# Patient Record
Sex: Female | Born: 1935 | Race: Black or African American | Hispanic: No | Smoking: Former smoker
Health system: Southern US, Community
[De-identification: ages and names within clinical notes are randomized; demographics above are authoritative.]

## PROBLEM LIST (undated history)

## (undated) DIAGNOSIS — Z992 Dependence on renal dialysis: Secondary | ICD-10-CM

## (undated) DIAGNOSIS — E785 Hyperlipidemia, unspecified: Secondary | ICD-10-CM

## (undated) DIAGNOSIS — N186 End stage renal disease: Secondary | ICD-10-CM

## (undated) DIAGNOSIS — K5900 Constipation, unspecified: Secondary | ICD-10-CM

## (undated) DIAGNOSIS — I509 Heart failure, unspecified: Secondary | ICD-10-CM

## (undated) DIAGNOSIS — I10 Essential (primary) hypertension: Secondary | ICD-10-CM

## (undated) DIAGNOSIS — J449 Chronic obstructive pulmonary disease, unspecified: Secondary | ICD-10-CM

## (undated) DIAGNOSIS — D649 Anemia, unspecified: Secondary | ICD-10-CM

## (undated) DIAGNOSIS — K219 Gastro-esophageal reflux disease without esophagitis: Secondary | ICD-10-CM

## (undated) DIAGNOSIS — I469 Cardiac arrest, cause unspecified: Secondary | ICD-10-CM

## (undated) HISTORY — DX: Anemia, unspecified: D64.9

## (undated) HISTORY — DX: Essential (primary) hypertension: I10

## (undated) HISTORY — PX: ABDOMINAL HYSTERECTOMY: SHX81

## (undated) HISTORY — DX: Hyperlipidemia, unspecified: E78.5

## (undated) HISTORY — DX: Chronic obstructive pulmonary disease, unspecified: J44.9

---

## 2008-03-31 ENCOUNTER — Ambulatory Visit: Payer: Self-pay | Admitting: *Deleted

## 2008-07-12 ENCOUNTER — Ambulatory Visit: Payer: Self-pay | Admitting: Internal Medicine

## 2008-07-12 DIAGNOSIS — I10 Essential (primary) hypertension: Secondary | ICD-10-CM

## 2008-07-12 DIAGNOSIS — J4489 Other specified chronic obstructive pulmonary disease: Secondary | ICD-10-CM | POA: Insufficient documentation

## 2008-07-12 DIAGNOSIS — R042 Hemoptysis: Secondary | ICD-10-CM | POA: Insufficient documentation

## 2008-07-12 DIAGNOSIS — J438 Other emphysema: Secondary | ICD-10-CM | POA: Insufficient documentation

## 2008-07-12 DIAGNOSIS — J449 Chronic obstructive pulmonary disease, unspecified: Secondary | ICD-10-CM

## 2008-08-09 ENCOUNTER — Ambulatory Visit: Payer: Self-pay | Admitting: Internal Medicine

## 2008-09-08 ENCOUNTER — Ambulatory Visit: Payer: Self-pay | Admitting: *Deleted

## 2008-10-11 ENCOUNTER — Ambulatory Visit: Payer: Self-pay | Admitting: Internal Medicine

## 2008-10-11 DIAGNOSIS — R0609 Other forms of dyspnea: Secondary | ICD-10-CM

## 2008-10-11 DIAGNOSIS — R0989 Other specified symptoms and signs involving the circulatory and respiratory systems: Secondary | ICD-10-CM

## 2008-10-11 LAB — CONVERTED CEMR LAB
BUN: 21 mg/dL (ref 6–23)
Basophils Absolute: 0 10*3/uL (ref 0.0–0.1)
Basophils Relative: 0 % (ref 0.0–3.0)
CO2: 38 meq/L — ABNORMAL HIGH (ref 19–32)
Calcium: 9.5 mg/dL (ref 8.4–10.5)
Chloride: 103 meq/L (ref 96–112)
Creatinine, Ser: 1.3 mg/dL — ABNORMAL HIGH (ref 0.4–1.2)
Eosinophils Absolute: 0.1 10*3/uL (ref 0.0–0.7)
GFR calc Af Amer: 51 mL/min
GFR calc non Af Amer: 42 mL/min
Glucose, Bld: 111 mg/dL — ABNORMAL HIGH (ref 70–99)
HCT: 36.6 % (ref 36.0–46.0)
Hemoglobin: 12 g/dL (ref 12.0–15.0)
Lymphocytes Relative: 35.8 % (ref 12.0–46.0)
MCHC: 32.9 g/dL (ref 30.0–36.0)
MCV: 90.5 fL (ref 78.0–100.0)
Monocytes Relative: 7.7 % (ref 3.0–12.0)
Platelets: 174 10*3/uL (ref 150–400)
Potassium: 4.5 meq/L (ref 3.5–5.1)
RBC: 4.05 M/uL (ref 3.87–5.11)
Sodium: 144 meq/L (ref 135–145)
TSH: 0.67 microintl units/mL (ref 0.35–5.50)

## 2009-03-02 ENCOUNTER — Ambulatory Visit: Payer: Self-pay | Admitting: *Deleted

## 2009-09-14 ENCOUNTER — Ambulatory Visit: Payer: Self-pay | Admitting: Vascular Surgery

## 2010-03-09 ENCOUNTER — Ambulatory Visit: Payer: Self-pay | Admitting: Vascular Surgery

## 2010-03-09 ENCOUNTER — Encounter: Payer: Self-pay | Admitting: Internal Medicine

## 2010-03-16 ENCOUNTER — Encounter: Payer: Self-pay | Admitting: Internal Medicine

## 2010-03-16 ENCOUNTER — Encounter: Admission: RE | Admit: 2010-03-16 | Discharge: 2010-03-16 | Payer: Self-pay | Admitting: Vascular Surgery

## 2010-03-16 ENCOUNTER — Ambulatory Visit: Payer: Self-pay | Admitting: Vascular Surgery

## 2010-10-02 ENCOUNTER — Ambulatory Visit
Admission: RE | Admit: 2010-10-02 | Discharge: 2010-10-02 | Payer: Self-pay | Source: Home / Self Care | Attending: Vascular Surgery | Admitting: Vascular Surgery

## 2010-10-02 ENCOUNTER — Ambulatory Visit: Admit: 2010-10-02 | Payer: Self-pay | Admitting: Vascular Surgery

## 2010-10-23 NOTE — Letter (Signed)
Summary: [facility]   Imported By: Sherian Rein 03/22/2010 16:03:16  _____________________________________________________________________  External Attachment:    Type:   Image     Comment:   External Document

## 2010-10-23 NOTE — Letter (Signed)
Summary: [facility]  [facility] [place]   Imported By: Sherian Rein 04/03/2010 09:53:24  _____________________________________________________________________  External Attachment:    Type:   Image     Comment:   External Document

## 2011-02-05 NOTE — Procedures (Signed)
DUPLEX ULTRASOUND OF ABDOMINAL AORTA   INDICATION:  Abdominal aortic aneurysm.   HISTORY:  Diabetes:  No  Cardiac:  No  Hypertension:  Yes  Smoking:  Previous  Connective Tissue Disorder:  Family History:  Yes  Previous Surgery:  No   DUPLEX EXAM:         AP (cm)                   TRANSVERSE (cm)  Proximal             2.83 cm                   2.80 cm  Mid                  Not visualized            Not visualized  Distal               5.24 cm                   5.66 cm  Right Iliac          Not visualized            Not visualized  Left Iliac           Not visualized            Not visualized   PREVIOUS:  Date:  March 09, 2010  AP:  5.3  TRANSVERSE:  5.6   IMPRESSION:  1. Aneurysmal dilatation of the distal abdominal aorta.  Noted was a      measurement of 5.24 x 5.66 cm.  2. Unable to adequately visualize the mid-abdominal aorta and common      iliac arteries due to bowel gas and patient body habitus.   ___________________________________________  Larina Earthly, M.D.   EM/MEDQ  D:  10/02/2010  T:  10/02/2010  Job:  [id number]

## 2011-02-05 NOTE — Procedures (Signed)
DUPLEX ULTRASOUND OF ABDOMINAL AORTA   INDICATION:  Followup abdominal aortic aneurysm.   HISTORY:  Diabetes:  No.  Cardiac:  No.  Hypertension:  Yes.  Smoking:  No.  Connective Tissue Disorder:  Family History:  Previous Surgery:   DUPLEX EXAM:         AP (cm)                   TRANSVERSE (cm)  Proximal             3.18 cm                   3.14 cm  Mid                  4.33 cm                   4.37 cm  Distal               3.11 cm                   3.06 cm  Right Iliac          1.39 cm                   1.59 cm  Left Iliac           1.39 cm                   1.49 cm   PREVIOUS:  Date:  AP:  4.5  TRANSVERSE:  4.2 (per CT)   IMPRESSION:  Abdominal aortic aneurysm noted with the largest  measurement of 4.33 cm x 4.37 cm.   ___________________________________________  P. Liliane Bade, M.D.   MG/MEDQ  D:  09/08/2008  T:  09/08/2008  Job:  [id number]

## 2011-02-05 NOTE — Assessment & Plan Note (Signed)
OFFICE VISIT   [name], [name]  DOB:  05/24/1929                                       09/08/2008  ZOXWR#:[id number]   The patient has a stable abdominal aortic aneurysm measuring 4.3 cm in  maximal diameter.  This has not changed over the past 6 months.   Note is made of a CT report with a left lower quadrant cyst measuring  3.7 cm by 2.6 cm.  The patient is asked to follow up with Dr. Abner Greenspan regarding this.  Otherwise, follow-up with [place] in 6 months with  repeat abdominal ultrasound.   Balinda Quails, M.D.  Electronically Signed   PGH/MEDQ  D:  09/08/2008  T:  09/09/2008  Job:  [id number]   cc:   Grant Fontana, MD

## 2011-02-05 NOTE — Procedures (Signed)
DUPLEX ULTRASOUND OF ABDOMINAL AORTA   INDICATION:  Abdominal aortic aneurysm.   HISTORY:  Diabetes:  No.  Cardiac:  No.  Hypertension:  Yes.  Smoking:  Previous.  Connective Tissue Disorder:  Family History:  No.  Previous Surgery:  No.   DUPLEX EXAM:         AP (cm)                   TRANSVERSE (cm)  Proximal             3.0 cm                    3.3 cm  Mid                  2.3 cm                    2.5 cm  Distal               5.3 cm                    5.6 cm  Right Iliac          Not visualized            Not visualized  Left Iliac           Not visualized            Not visualized   PREVIOUS:  Date: 09/14/2009  AP:  4.87  TRANSVERSE:  4.76   IMPRESSION:  1. Aneurysmal dilatation of the distal abdominal aorta noted with a      significant increase in maximum diameter when compared to the      previous examination.  2. Unable to adequately visualize the bilateral common iliac arteries      due to overlying bowel gas patterns and patient body habitus.   ___________________________________________  Larina Earthly, M.D.   CH/MEDQ  D:  03/09/2010  T:  03/09/2010  Job:  [id number]

## 2011-02-05 NOTE — Assessment & Plan Note (Signed)
OFFICE VISIT   [name], [name]  DOB:  Dec 02, 1928                                       03/09/2010  [id number]#:[id number]   The patient presents today for continued followup of her infrarenal  abdominal aortic aneurysm.  She had seen Dr. Liliane Bade for evaluation  of this initially in July of 2009 with one subsequent followup visit in  December of 2009.  I explained to the patient that Dr. Madilyn Fireman has left  our practice and I will assume her care.  She underwent ultrasound today  for continued evaluation of her infrarenal abdominal aortic aneurysm.  She does not have any symptoms referable to her aneurysm.  She does have  severe pulmonary compromise on continuous oxygen therapy.  She does not  have any known history of coronary artery disease.  She does have  hypertension, hyperlipidemia, COPD and a history of anemia.   Her social history is that she is widowed with three children.  She quit  smoking in 2007.  Does not drink alcohol on a regular basis.   Family history is positive for myocardial infarction in her mother.   REVIEW OF SYSTEMS:  Reports weight gain.  She is 5 feet 4 inches and  weighs 223 pounds.  VASCULAR:  Pain in her feet when lying flat.  She does have shortness of  breath with exertion and lying flat.  GU:  Positive for reflux.  NEUROLOGIC:  Negative.  PULMONARY:  Home oxygen and wheezing.  HEMATOLOGIC:  Negative.  URINARY:  Frequent urination.  HEENT:  Negative.  MUSCULOSKELETAL:  Positive for arthritis, joint pain, muscle pain.  PSYCHIATRIC:  Negative.   PHYSICAL EXAMINATION:  General:  An obese black female appearing her  stated age in no acute distress.  Vital signs:  Blood pressure is  114/65, pulse 70, respirations 18.  Her O2 saturations are 98% on  supplemental oxygen.  HEENT:  Normal.  Chest:  Is clear bilaterally.  Heart:  Regular rate and rhythm.  Carotid arteries without bruits  bilaterally.  She has palpable radial and palpable  femoral pulses.  She  has absent popliteal pulses bilaterally.  Abdomen:  Her abdomen is  obese.  I do not feel an aneurysm.  Musculoskeletal:  Shows no deformity  or cyanosis.  Neurological:  No focal weakness, paresthesias.  Skin:  Without ulcers or rashes.   She underwent noninvasive vascular laboratory studies in our office  today with repeat ultrasound.  This shows progression from her aneurysm  size up to 5.6 significantly larger than her most recent December which  was 4.8.  I had a long discussion with the patient, her daughter and  friend present.  I explained that this is quite concerning both in the  rapidity of growth and the fact that it has reached a size of over 5.5  cm.  I did review her CT scan from several years ago and at least at  that time she appeared to be a candidate for stent grafting.  I  explained that she would need a repeat CT angiogram to further evaluate  her aorta to determine if she is indeed this size and if so would  recommend elective repair.  I explained that we would have her see Dr.  Sandrea Hughs again for pulmonary risk stratification and optimization  prior to planned surgery and  that she would also require cardiac  evaluation to rule out silent cardiac disease.  We will see her again  with a CT scan for further discussion and planning.  She does have  claudication symptoms and apparently underwent what looks like a prior  left fem-pop bypass several years ago at [facility].  We will  also check her ankle arm indices with her next visit.     [name], M.D.  Electronically Signed   TFE/MEDQ  D:  03/09/2010  T:  03/09/2010  Job:  [id number]   cc:   Dr Molli Knock B. Sherene Sires, MD, FCCP

## 2011-02-05 NOTE — Assessment & Plan Note (Signed)
OFFICE VISIT   [name], [name]  DOB:  [birth year]                                       10/02/2010  [id number]#:[id number]   Patient presents today for continued follow-up of her infrarenal  abdominal aortic aneurysm.  She has been quite stable since my last  visit with her.  She does have chronic shortness of breath, even at  rest, and this is no change.  She is not [name] current smoker.  She does  have hypertension.  Review of systems is otherwise unchanged.   PHYSICAL EXAMINATION:  [name] well-developed, moderately obese black female  in no acute distress.  Blood pressure is 132/66, pulse 76, respirations  24.  Her radial and dorsalis pedis pulses are 2+ bilaterally.  Abdominal  exam reveals obesity, and I feel the aneurysm.  Femoral pulses are 2+.   She underwent repeat noninvasive vascular laboratory studies in our  office.  This reveals [name] somewhat technically difficult study due to body  habitus and bowel gas.  Her predicted maximal diameter is 5.6 cm, which  is unchanged from her ultrasound study in June 2011.  At that time she  underwent [name] CT scan, and this revealed tortuosity and actual diameter of  4.28 cm.   I discussed the significance of this with patient and her friend  present.  I explained that since her ultrasound evaluation is the same  as it had been with [name] CT showing [name] less-than 5-cm aneurysm, I would  recommend continued close follow-up.  She understands the symptoms of  leaking aneurysm and knows to report immediately to the emergency room  should this occur, otherwise we will see her in 6 months for repeat  ultrasound.     Larina Earthly, M.D.  Electronically Signed   TFE/MEDQ  D:  10/02/2010  T:  10/02/2010  Job:  [id number]   cc:   Dr. [name]

## 2011-02-05 NOTE — Procedures (Signed)
DUPLEX ULTRASOUND OF ABDOMINAL AORTA   INDICATION:  Followup abdominal aortic aneurysm.   HISTORY:  Diabetes:  No.  Cardiac:  No.  Hypertension:  Yes.  Smoking:  Previous.  Connective Tissue Disorder:  Family History:  No.  Previous Surgery:  No.   DUPLEX EXAM:         AP (cm)                   TRANSVERSE (cm)  Proximal             2.60 cm  Mid                  4.87 cm                   4.76 cm  Distal               3.11 cm  Right Iliac          1.29 cm  Left Iliac           1.33 cm   PREVIOUS:  Date:  03/02/2009  AP:  4.6  TRANSVERSE:  4.7   IMPRESSION:  1. Stable abdominal aortic aneurysm with largest measurement of 4.87      cm x 4.76 cm.  2. Limited visualization due to body habitus and bowel gas.   ___________________________________________  Larina Earthly, M.D.   AS/MEDQ  D:  09/14/2009  T:  09/14/2009  Job:  [id number]

## 2011-02-05 NOTE — Consult Note (Signed)
VASCULAR SURGERY CONSULTATION   [name], [name]  DOB:  11/19/1928                                       03/31/2008  EAVWU#:[id number]   REASON FOR CONSULTATION:  Abdominal aortic aneurysm.   HISTORY:  The patient is a 77 year old female who is referred for  evaluation of an abdominal aortic aneurysm.  This was discovered at the  time of CT scan carried out of the left lower quadrant for abdominal  pain associated with nausea.  CT revealed a 4.5 x 4.2 infrarenal  abdominal aortic aneurysm.  Also noted in the pelvis was a left lower  quadrant cystic lesion measuring 3.7 x 2.6 cm.   The patient denies pain at this time.  No back pain.  No abdominal pain.   Risk factors for aneurysm disease include hypertension and tobacco  abuse.   PAST MEDICAL HISTORY:  1. Hypertension.  2. Hyperlipidemia.  3. COPD.  4. Anemia.   MEDICATIONS:  Plavix 75 mg daily, Lipitor 20 mg daily, quinapril  hydrochlorothiazide 20/12.5 one tablet daily, Lasix 20 mg daily,  theophylline 200 mg b.i.d. aspirin 81 mg daily, Pulmicort 0.5 mg b.i.d.,  colchicine 0.6 mg t.i.d., ProAir HFA 90 mcg 2 puffs q.6 h, and albuterol  inhaler p.r.n.   ALLERGIES:  None known.   SOCIAL HISTORY:  The patient is widowed.  She has 2 children and an  adopted child.  She is retired from a Museum/gallery curator.  Does not smoke  at present.  Did smoke a half a pack of cigarettes for up to 45 years.  Discontinued tobacco use in 2006.  Is now on home supplemental oxygen.  No regular alcohol use.   FAMILY HISTORY:  Mother died at age 104 of coronary artery disease and an  MI.  Father died in his 30s from alcohol abuse with cirrhosis.  One  sister in her 85s has had a stroke.   REVIEW OF SYSTEMS:  Refer to patient encounter form.  The patient has  had some recent weight loss.  Has a history of home oxygen use chronic  bronchitis and wheezing.  Recurrent abdominal pain.  Urinary frequency.  Joint discomfort.   PHYSICAL  EXAM:  An obese 77 year old African American female.  Supplemental oxygen.  No acute distress.  Alert and oriented.  Vital  Signs:  BP 140/82, pulse 78 per minute, respirations 20 per minute.  HEENT:  Mouth and throat are clear.  Normocephalic.  Extraocular  movements intact.  Neck:  Supple.  No thyromegaly or adenopathy.  Cardiovascular:  No carotid bruits.  Normal heart sounds without  murmurs.  No gallops or rubs.  Regular rate and rhythm.  Chest:  Equal  air entry bilaterally.  Scattered wheeze.  No rales.  Distant breath  sounds.  Abdomen:  Soft, nontender.  No masses or organomegaly.  Normal  bowel sounds without bruits.  Lower Extremities:  2+ femoral pulses.  No  popliteal, posterior tibial, or dorsalis pedis pulses.  No ankle edema.  Full range of motion.  Neurologic:  Cranial nerves intact.  Strength  equal bilaterally.  1+ reflexes.  Skin:  Warm, dry, intact.  No  ulceration or rash.   IMPRESSION:  1. A 4.5 cm abdominal aortic aneurysm.  2. Cystic abdominal mass 3.7 x 2.6 cm.  3. Chronic obstructive pulmonary disease.  4. Hypertension.  5. Hyperlipidemia.  6. Anemia.   RECOMMENDATIONS:  The patient has a moderate-sized abdominal aortic  aneurysm, which is asymptomatic.  Will require followup in 6 months with  an ultrasound.   Recommend follow up for intra-abdominal cyst with CT scan in 6 months  per primary care.   Continue current regimen for chronic medical conditions.   INVESTIGATIONS:  CT scan reveals.   Balinda Quails, M.D.  Electronically Signed  PGH/MEDQ  D:  03/31/2008  T:  04/01/2008  Job:  [id number]

## 2011-02-05 NOTE — Procedures (Signed)
DUPLEX ULTRASOUND OF ABDOMINAL AORTA   INDICATION:  Followup evaluation of known abdominal aortic aneurysm.   HISTORY:  Diabetes:  No.  Cardiac:  No.  Hypertension:  Yes.  Smoking:  No.  Connective Tissue Disorder:  Family History:  Previous Surgery:   DUPLEX EXAM:         AP (cm)                   TRANSVERSE (cm)  Proximal             2.7 cm                    2.7 cm  Mid                  4.6 cm                    4.7 cm  Distal               2.9 cm                    3.3 cm  Right Iliac          1.3 cm                    1.4 cm  Left Iliac           0.8 cm                    1.4 cm   PREVIOUS:  Date:  AP:  4.3  TRANSVERSE:  4.4   IMPRESSION:  Abdominal aortic aneurysm is stable.   ___________________________________________  P. Liliane Bade, M.D.   MC/MEDQ  D:  03/02/2009  T:  03/02/2009  Job:  [id number]

## 2011-02-05 NOTE — Assessment & Plan Note (Signed)
OFFICE VISIT   Mccalister, [name]  DOB:  1929/04/18                                       03/16/2010  BJYNW#:[id number]   Patient presents today for discussion regarding her CT scan for further  evaluation of abdominal aortic aneurysm.  I had seen her in follow-up in  our office on 06/17.  Ultrasound at that time suggested a significant  increase in the size of her aneurysm.  She has undergone a CT scan today  for further evaluation of this.   She has had no change in her physical exam.  She is an obese black  female with shortness of breath on oxygen therapy.  Blood pressure today  is 123/60, pulse 95, respirations 18.  Her saturations are 91% on  supplemental oxygen. Abdominal exam reveals obesity and tenderness.   I have reviewed her CAT scan and discussed this with patient and her  friend present.  She does have a great deal of tortuosity in her  infrarenal aorta.  She has a long segment of aorta that is not  aneurysmal and then has an aneurysm that extends down to the aortic  bifurcation.  Due to the tortuosity, axial cuts overestimate her level  of aneurysmal dilatation due to taking these measurements on a skew.  In  looking at her sagittal, coronal, and reformatted images, it is clear  that her maximal diameter is approximately 4.8 cm.   I discussed this at length with the patient and her friend present.  I  explained that this is certainly below the threshold of where we would  recommend any intervention and do not see that this is representing a  rapid enlargement.  She was reassured with this, and we will see her  again with ultrasound in 1 year.  I did explain that due to do her body  habitus and tortuosity, ultrasound is somewhat difficult, but I feel  that it is appropriate to follow this with ultrasound and would repeat a  CT scan only if there appeared to be a large change in her aneurysm  size.     Larina Earthly, M.D.  Electronically  Signed   TFE/MEDQ  D:  03/16/2010  T:  03/16/2010  Job:  [id number]   cc:   Dr. Molli Knock B. Sherene Sires, MD, FCCP

## 2011-03-26 ENCOUNTER — Ambulatory Visit (INDEPENDENT_AMBULATORY_CARE_PROVIDER_SITE_OTHER): Payer: Medicare Other | Admitting: Vascular Surgery

## 2011-03-26 ENCOUNTER — Encounter (INDEPENDENT_AMBULATORY_CARE_PROVIDER_SITE_OTHER): Payer: Medicare Other

## 2011-03-26 DIAGNOSIS — I714 Abdominal aortic aneurysm, without rupture, unspecified: Secondary | ICD-10-CM

## 2011-03-26 NOTE — Assessment & Plan Note (Signed)
OFFICE VISIT  [name], [name] DOB:  02/08/1929                                       03/26/2011 ZOXWR#:[id number]  The patient presents today for continued followup of her infrarenal abdominal aortic aneurysm.  She is here with her daughter and a family friend.  She does have severe pulmonary dysfunction on continuous oxygen therapy.  She was hospitalized several months ago with pulmonary deterioration and had rehab stay after this.  She reports that she feels she is back to her baseline.  She does not have any symptoms referable to the aneurysm and has had no other major medical difficulties.  PHYSICAL EXAM:  General:  Today she is a well-developed, well-nourished, alert black female in no acute distress.  Vital signs:  Blood pressure is 149/91, pulse 79, respirations 22.  Abdomen:  Reveals obesity.  I do not feel her aneurysm.  She does have palpable femoral pulses bilaterally.  She underwent ultrasound today and this shows maximal diameter of 4.7 which is consistent with the CT scan from a year ago citing 4.9.  We have had some variability with ultrasounds suggesting upwards of 5.7 cm but CT scan at the same time showing a smaller prediction of her aneurysm.  I have discussed this today with the patient and reassured her with this.  We plan to see her again in 6 months with continued ultrasound at followup.  I did discuss symptoms of leaking aneurysm and that she is to report immediately to [facility] via EMS should this occur.    [name], M.D. Electronically Signed  TFE/MEDQ  D:  03/26/2011  T:  03/26/2011  Job:  [id number]  cc:   [name]

## 2011-04-04 NOTE — Procedures (Unsigned)
DUPLEX ULTRASOUND OF ABDOMINAL AORTA  INDICATION:  Abdominal aortic aneurysm.  HISTORY: Diabetes:  No. Cardiac:  No. Hypertension:  Yes. Smoking:  Previous. Connective Tissue Disorder: Family History:  Yes. Previous Surgery:  No.  DUPLEX EXAM:         AP (cm)                   TRANSVERSE (cm) Proximal             2.86 cm                   2.83 cm Mid                  4.72 cm                   4.68 cm Distal               Not visualized            Not visualized Right Iliac          Not visualized            Not visualized Left Iliac           Not visualized            Not visualized  PREVIOUS:  Date:  10/02/2010  AP:  5.24  TRANSVERSE:  5.66  IMPRESSION: 1. Abdominal aortic aneurysm noted with largest measurement today of     4.72 x 4.68 cm. 2. Limited visualization due to overlying bowel gas and the patient's     body habitus.  ___________________________________________ Larina Earthly, M.D.  EM/MEDQ  D:  03/29/2011  T:  03/29/2011  Job:  [id number]

## 2011-09-26 ENCOUNTER — Encounter (INDEPENDENT_AMBULATORY_CARE_PROVIDER_SITE_OTHER): Payer: Medicare Other | Admitting: *Deleted

## 2011-09-26 ENCOUNTER — Other Ambulatory Visit: Payer: Self-pay | Admitting: Vascular Surgery

## 2011-09-26 ENCOUNTER — Ambulatory Visit (INDEPENDENT_AMBULATORY_CARE_PROVIDER_SITE_OTHER): Payer: Medicare Other | Admitting: Vascular Surgery

## 2011-09-26 DIAGNOSIS — I714 Abdominal aortic aneurysm, without rupture, unspecified: Secondary | ICD-10-CM | POA: Insufficient documentation

## 2011-09-26 NOTE — Progress Notes (Signed)
The patient presents today for continued followup of her known infrarenal abdominal aortic aneurysm. She was to have a surveillance followup ultrasound today. He continues to have difficulty with shortness of breath with exertion and is on continuous oxygen therapy. She has no cardiac difficulties.  No past medical history on file.  History  Substance Use Topics  . Smoking status: Not on file  . Smokeless tobacco: Not on file  . Alcohol Use: Not on file    No family history on file.  Not on File  No current outpatient prescriptions on file.  There were no vitals taken for this visit.  There is no height or weight on file to calculate BMI.       Physical exam elderly black female in no distress on oxygen therapy. She is able to climb onto it off of the exam table with some difficulty due to arthritic issues. No abdominal tenderness.  Vascular lab: She does have increased in size by ultrasound to 6.3 upper infrarenal abdominal aortic aneurysm. One half years ago she had had an over interpretation due to some difficulty with body habitus. He does have limited visualization due to body habitus and overlying bowel gas.  Impression and plan: A potential enlargement of her aneurysm by ultrasound. She is certainly at significant risk for surgical treatment due to her pulmonary status. I recommend that we proceed with a CT scan to compare with the June of 2011 study to determine if she indeed has had increased in size. We will schedule this in several weeks and see her at that time

## 2011-10-02 NOTE — Procedures (Unsigned)
DUPLEX ULTRASOUND OF ABDOMINAL AORTA  INDICATION:  AAA  HISTORY: Diabetes:  no Cardiac:  no Hypertension:  yes Smoking:  previous Connective Tissue Disorder: Family History:  yes Previous Surgery:  no  DUPLEX EXAM:         AP (cm)                   TRANSVERSE (cm) Proximal             2.36 cm                   2.32 cm Mid                  6.30 cm                   Not visualized Distal               Not visualized            Not visualized Right Iliac          Not visualized Left Iliac           Not visualized.  PREVIOUS:  Date: 03/26/2011  AP:  4.72  TRANSVERSE:  4.68 Date: 10/02/2010  AP:  5.24  TRANSVERSE: 5.66  IMPRESSION: 1. Tortuous and irregularly shaped abdominal aortic aneurysm with     measurements ranging from 2.36 cm to 6.30 cm. 2. Difficult study and limited visualization due to patient's body     habitus and overlying bowel gas.  ___________________________________________ Larina Earthly, M.D.  EM/MEDQ  D:  09/26/2011  T:  09/26/2011  Job:  [id number]

## 2011-10-04 ENCOUNTER — Encounter: Payer: Self-pay | Admitting: Vascular Surgery

## 2011-10-07 ENCOUNTER — Encounter: Payer: Self-pay | Admitting: Vascular Surgery

## 2011-10-08 ENCOUNTER — Encounter: Payer: Self-pay | Admitting: Vascular Surgery

## 2011-10-08 ENCOUNTER — Ambulatory Visit (INDEPENDENT_AMBULATORY_CARE_PROVIDER_SITE_OTHER): Payer: Medicare Other | Admitting: Vascular Surgery

## 2011-10-08 ENCOUNTER — Ambulatory Visit
Admission: RE | Admit: 2011-10-08 | Discharge: 2011-10-08 | Disposition: A | Discharge status: Home / Self Care | Payer: Medicare Other | Source: Ambulatory Visit | Attending: Vascular Surgery | Admitting: Vascular Surgery

## 2011-10-08 VITALS — BP 176/98 | HR 89 | Resp 16 | Ht 65.0 in | Wt 183.0 lb

## 2011-10-08 DIAGNOSIS — I714 Abdominal aortic aneurysm, without rupture, unspecified: Secondary | ICD-10-CM | POA: Insufficient documentation

## 2011-10-08 MED ORDER — IOHEXOL 350 MG/ML SOLN
50.0000 mL | Freq: Once | INTRAVENOUS | Status: AC | PRN
  Administered 2011-10-08: 50 mL via INTRAVENOUS

## 2011-10-08 NOTE — Progress Notes (Signed)
Vascular and Vein Specialist of [facility]   Patient name: [name] MRN: [id number] DOB: [birth year] Sex: female   Referred DG:[id number]  Reason for referral:  Chief Complaint  Patient presents with  . AAA    f/up with CTA    HISTORY OF PRESENT ILLNESS: Known abdominal aortic aneurysm here for followup. She had been seen several weeks ago with an ultrasound suggesting a tensely enlarging aneurysm. This was a difficult study due to her body habitus and bowel gas. Her history is otherwise unchanged from her visit several weeks ago  Past Medical History  Diagnosis Date  . Hypertension   . Hyperlipidemia   . COPD (chronic obstructive pulmonary disease)   . Anemia     History reviewed. No pertinent past surgical history.  History   Social History  . Marital Status: Widowed    Spouse Name: N/A    Number of Children: N/A  . Years of Education: N/A   Occupational History  . Not on file.   Social History Main Topics  . Smoking status: Former Smoker    Types: Cigarettes    Quit date: 10/07/2006  . Smokeless tobacco: Not on file  . Alcohol Use: No  . Drug Use: No  . Sexually Active: Not on file   Other Topics Concern  . Not on file   Social History Narrative  . No narrative on file    History reviewed. No pertinent family history.  Allergies as of 10/08/2011  . (Not on File)    Current Outpatient Prescriptions on File Prior to Visit  Medication Sig Dispense Refill  . albuterol (PROVENTIL) (2.5 MG/3ML) 0.083% nebulizer solution Take 2.5 mg by nebulization every 6 (six) hours as needed.      Marland Kitchen allopurinol (ZYLOPRIM) 100 MG tablet       . aspirin 81 MG tablet Take 160 mg by mouth daily.      Marland Kitchen atorvastatin (LIPITOR) 20 MG tablet Take 20 mg by mouth daily.      . budesonide (PULMICORT) 0.5 MG/2ML nebulizer solution Take 0.5 mg by nebulization 2 (two) times daily.      Marland Kitchen BYSTOLIC 5 MG tablet       . clopidogrel (PLAVIX) 75 MG tablet Take 75 mg by mouth daily.        . colchicine 0.6 MG tablet Take 0.6 mg by mouth daily.      Marland Kitchen DALIRESP 500 MCG TABS tablet       . DETROL LA 4 MG 24 hr capsule       . furosemide (LASIX) 20 MG tablet Take 20 mg by mouth 2 (two) times daily.      . potassium chloride SA (K-DUR,KLOR-CON) 20 MEQ tablet       . quinapril-hydrochlorothiazide (ACCURETIC) 20-12.5 MG per tablet Take 1 tablet by mouth daily.      . theophylline (THEODUR) 100 MG 12 hr tablet Take 100 mg by mouth 2 (two) times daily.       No current facility-administered medications on file prior to visit.     REVIEW OF SYSTEMS:  Positives indicated with an "X"  CARDIOVASCULAR:  [ ]  chest pain   [ ]  chest pressure   [ ]  palpitations   [ ]  orthopnea   [ ]  dyspnea on exertion   [ ]  claudication   [ ]  rest pain   [ ]  DVT   [ ]  phlebitis PULMONARY:   [ ]  productive cough   [ ]  asthma   [ ]   wheezing NEUROLOGIC:   [ ]  weakness  [ ]  paresthesias  [ ]  aphasia  [ ]  amaurosis  [ ]  dizziness HEMATOLOGIC:   [ ]  bleeding problems   [ ]  clotting disorders MUSCULOSKELETAL:  [ ]  joint pain   [ ]  joint swelling GASTROINTESTINAL: [ ]   blood in stool  [ ]   hematemesis GENITOURINARY:  [ ]   dysuria  [ ]   hematuria PSYCHIATRIC:  [ ]  history of major depression INTEGUMENTARY:  [ ]  rashes  [ ]  ulcers CONSTITUTIONAL:  [ ]  fever   [ ]  chills  PHYSICAL EXAMINATION:  General: The patient is a well-nourished female, in no acute distress. Vital signs are BP 176/98  Pulse 89  Resp 16  Ht 5\' 5"  (1.651 m)  Wt 183 lb (83.008 kg)  BMI 30.45 kg/m2  SpO2 88% Pulmonary: There is a good air exchange bilaterally Abdomen: Soft and non-tender   Musculoskeletal: There are no major deformities.  There is no significant extremity pain. Neurologic: No focal weakness or paresthesias are detected, Skin: There are no ulcer or rashes noted. Psychiatric: The patient has normal affect.   CT scan Olen today's study was compared with a year and a half ago. This shows no increase in size she does  have some tortuosity appears that her actual diameter is partially 5.2 cm which is unchanged from her prior study. I discussed this with Ms. Yokoyama and her friend present. Would recommend seeing her at 6 month intervals with ultrasound to rule out progression in size  Impression and Plan:  Stable aneurysm in size. Continue followup with ultrasound surveillance    Yarel Kilcrease F [facility] Specialists of [place] Office: [phone]

## 2012-04-14 ENCOUNTER — Other Ambulatory Visit: Payer: Self-pay | Admitting: *Deleted

## 2012-04-14 DIAGNOSIS — I714 Abdominal aortic aneurysm, without rupture: Secondary | ICD-10-CM

## 2012-04-19 DIAGNOSIS — I469 Cardiac arrest, cause unspecified: Secondary | ICD-10-CM

## 2012-04-19 HISTORY — DX: Cardiac arrest, cause unspecified: I46.9

## 2012-04-21 ENCOUNTER — Ambulatory Visit: Payer: Medicare Other | Admitting: Neurosurgery

## 2012-06-02 HISTORY — PX: OTHER SURGICAL HISTORY: SHX169

## 2012-06-23 ENCOUNTER — Other Ambulatory Visit: Payer: Self-pay

## 2012-06-23 DIAGNOSIS — T82898A Other specified complication of vascular prosthetic devices, implants and grafts, initial encounter: Secondary | ICD-10-CM

## 2012-07-02 ENCOUNTER — Ambulatory Visit: Payer: Medicare Other | Admitting: Vascular Surgery

## 2012-07-14 HISTORY — PX: ARTERIOVENOUS GRAFT PLACEMENT: SUR1029

## 2012-08-27 ENCOUNTER — Other Ambulatory Visit (HOSPITAL_COMMUNITY): Payer: Self-pay | Admitting: Medical

## 2012-08-27 DIAGNOSIS — N186 End stage renal disease: Secondary | ICD-10-CM

## 2012-08-31 ENCOUNTER — Ambulatory Visit (HOSPITAL_COMMUNITY)
Admission: RE | Admit: 2012-08-31 | Discharge: 2012-08-31 | Disposition: A | Discharge status: Home / Self Care | Payer: Medicare Other | Source: Ambulatory Visit | Attending: Medical | Admitting: Medical

## 2012-08-31 ENCOUNTER — Encounter (HOSPITAL_COMMUNITY): Payer: Self-pay

## 2012-08-31 ENCOUNTER — Other Ambulatory Visit (HOSPITAL_COMMUNITY): Payer: Self-pay | Admitting: Medical

## 2012-08-31 VITALS — BP 135/83 | HR 101 | Temp 98.4°F | Resp 27

## 2012-08-31 DIAGNOSIS — I871 Compression of vein: Secondary | ICD-10-CM | POA: Insufficient documentation

## 2012-08-31 DIAGNOSIS — Z992 Dependence on renal dialysis: Secondary | ICD-10-CM | POA: Insufficient documentation

## 2012-08-31 DIAGNOSIS — I120 Hypertensive chronic kidney disease with stage 5 chronic kidney disease or end stage renal disease: Secondary | ICD-10-CM | POA: Insufficient documentation

## 2012-08-31 DIAGNOSIS — T82898A Other specified complication of vascular prosthetic devices, implants and grafts, initial encounter: Secondary | ICD-10-CM | POA: Insufficient documentation

## 2012-08-31 DIAGNOSIS — N186 End stage renal disease: Secondary | ICD-10-CM

## 2012-08-31 DIAGNOSIS — Y832 Surgical operation with anastomosis, bypass or graft as the cause of abnormal reaction of the patient, or of later complication, without mention of misadventure at the time of the procedure: Secondary | ICD-10-CM | POA: Insufficient documentation

## 2012-08-31 DIAGNOSIS — J4489 Other specified chronic obstructive pulmonary disease: Secondary | ICD-10-CM | POA: Insufficient documentation

## 2012-08-31 DIAGNOSIS — J449 Chronic obstructive pulmonary disease, unspecified: Secondary | ICD-10-CM | POA: Insufficient documentation

## 2012-08-31 HISTORY — DX: Constipation, unspecified: K59.00

## 2012-08-31 HISTORY — DX: Gastro-esophageal reflux disease without esophagitis: K21.9

## 2012-08-31 HISTORY — DX: End stage renal disease: N18.6

## 2012-08-31 HISTORY — DX: Cardiac arrest, cause unspecified: I46.9

## 2012-08-31 HISTORY — DX: End stage renal disease: Z99.2

## 2012-08-31 HISTORY — DX: Heart failure, unspecified: I50.9

## 2012-08-31 MED ORDER — FENTANYL CITRATE 0.05 MG/ML IJ SOLN
INTRAMUSCULAR | Status: AC
  Filled 2012-08-31: qty 4

## 2012-08-31 MED ORDER — MIDAZOLAM HCL 2 MG/2ML IJ SOLN
INTRAMUSCULAR | Status: DC | PRN
  Administered 2012-08-31: 0.5 mg via INTRAVENOUS
  Administered 2012-08-31: 1 mg via INTRAVENOUS
  Administered 2012-08-31: 0.5 mg via INTRAVENOUS

## 2012-08-31 MED ORDER — MIDAZOLAM HCL 2 MG/2ML IJ SOLN
INTRAMUSCULAR | Status: AC
  Filled 2012-08-31: qty 4

## 2012-08-31 MED ORDER — NALOXONE HCL 0.4 MG/ML IJ SOLN
INTRAMUSCULAR | Status: AC
  Filled 2012-08-31: qty 1

## 2012-08-31 MED ORDER — IOHEXOL 300 MG/ML  SOLN
100.0000 mL | Freq: Once | INTRAMUSCULAR | Status: AC | PRN
  Administered 2012-08-31: 50 mL via INTRAVENOUS

## 2012-08-31 MED ORDER — HEPARIN SODIUM (PORCINE) 1000 UNIT/ML IJ SOLN
INTRAMUSCULAR | Status: DC | PRN
  Administered 2012-08-31: 3000 [IU] via INTRAVENOUS

## 2012-08-31 MED ORDER — FENTANYL CITRATE 0.05 MG/ML IJ SOLN
INTRAMUSCULAR | Status: DC | PRN
  Administered 2012-08-31: 25 ug via INTRAVENOUS

## 2012-08-31 MED ORDER — HEPARIN SODIUM (PORCINE) 1000 UNIT/ML IJ SOLN
INTRAMUSCULAR | Status: AC
  Filled 2012-08-31: qty 1

## 2012-08-31 MED ORDER — ALTEPLASE 100 MG IV SOLR
2.0000 mg | Freq: Once | INTRAVENOUS | Status: DC
  Filled 2012-08-31: qty 2

## 2012-08-31 MED ORDER — FLUMAZENIL 0.5 MG/5ML IV SOLN
INTRAVENOUS | Status: AC
  Filled 2012-08-31: qty 5

## 2012-08-31 NOTE — H&P (Signed)
[name] is an 78 y.o. female.   Chief Complaint: clotted left upper arm AVG. HPI: Created 07/14/12 - only attempted use x 1 without success. Hx of non-matured left radio-cephalic AVF as well on same extremity. Has functioning HD catheter currently.   Past Medical History  Diagnosis Date  . Hypertension   . Hyperlipidemia   . COPD (chronic obstructive pulmonary disease)   . Anemia   . ESRD (end stage renal disease) on dialysis   . CHF (congestive heart failure)   . GERD (gastroesophageal reflux disease)   . Constipation   . PEA (Pulseless electrical activity) 04/19/2012    PEA arrest complicated by hyperkalemia, PNA, anocic encephalopathy, VDRF    Past Surgical History  Procedure Date  . Abdominal hysterectomy   . Arteriovenous fistula 06/02/12    left radiocephalic - failed  . Arteriovenous graft placement 07/14/12    Created at [facility]    No family history on file. Social History:  reports that she quit smoking about 5 years ago. Her smoking use included Cigarettes. She does not have any smokeless tobacco history on file. She reports that she does not drink alcohol or use illicit drugs.  Allergies: tape - latex.     Medication List     As of 08/31/2012  2:02 PM    ASK your doctor about these medications         albuterol (2.5 MG/3ML) 0.083% nebulizer solution   Commonly known as: PROVENTIL   Take 2.5 mg by nebulization every 6 (six) hours as needed.      allopurinol 100 MG tablet   Commonly known as: ZYLOPRIM      aspirin 81 MG tablet   Take 160 mg by mouth daily.      atorvastatin 20 MG tablet   Commonly known as: LIPITOR   Take 20 mg by mouth daily.      budesonide 0.5 MG/2ML nebulizer solution   Commonly known as: PULMICORT   Take 0.5 mg by nebulization 2 (two) times daily.      BYSTOLIC 5 MG tablet   Generic drug: nebivolol      clopidogrel 75 MG tablet   Commonly known as: PLAVIX   Take 75 mg by mouth daily.      colchicine 0.6 MG tablet    Take 0.6 mg by mouth daily.      DALIRESP 500 MCG Tabs tablet   Generic drug: roflumilast      DETROL LA 4 MG 24 hr capsule   Generic drug: tolterodine      furosemide 20 MG tablet   Commonly known as: LASIX   Take 20 mg by mouth 2 (two) times daily.      potassium chloride SA 20 MEQ tablet   Commonly known as: K-DUR,KLOR-CON      quinapril-hydrochlorothiazide 20-12.5 MG per tablet   Commonly known as: ACCURETIC   Take 1 tablet by mouth daily.      theophylline 100 MG 12 hr tablet   Commonly known as: THEODUR   Take 100 mg by mouth 2 (two) times daily.      VITAMINS A D C PO   Take by mouth.          Review of Systems  Constitutional: Positive for malaise/fatigue. Negative for fever and chills.  Eyes: Positive for blurred vision.  Respiratory: Positive for shortness of breath. Negative for cough and wheezing.   Cardiovascular: Positive for palpitations and leg swelling. Negative for chest pain.  Genitourinary:       ESRD  Musculoskeletal: Positive for joint pain.  Neurological: Positive for weakness.  Endo/Heme/Allergies: Bruises/bleeds easily.  Psychiatric/Behavioral: Negative for depression.    Blood pressure 134/41, pulse 66, temperature 98.4 F (36.9 C), temperature source Oral, SpO2 96.00%. Physical Exam  Constitutional: She is oriented to person, place, and time. She appears well-developed and well-nourished.  HENT:  Head: Normocephalic.       Arcus bilaterally    Cardiovascular: Exam reveals no gallop and no friction rub.   No murmur heard.      IR, IR   Respiratory: Effort normal. No respiratory distress. She has no wheezes. She has no rales. She exhibits no tenderness.       Decreased BS bilaterally   Musculoskeletal: Normal range of motion. She exhibits no edema.       Left arm AVF with clean scars - Left arm AVG with nonpalpable thrill - positive radial pulse noted. Ecchymosis noted. Non-tender upon palpation- ? Pseudoaneurysm vs hematoma    Neurological: She is alert and oriented to person, place, and time.  Skin: Skin is warm and dry.  Psychiatric: She has a normal mood and affect. Her behavior is normal. Judgment and thought content normal.     Assessment/Plan Discussed with patient and daughter in detail regarding declot procedure including procedure details, risks and benefits. Potential complications including but not limited to infection, bleeding, vessel damage and unsuccessful restoration of flow and possible complications with moderate sedation discussed with their apparent understanding. Written consent obtained. Patient has functioning HD catheter.   [name] D 08/31/2012, 1:56 PM

## 2012-08-31 NOTE — Procedures (Signed)
Declot L FA graft, venous PTA No complication No blood loss. See complete dictation in [facility].

## 2014-07-27 ENCOUNTER — Encounter (HOSPITAL_COMMUNITY): Payer: Self-pay | Admitting: General Practice

## 2014-07-27 ENCOUNTER — Inpatient Hospital Stay (HOSPITAL_COMMUNITY)
Admission: AD | Admit: 2014-07-27 | Discharge: 2014-08-05 | DRG: 208 | Disposition: A | Discharge status: Skilled Nursing Facility | Payer: Medicare Other | Source: Other Acute Inpatient Hospital | Attending: Internal Medicine | Admitting: Internal Medicine

## 2014-07-27 DIAGNOSIS — F0390 Unspecified dementia without behavioral disturbance: Secondary | ICD-10-CM | POA: Diagnosis present

## 2014-07-27 DIAGNOSIS — I482 Chronic atrial fibrillation: Secondary | ICD-10-CM | POA: Diagnosis present

## 2014-07-27 DIAGNOSIS — K219 Gastro-esophageal reflux disease without esophagitis: Secondary | ICD-10-CM | POA: Diagnosis not present

## 2014-07-27 DIAGNOSIS — J9602 Acute respiratory failure with hypercapnia: Secondary | ICD-10-CM | POA: Diagnosis present

## 2014-07-27 DIAGNOSIS — Z9981 Dependence on supplemental oxygen: Secondary | ICD-10-CM

## 2014-07-27 DIAGNOSIS — D631 Anemia in chronic kidney disease: Secondary | ICD-10-CM | POA: Diagnosis not present

## 2014-07-27 DIAGNOSIS — G9340 Encephalopathy, unspecified: Secondary | ICD-10-CM | POA: Diagnosis present

## 2014-07-27 DIAGNOSIS — N390 Urinary tract infection, site not specified: Secondary | ICD-10-CM | POA: Diagnosis present

## 2014-07-27 DIAGNOSIS — Z720 Tobacco use: Secondary | ICD-10-CM | POA: Diagnosis present

## 2014-07-27 DIAGNOSIS — Z87891 Personal history of nicotine dependence: Secondary | ICD-10-CM | POA: Diagnosis not present

## 2014-07-27 DIAGNOSIS — G92 Toxic encephalopathy: Secondary | ICD-10-CM | POA: Diagnosis present

## 2014-07-27 DIAGNOSIS — R229 Localized swelling, mass and lump, unspecified: Secondary | ICD-10-CM

## 2014-07-27 DIAGNOSIS — N186 End stage renal disease: Secondary | ICD-10-CM | POA: Diagnosis present

## 2014-07-27 DIAGNOSIS — E46 Unspecified protein-calorie malnutrition: Secondary | ICD-10-CM | POA: Diagnosis present

## 2014-07-27 DIAGNOSIS — J440 Chronic obstructive pulmonary disease with acute lower respiratory infection: Secondary | ICD-10-CM | POA: Diagnosis present

## 2014-07-27 DIAGNOSIS — J189 Pneumonia, unspecified organism: Principal | ICD-10-CM | POA: Diagnosis present

## 2014-07-27 DIAGNOSIS — R918 Other nonspecific abnormal finding of lung field: Secondary | ICD-10-CM | POA: Diagnosis present

## 2014-07-27 DIAGNOSIS — I5189 Other ill-defined heart diseases: Secondary | ICD-10-CM

## 2014-07-27 DIAGNOSIS — I10 Essential (primary) hypertension: Secondary | ICD-10-CM | POA: Diagnosis present

## 2014-07-27 DIAGNOSIS — E785 Hyperlipidemia, unspecified: Secondary | ICD-10-CM | POA: Diagnosis present

## 2014-07-27 DIAGNOSIS — J9600 Acute respiratory failure, unspecified whether with hypoxia or hypercapnia: Secondary | ICD-10-CM

## 2014-07-27 DIAGNOSIS — J9622 Acute and chronic respiratory failure with hypercapnia: Secondary | ICD-10-CM | POA: Diagnosis present

## 2014-07-27 DIAGNOSIS — I120 Hypertensive chronic kidney disease with stage 5 chronic kidney disease or end stage renal disease: Secondary | ICD-10-CM | POA: Diagnosis not present

## 2014-07-27 DIAGNOSIS — I953 Hypotension of hemodialysis: Secondary | ICD-10-CM | POA: Diagnosis not present

## 2014-07-27 DIAGNOSIS — R931 Abnormal findings on diagnostic imaging of heart and coronary circulation: Secondary | ICD-10-CM

## 2014-07-27 DIAGNOSIS — R0600 Dyspnea, unspecified: Secondary | ICD-10-CM

## 2014-07-27 DIAGNOSIS — J9690 Respiratory failure, unspecified, unspecified whether with hypoxia or hypercapnia: Secondary | ICD-10-CM

## 2014-07-27 DIAGNOSIS — N2581 Secondary hyperparathyroidism of renal origin: Secondary | ICD-10-CM | POA: Diagnosis present

## 2014-07-27 DIAGNOSIS — R410 Disorientation, unspecified: Secondary | ICD-10-CM | POA: Diagnosis present

## 2014-07-27 DIAGNOSIS — IMO0002 Reserved for concepts with insufficient information to code with codable children: Secondary | ICD-10-CM

## 2014-07-27 DIAGNOSIS — Z992 Dependence on renal dialysis: Secondary | ICD-10-CM | POA: Diagnosis not present

## 2014-07-27 DIAGNOSIS — J449 Chronic obstructive pulmonary disease, unspecified: Secondary | ICD-10-CM | POA: Diagnosis present

## 2014-07-27 DIAGNOSIS — J9610 Chronic respiratory failure, unspecified whether with hypoxia or hypercapnia: Secondary | ICD-10-CM | POA: Diagnosis present

## 2014-07-27 DIAGNOSIS — N3000 Acute cystitis without hematuria: Secondary | ICD-10-CM

## 2014-07-27 DIAGNOSIS — I509 Heart failure, unspecified: Secondary | ICD-10-CM | POA: Diagnosis present

## 2014-07-27 DIAGNOSIS — K5900 Constipation, unspecified: Secondary | ICD-10-CM | POA: Diagnosis not present

## 2014-07-27 DIAGNOSIS — I4891 Unspecified atrial fibrillation: Secondary | ICD-10-CM | POA: Diagnosis present

## 2014-07-27 DIAGNOSIS — Z8674 Personal history of sudden cardiac arrest: Secondary | ICD-10-CM

## 2014-07-27 LAB — RENAL FUNCTION PANEL
ANION GAP: 13 (ref 5–15)
Albumin: 2.8 g/dL — ABNORMAL LOW (ref 3.5–5.2)
BUN: 32 mg/dL — ABNORMAL HIGH (ref 6–23)
CHLORIDE: 95 meq/L — AB (ref 96–112)
CO2: 30 meq/L (ref 19–32)
CREATININE: 7.96 mg/dL — AB (ref 0.50–1.10)
Calcium: 7.5 mg/dL — ABNORMAL LOW (ref 8.4–10.5)
GFR calc Af Amer: 5 mL/min — ABNORMAL LOW (ref >90)
GFR calc non Af Amer: 4 mL/min — ABNORMAL LOW (ref >90)
Glucose, Bld: 127 mg/dL — ABNORMAL HIGH (ref 70–99)
POTASSIUM: 4.1 meq/L (ref 3.7–5.3)
Phosphorus: 6.9 mg/dL — ABNORMAL HIGH (ref 2.3–4.6)
Sodium: 138 mEq/L (ref 137–147)

## 2014-07-27 LAB — CBC
HCT: 36.2 % (ref 36.0–46.0)
HEMATOCRIT: 33.4 % — AB (ref 36.0–46.0)
HEMOGLOBIN: 10.6 g/dL — AB (ref 12.0–15.0)
HEMOGLOBIN: 9.7 g/dL — AB (ref 12.0–15.0)
MCH: 30 pg (ref 26.0–34.0)
MCH: 29.8 pg (ref 26.0–34.0)
MCHC: 29.3 g/dL — ABNORMAL LOW (ref 30.0–36.0)
MCHC: 29 g/dL — ABNORMAL LOW (ref 30.0–36.0)
MCV: 102.5 fL — AB (ref 78.0–100.0)
MCV: 102.8 fL — AB (ref 78.0–100.0)
PLATELETS: 194 10*3/uL (ref 150–400)
Platelets: 178 10*3/uL (ref 150–400)
RBC: 3.53 MIL/uL — AB (ref 3.87–5.11)
RBC: 3.25 MIL/uL — AB (ref 3.87–5.11)
RDW: 14.4 % (ref 11.5–15.5)
RDW: 14.3 % (ref 11.5–15.5)
WBC: 6.3 10*3/uL (ref 4.0–10.5)
WBC: 5.9 10*3/uL (ref 4.0–10.5)

## 2014-07-27 LAB — CREATININE, SERUM
Creatinine, Ser: 9.1 mg/dL — ABNORMAL HIGH (ref 0.50–1.10)
GFR calc non Af Amer: 4 mL/min — ABNORMAL LOW (ref >90)
GFR, EST AFRICAN AMERICAN: 4 mL/min — AB (ref >90)

## 2014-07-27 LAB — MRSA PCR SCREENING: MRSA BY PCR: NEGATIVE

## 2014-07-27 MED ORDER — ONDANSETRON HCL 4 MG PO TABS: 4.0000 mg | ORAL_TABLET | Freq: Four times a day (QID) | ORAL | Status: DC | PRN

## 2014-07-27 MED ORDER — TIOTROPIUM BROMIDE MONOHYDRATE 18 MCG IN CAPS
18.0000 ug | ORAL_CAPSULE | Freq: Every day | RESPIRATORY_TRACT | Status: DC
  Filled 2014-07-27: qty 5

## 2014-07-27 MED ORDER — ARFORMOTEROL TARTRATE 15 MCG/2ML IN NEBU
15.0000 ug | INHALATION_SOLUTION | Freq: Two times a day (BID) | RESPIRATORY_TRACT | Status: DC
  Administered 2014-07-28 – 2014-08-04 (×14): 15 ug via RESPIRATORY_TRACT
  Filled 2014-07-27 (×22): qty 2

## 2014-07-27 MED ORDER — LIDOCAINE-PRILOCAINE 2.5-2.5 % EX CREA: 1.0000 "application " | TOPICAL_CREAM | CUTANEOUS | Status: DC | PRN

## 2014-07-27 MED ORDER — ALTEPLASE 2 MG IJ SOLR
4.0000 mg | INTRAMUSCULAR | Status: AC
  Administered 2014-07-27: 4 mg
  Filled 2014-07-27: qty 4

## 2014-07-27 MED ORDER — PANTOPRAZOLE SODIUM 40 MG PO TBEC: 40.0000 mg | DELAYED_RELEASE_TABLET | Freq: Every day | ORAL | Status: DC

## 2014-07-27 MED ORDER — SODIUM CHLORIDE 0.9 % IV SOLN: 250.0000 mL | INTRAVENOUS | Status: DC | PRN

## 2014-07-27 MED ORDER — SODIUM CHLORIDE 0.9 % IV SOLN: 100.0000 mL | INTRAVENOUS | Status: DC | PRN

## 2014-07-27 MED ORDER — ASPIRIN EC 81 MG PO TBEC
81.0000 mg | DELAYED_RELEASE_TABLET | Freq: Every day | ORAL | Status: DC
  Filled 2014-07-27: qty 1

## 2014-07-27 MED ORDER — ACETAMINOPHEN 325 MG PO TABS: 650.0000 mg | ORAL_TABLET | Freq: Four times a day (QID) | ORAL | Status: DC | PRN

## 2014-07-27 MED ORDER — CHLORHEXIDINE GLUCONATE 0.12 % MT SOLN
15.0000 mL | Freq: Two times a day (BID) | OROMUCOSAL | Status: DC
  Administered 2014-07-28: 15 mL via OROMUCOSAL
  Filled 2014-07-27 (×4): qty 15

## 2014-07-27 MED ORDER — ALTEPLASE 2 MG IJ SOLR: 2.0000 mg | Freq: Once | INTRAMUSCULAR | Status: DC | PRN

## 2014-07-27 MED ORDER — BUDESONIDE 0.5 MG/2ML IN SUSP
0.5000 mg | Freq: Two times a day (BID) | RESPIRATORY_TRACT | Status: DC
  Administered 2014-07-28 – 2014-08-04 (×14): 0.5 mg via RESPIRATORY_TRACT
  Filled 2014-07-27 (×22): qty 2

## 2014-07-27 MED ORDER — ALBUTEROL SULFATE (2.5 MG/3ML) 0.083% IN NEBU: 2.5000 mg | INHALATION_SOLUTION | RESPIRATORY_TRACT | Status: DC | PRN

## 2014-07-27 MED ORDER — DEXTROSE 5 % IV SOLN
500.0000 mg | INTRAVENOUS | Status: DC
  Filled 2014-07-27: qty 0.5

## 2014-07-27 MED ORDER — ATORVASTATIN CALCIUM 20 MG PO TABS
20.0000 mg | ORAL_TABLET | Freq: Every day | ORAL | Status: DC
  Filled 2014-07-27: qty 1

## 2014-07-27 MED ORDER — ACETAMINOPHEN 650 MG RE SUPP: 650.0000 mg | Freq: Four times a day (QID) | RECTAL | Status: DC | PRN

## 2014-07-27 MED ORDER — COLCHICINE 0.6 MG PO TABS
0.3000 mg | ORAL_TABLET | Freq: Every day | ORAL | Status: DC
Start: 2014-07-28 — End: 2014-07-28
  Filled 2014-07-27: qty 0.5

## 2014-07-27 MED ORDER — LORAZEPAM 2 MG/ML IJ SOLN
INTRAMUSCULAR | Status: AC
  Filled 2014-07-27: qty 1

## 2014-07-27 MED ORDER — LORAZEPAM 2 MG/ML IJ SOLN: 1.0000 mg | Freq: Once | INTRAMUSCULAR | Status: DC

## 2014-07-27 MED ORDER — PENTAFLUOROPROP-TETRAFLUOROETH EX AERO: 1.0000 "application " | INHALATION_SPRAY | CUTANEOUS | Status: DC | PRN

## 2014-07-27 MED ORDER — VANCOMYCIN HCL IN DEXTROSE 750-5 MG/150ML-% IV SOLN
750.0000 mg | INTRAVENOUS | Status: DC
  Administered 2014-07-27: 750 mg via INTRAVENOUS

## 2014-07-27 MED ORDER — NEPRO/CARBSTEADY PO LIQD: 237.0000 mL | ORAL | Status: DC | PRN

## 2014-07-27 MED ORDER — LORAZEPAM 2 MG/ML IJ SOLN
0.5000 mg | Freq: Once | INTRAMUSCULAR | Status: AC
  Administered 2014-07-27: 0.5 mg via INTRAVENOUS

## 2014-07-27 MED ORDER — DEXTROSE 5 % IV SOLN
1.0000 g | Freq: Once | INTRAVENOUS | Status: DC
  Filled 2014-07-27: qty 1

## 2014-07-27 MED ORDER — LIDOCAINE HCL (PF) 1 % IJ SOLN: 5.0000 mL | INTRAMUSCULAR | Status: DC | PRN

## 2014-07-27 MED ORDER — DEXTROSE 5 % IV SOLN: 2.0000 g | INTRAVENOUS | Status: DC

## 2014-07-27 MED ORDER — ONDANSETRON HCL 4 MG/2ML IJ SOLN: 4.0000 mg | Freq: Four times a day (QID) | INTRAMUSCULAR | Status: DC | PRN

## 2014-07-27 MED ORDER — CETYLPYRIDINIUM CHLORIDE 0.05 % MT LIQD: 7.0000 mL | Freq: Two times a day (BID) | OROMUCOSAL | Status: DC

## 2014-07-27 MED ORDER — COLCHICINE 0.6 MG PO TABS
0.6000 mg | ORAL_TABLET | Freq: Every day | ORAL | Status: DC
Start: 2014-07-27 — End: 2014-07-27
  Filled 2014-07-27: qty 1

## 2014-07-27 MED ORDER — HEPARIN SODIUM (PORCINE) 1000 UNIT/ML DIALYSIS: 1000.0000 [IU] | INTRAMUSCULAR | Status: DC | PRN

## 2014-07-27 MED ORDER — ARFORMOTEROL TARTRATE 15 MCG/2ML IN NEBU
15.0000 ug | INHALATION_SOLUTION | Freq: Two times a day (BID) | RESPIRATORY_TRACT | Status: DC
Start: 2014-07-27 — End: 2014-07-27
  Filled 2014-07-27 (×2): qty 2

## 2014-07-27 MED ORDER — RENA-VITE PO TABS
1.0000 | ORAL_TABLET | Freq: Every day | ORAL | Status: DC
  Filled 2014-07-27: qty 1

## 2014-07-27 MED ORDER — AMIODARONE HCL 200 MG PO TABS
200.0000 mg | ORAL_TABLET | Freq: Every day | ORAL | Status: DC
  Filled 2014-07-27: qty 1

## 2014-07-27 MED ORDER — HEPARIN SODIUM (PORCINE) 1000 UNIT/ML DIALYSIS: 4000.0000 [IU] | INTRAMUSCULAR | Status: DC | PRN

## 2014-07-27 MED ORDER — IPRATROPIUM-ALBUTEROL 0.5-2.5 (3) MG/3ML IN SOLN
3.0000 mL | Freq: Three times a day (TID) | RESPIRATORY_TRACT | Status: DC
  Administered 2014-07-28: 3 mL via RESPIRATORY_TRACT
  Filled 2014-07-27: qty 3

## 2014-07-27 MED ORDER — HEPARIN SODIUM (PORCINE) 5000 UNIT/ML IJ SOLN
5000.0000 [IU] | Freq: Three times a day (TID) | INTRAMUSCULAR | Status: DC
  Administered 2014-07-28 – 2014-08-05 (×24): 5000 [IU] via SUBCUTANEOUS
  Filled 2014-07-27 (×32): qty 1

## 2014-07-27 MED ORDER — SODIUM CHLORIDE 0.9 % IJ SOLN: 3.0000 mL | INTRAMUSCULAR | Status: DC | PRN

## 2014-07-27 MED ORDER — VANCOMYCIN HCL IN DEXTROSE 1-5 GM/200ML-% IV SOLN
1000.0000 mg | Freq: Once | INTRAVENOUS | Status: DC
  Filled 2014-07-27: qty 200

## 2014-07-27 MED ORDER — OXYCODONE HCL 5 MG PO TABS
5.0000 mg | ORAL_TABLET | ORAL | Status: DC | PRN
Start: 2014-07-27 — End: 2014-07-28

## 2014-07-27 MED ORDER — GUAIFENESIN-DM 100-10 MG/5ML PO SYRP
5.0000 mL | ORAL_SOLUTION | ORAL | Status: DC | PRN
  Filled 2014-07-27: qty 5

## 2014-07-27 MED ORDER — SODIUM CHLORIDE 0.9 % IJ SOLN
3.0000 mL | Freq: Two times a day (BID) | INTRAMUSCULAR | Status: DC
  Administered 2014-07-29 – 2014-07-31 (×5): 3 mL via INTRAVENOUS

## 2014-07-27 NOTE — Progress Notes (Signed)
Pt HD tx ended w/ 50 mins left due to 2nd arterial system clot. Arterial port of catheter sluggish and has resistance. Cannot flush.  MD paged. TPA ordered. Will dwell overnight. Pt. Sleeping, vss.

## 2014-07-27 NOTE — H&P (Addendum)
PATIENT DETAILS Name: [name] PieriniRuth Roughton Age: 80 y.o. Sex: female Date of Birth: 01-Jul-1929 Admit Date: 07/27/2014 VWU:[id number]:HODGES,[name], MD   CHIEF COMPLAINT:  Confusion, weakness  HPI: [name] is a 80 y.o. female with a Past Medical History of End-stage renal disease,COPD on home O2, hypertension, atrial fibrillation, who was transferred [facility] for evaluation of the above-noted complaints. Please note, patient is confused, most of this history is obtained from the ED [facility] and also after briefly talking with the patient's daughter over the phone. Apparently for the past few weeks patient has got significantly worse in terms of worsening weakness, decreased appetite. Over the past few days she has gotten confused. Although dialysis patient, patient apparently still makes some urine, she went to the emergency room today because of significant weakness that prevented her from going to dialysis. Further evaluation revealed a urinary tract infection, patient was then [facility].  ALLERGIES:  No Known Allergies  PAST MEDICAL HISTORY: Past Medical History  Diagnosis Date  . Hypertension   . Hyperlipidemia   . COPD (chronic obstructive pulmonary disease)   . Anemia   . ESRD (end stage renal disease) on dialysis   . CHF (congestive heart failure)   . GERD (gastroesophageal reflux disease)   . Constipation   . PEA (Pulseless electrical activity) 04/19/2012    PEA arrest complicated by hyperkalemia, PNA, anocic encephalopathy, VDRF    PAST SURGICAL HISTORY: Past Surgical History  Procedure Laterality Date  . Abdominal hysterectomy    . Arteriovenous fistula  06/02/12    left radiocephalic - failed  . Arteriovenous graft placement  07/14/12    Created at [facility]    MEDICATIONS AT HOME:(please note medication list not reconciled by pharmacy yet) Prior to Admission medications   Medication Sig Start Date End Date Taking?  Authorizing Provider  albuterol (PROVENTIL) (2.5 MG/3ML) 0.083% nebulizer solution Take 2.5 mg by nebulization every 6 (six) hours as needed.    Historical Provider, MD  allopurinol (ZYLOPRIM) 100 MG tablet  09/05/11   Historical Provider, MD  arformoterol (BROVANA) 15 MCG/2ML NEBU Take 15 mcg by nebulization 2 (two) times daily.    Historical Provider, MD  aspirin 81 MG tablet Take 160 mg by mouth daily.    Historical Provider, MD  atorvastatin (LIPITOR) 20 MG tablet Take 20 mg by mouth daily.    Historical Provider, MD  budesonide (PULMICORT) 0.5 MG/2ML nebulizer solution Take 0.5 mg by nebulization 2 (two) times daily.    Historical Provider, MD  BYSTOLIC 5 MG tablet Take 5 mg by mouth daily.  09/10/11   Historical Provider, MD  clopidogrel (PLAVIX) 75 MG tablet Take 75 mg by mouth daily.    Historical Provider, MD  colchicine 0.6 MG tablet Take 0.6 mg by mouth daily.    Historical Provider, MD  DALIRESP 500 MCG TABS tablet Take 500 mcg by mouth daily.  08/30/11   Historical Provider, MD  DETROL LA 4 MG 24 hr capsule Take 4 mg by mouth daily.  09/10/11   Historical Provider, MD  furosemide (LASIX) 20 MG tablet Take 20 mg by mouth 2 (two) times daily.    Historical Provider, MD  multivitamin (RENA-VIT) TABS tablet Take 1 tablet by mouth daily.    Historical Provider, MD  pantoprazole (PROTONIX) 40 MG tablet Take 40 mg by mouth daily.    Historical Provider, MD  potassium chloride SA (K-DUR,KLOR-CON) 20 MEQ tablet Take 20  mEq by mouth daily.  07/29/11   Historical Provider, MD  quinapril-hydrochlorothiazide (ACCURETIC) 20-12.5 MG per tablet Take 1 tablet by mouth daily.    Historical Provider, MD  senna (SENOKOT) 8.6 MG TABS Take 1 tablet by mouth daily.    Historical Provider, MD  theophylline (THEODUR) 100 MG 12 hr tablet Take 100 mg by mouth 2 (two) times daily.    Historical Provider, MD  tiotropium (SPIRIVA) 18 MCG inhalation capsule Place 18 mcg into inhaler and inhale daily.    Historical  Provider, MD  verapamil (CALAN) 40 MG tablet Take 40 mg by mouth 3 (three) times daily - between meals and at bedtime.    Historical Provider, MD  VITAMINS A D C PO Take by mouth.    Historical Provider, MD    FAMILY HISTORY: No family history on file.  SOCIAL HISTORY:  reports that she quit smoking about 7 years ago. Her smoking use included Cigarettes. She smoked 0.00 packs per day. She does not have any smokeless tobacco history on file. She reports that she does not drink alcohol or use illicit drugs.  REVIEW OF SYSTEMS: unable to obtain given confusion.  PHYSICAL EXAM: There were no vitals taken for this visit.  General appearance :Awake,confused, follows some commands, speech appears dysarthric-not sure this is baseline.( Patient's daughter on the way to the hospital)  HEENT: Atraumatic and Normocephalic, pupils equally reactive to light and accomodation Neck: supple, no JVD. No cervical lymphadenopathy.  Chest:Good air entry bilaterally, bibasilar rales.  CVS: S1 S2 regular, no murmurs.  Abdomen: Bowel sounds present, Non tender and not distended with no gaurding, rigidity or rebound. Extremities: B/L Lower Ext shows trace edema, both legs are warm to touch Neurology: moves all 4 extremities, follows most of my commands-unable to evaluate further. Skin:No Rash Wounds:N/A  LABS ON ADMISSION:  No results for input(s): NA, K, CL, CO2, GLUCOSE, BUN, CREATININE, CALCIUM, MG, PHOS in the last 72 hours. No results for input(s): AST, ALT, ALKPHOS, BILITOT, PROT, ALBUMIN in the last 72 hours. No results for input(s): LIPASE, AMYLASE in the last 72 hours. No results for input(s): WBC, NEUTROABS, HGB, HCT, MCV, PLT in the last 72 hours. No results for input(s): CKTOTAL, CKMB, CKMBINDEX, TROPONINI in the last 72 hours. No results for input(s): DDIMER in the last 72 hours. Invalid input(s): POCBNP   RADIOLOGIC STUDIES ON ADMISSION: No results found.   EKG: Independently reviewed.  pending  ASSESSMENT AND PLAN: Present on Admission:  . Encephalopathy acute:reviewed labs/radiological studies available in paper chart from [facility]. Likely encephalopathy is from UTI. CT head negative for acute abnormalities per official report. We will obtain both urine and blood cultures, and then start on empiric vancomycin and cefepime. Not sure what her usual baseline is in terms of mental status, apparently she has had a history of anoxic encephalopathy from a prior cardiac arrest. Patient's daughter on the way to the hospital, I briefly talked to her over the phone. Will obtain more collaborative data when she gets here.  Addendum 8:20 JX:BJYNW with daughter at bedside, reports weakness,lethargy ever starting Remeron. Hold remeron.  Marland KitchenUTI: likely causing above, start cefepime, follow cultures.  Marland KitchenESRD: due for hemodialysis today,renal consulted. Mildly short of breath, but not significantly volume overloaded.  . Essential hypertension:continue with Metoprolol.   .A-fib:On Amiodarone per ED records, will continue, await Pharmacy med reconciliation  .COPD with chronic resp failure:lungs mostly clear-few bibasilar rales-c/w nebs. O2 as needed.  Further plan will depend as patient's clinical  course evolves and further radiologic and laboratory data become available. Patient will be monitored closely.   DVT Prophylaxis: Prophylactic  Heparin  Code Status: Full Code  Total time spent for admission equals 45 minutes.  [facility],Daviel Allegretto [facility] Pager [phone]  If 7PM-7AM, please contact night-coverage [email or web address] Password [email or web address] 07/27/2014, 5:26 PM

## 2014-07-27 NOTE — Progress Notes (Signed)
Pt report called to 6E RN and pt returned safely. Responds to voice and vss.

## 2014-07-27 NOTE — Progress Notes (Signed)
ANTIBIOTIC CONSULT NOTE - INITIAL  Pharmacy Consult for Cefepime, Vancomycin Indication: rule out sepsis  No Known Allergies  Patient Measurements: Height: 5\' 5"  (165.1 cm) (Simultaneous filing. User may not have seen previous data.) Weight: 152 lb 12.5 oz (69.3 kg) (Simultaneous filing. User may not have seen previous data.) IBW/kg (Calculated) : 57    Microbiology: No results found for this or any previous visit (from the past 720 hour(s)).  Medical History: Past Medical History  Diagnosis Date  . Hypertension   . Hyperlipidemia   . COPD (chronic obstructive pulmonary disease)   . Anemia   . ESRD (end stage renal disease) on dialysis   . CHF (congestive heart failure)   . GERD (gastroesophageal reflux disease)   . Constipation   . PEA (Pulseless electrical activity) 04/19/2012    PEA arrest complicated by hyperkalemia, PNA, anocic encephalopathy, VDRF    Assessment: 80 year old female with a past medical history significant for afib, COPD on home oyxgen and ESRD (MWF HD) to start empiric antibiotics for possible sepsis.  She received 1 gram of ce[facility]ime [facility] this morning. Plan is for HD tonight.  Goal of Therapy:  Appropriate antibiotic dosing  Plan:  Cefepime 2 grams iv Q HD  (1 gram with tonight's session) Vancomycin 1 gram iv x 1 dose tonight after HD, then vancomycin 750 mg IV Q HD Follow up progress, fever trend, cultures  Thank you. Okey RegalLisa Candiace West, PharmD [phone]  Elwin SleightPowell, Zeddie Njie Kay 07/27/2014,6:01 PM

## 2014-07-27 NOTE — Consult Note (Signed)
Reason for Consult: Continuity of ESRD care/mild volume overload in patient with ESRD Referring Physician: Caryl ComesShankar Ghimire MD [facility])  HPI:  80 year old African-American woman with past medical history significant for atrial fibrillation, COPD on home oxygen and end-stage renal disease on hemodialysis-Monday/Wednesday/Friday schedule in [place]. Was seen by her primary care provider about 3 weeks ago with concerns of declining appetite for the past 2 months. She was started on Remeron. Over the past 2-3 days, she has been acting confused and more somnolent/lethargic prompting presentation to the emergency room earlier today. She was found to have possible evidence of urinary tract infection/sepsis contributing to her encephalopathy but is also on a potent medication that can cause encephalopathy.  The patient is unable to add further to her history and most of the information is obtained from her daughter who is at the bedside. There is no reported fever, chills, nausea, vomiting, diarrhea, abdominal pain or chest pain recalled in her history.  Past Medical History  Diagnosis Date  . Hypertension   . Hyperlipidemia   . COPD (chronic obstructive pulmonary disease)   . Anemia   . ESRD (end stage renal disease) on dialysis   . CHF (congestive heart failure)   . GERD (gastroesophageal reflux disease)   . Constipation   . PEA (Pulseless electrical activity) 04/19/2012    PEA arrest complicated by hyperkalemia, PNA, anocic encephalopathy, VDRF    Past Surgical History  Procedure Laterality Date  . Abdominal hysterectomy    . Arteriovenous fistula  06/02/12    left radiocephalic - failed  . Arteriovenous graft placement  07/14/12    Created at [facility]    No family history on file.  Social History:  reports that she quit smoking about 7 years ago. Her smoking use included Cigarettes. She smoked 0.00 packs per day. She does not have any smokeless tobacco history on file. She reports that she  does not drink alcohol or use illicit drugs.  Allergies: No Known Allergies  Medications:  Scheduled: . [START ON 07/28/2014] amiodarone  200 mg Oral Daily  . arformoterol  15 mcg Nebulization BID  . [START ON 07/28/2014] aspirin EC  81 mg Oral Daily  . [START ON 07/28/2014] atorvastatin  20 mg Oral Daily  . budesonide  0.5 mg Nebulization BID  . colchicine  0.6 mg Oral Daily  . heparin  5,000 Units Subcutaneous 3 times per day  . ipratropium-albuterol  3 mL Nebulization TID  . [START ON 07/28/2014] multivitamin  1 tablet Oral Daily  . [START ON 07/28/2014] pantoprazole  40 mg Oral Daily  . sodium chloride  3 mL Intravenous Q12H  . [START ON 07/28/2014] tiotropium  18 mcg Inhalation Daily    No results found for this or any previous visit (from the past 48 hour(s)).  No results found.  Review of Systems  Unable to perform ROS: mental acuity   There were no vitals taken for this visit. Physical Exam  Nursing note and vitals reviewed. Constitutional: She appears well-developed and well-nourished. No distress.  HENT:  Head: Normocephalic and atraumatic.  Nose: Nose normal.  Mouth/Throat: Oropharynx is clear and moist.  Eyes: EOM are normal. Pupils are equal, round, and reactive to light. No scleral icterus.  Neck: Normal range of motion. Neck supple. No JVD present. No thyromegaly present.  Cardiovascular: Normal rate.  Exam reveals no friction rub.   No murmur heard. Respiratory: Effort normal. She has wheezes. She has no rales. She exhibits no tenderness.  Right IJ tunneled  dialysis catheter  GI: Soft. Bowel sounds are normal. She exhibits no distension and no mass. There is no tenderness. There is no rebound.  Musculoskeletal: Normal range of motion. She exhibits no edema.  Neurological:  Somnolent/lethargic but arouses to engage in conversation. Not oriented to place/time appropriately.  Skin: Skin is warm and dry. No rash noted. No erythema.    Assessment/Plan: 1.  Encephalopathy: Appears to be bifactorial from possible urinary tract infection with sepsis and possibly drug-induced from recently started mirtazapine for poor appetite. Started on empiric antibiotic therapy by the hospitalist service as cultures are pending. 2. ESRD: With mild volume excess noted on her chest x-ray and with a prior history of CHF/oxygen dependent COPD, will undertake hemodialysis this evening to avoid any acute needs. 3. Anemia of chronic kidney disease: Resume ESA to keep hemoglobin between 10-11 g/dL. No history of overt blood loss 4. Metabolic bone disease: Reconcile binders/vitamin D receptor analog as data becomes available. 5. Malnutrition:  Need to look into her declining appetite with alternative therapies/nutritional supplements.  [name] K. 07/27/2014, 5:43 PM

## 2014-07-28 ENCOUNTER — Inpatient Hospital Stay (HOSPITAL_COMMUNITY): Payer: Medicare Other

## 2014-07-28 DIAGNOSIS — N186 End stage renal disease: Secondary | ICD-10-CM

## 2014-07-28 DIAGNOSIS — J9611 Chronic respiratory failure with hypoxia: Secondary | ICD-10-CM

## 2014-07-28 DIAGNOSIS — G9340 Encephalopathy, unspecified: Secondary | ICD-10-CM

## 2014-07-28 DIAGNOSIS — Z992 Dependence on renal dialysis: Secondary | ICD-10-CM

## 2014-07-28 DIAGNOSIS — J42 Unspecified chronic bronchitis: Secondary | ICD-10-CM

## 2014-07-28 DIAGNOSIS — J9602 Acute respiratory failure with hypercapnia: Secondary | ICD-10-CM | POA: Diagnosis present

## 2014-07-28 DIAGNOSIS — J9600 Acute respiratory failure, unspecified whether with hypoxia or hypercapnia: Secondary | ICD-10-CM | POA: Diagnosis present

## 2014-07-28 DIAGNOSIS — J9601 Acute respiratory failure with hypoxia: Secondary | ICD-10-CM

## 2014-07-28 LAB — GLUCOSE, CAPILLARY
Glucose-Capillary: 110 mg/dL — ABNORMAL HIGH (ref 70–99)
Glucose-Capillary: 74 mg/dL (ref 70–99)
Glucose-Capillary: 61 mg/dL — ABNORMAL LOW (ref 70–99)
Glucose-Capillary: 140 mg/dL — ABNORMAL HIGH (ref 70–99)

## 2014-07-28 LAB — RENAL FUNCTION PANEL
Albumin: 2.9 g/dL — ABNORMAL LOW (ref 3.5–5.2)
Anion gap: 14 (ref 5–15)
BUN: 20 mg/dL (ref 6–23)
CALCIUM: 7.4 mg/dL — AB (ref 8.4–10.5)
CO2: 27 mEq/L (ref 19–32)
CREATININE: 6.43 mg/dL — AB (ref 0.50–1.10)
Chloride: 99 mEq/L (ref 96–112)
GFR calc Af Amer: 6 mL/min — ABNORMAL LOW (ref >90)
GFR calc non Af Amer: 5 mL/min — ABNORMAL LOW (ref >90)
Glucose, Bld: 112 mg/dL — ABNORMAL HIGH (ref 70–99)
Phosphorus: 6.5 mg/dL — ABNORMAL HIGH (ref 2.3–4.6)
Potassium: 4.6 mEq/L (ref 3.7–5.3)
Sodium: 140 mEq/L (ref 137–147)

## 2014-07-28 LAB — CBC
HEMATOCRIT: 36.5 % (ref 36.0–46.0)
Hemoglobin: 10.2 g/dL — ABNORMAL LOW (ref 12.0–15.0)
MCH: 30.4 pg (ref 26.0–34.0)
MCHC: 27.9 g/dL — ABNORMAL LOW (ref 30.0–36.0)
MCV: 108.6 fL — ABNORMAL HIGH (ref 78.0–100.0)
Platelets: 163 10*3/uL (ref 150–400)
RBC: 3.36 MIL/uL — ABNORMAL LOW (ref 3.87–5.11)
RDW: 14.4 % (ref 11.5–15.5)
WBC: 6.7 10*3/uL (ref 4.0–10.5)

## 2014-07-28 LAB — URINE CULTURE
COLONY COUNT: NO GROWTH
CULTURE: NO GROWTH

## 2014-07-28 LAB — POCT I-STAT 3, ART BLOOD GAS (G3+)
Acid-Base Excess: 1 mmol/L (ref 0.0–2.0)
Bicarbonate: 29.7 mEq/L — ABNORMAL HIGH (ref 20.0–24.0)
O2 Saturation: 100 %
PH ART: 7.248 — AB (ref 7.350–7.450)
PO2 ART: 238 mmHg — AB (ref 80.0–100.0)
TCO2: 32 mmol/L (ref 0–100)
pCO2 arterial: 68.2 mmHg (ref 35.0–45.0)

## 2014-07-28 LAB — BLOOD GAS, ARTERIAL
Acid-base deficit: 0.4 mmol/L (ref 0.0–2.0)
BICARBONATE: 29.5 meq/L — AB (ref 20.0–24.0)
DRAWN BY: 313061
O2 CONTENT: 3 L/min
O2 Saturation: 82.6 %
PCO2 ART: 113 mmHg — AB (ref 35.0–45.0)
PH ART: 7.047 — AB (ref 7.350–7.450)
PO2 ART: 56.1 mmHg — AB (ref 80.0–100.0)
Patient temperature: 98.6
TCO2: 33 mmol/L (ref 0–100)

## 2014-07-28 LAB — HEPATITIS B SURFACE ANTIGEN: Hepatitis B Surface Ag: NEGATIVE

## 2014-07-28 MED ORDER — DEXTROSE 50 % IV SOLN
INTRAVENOUS | Status: AC
  Administered 2014-07-28: 50 mL
  Filled 2014-07-28: qty 50

## 2014-07-28 MED ORDER — LIDOCAINE HCL (PF) 1 % IJ SOLN: 5.0000 mL | INTRAMUSCULAR | Status: DC | PRN

## 2014-07-28 MED ORDER — SODIUM CHLORIDE 0.9 % IV SOLN: 100.0000 mL | INTRAVENOUS | Status: DC | PRN

## 2014-07-28 MED ORDER — NEPRO/CARBSTEADY PO LIQD
237.0000 mL | ORAL | Status: DC | PRN
  Filled 2014-07-28: qty 237

## 2014-07-28 MED ORDER — FENTANYL CITRATE 0.05 MG/ML IJ SOLN
50.0000 ug | INTRAMUSCULAR | Status: DC | PRN
  Administered 2014-07-28 – 2014-07-30 (×6): 50 ug via INTRAVENOUS
  Filled 2014-07-28 (×6): qty 2

## 2014-07-28 MED ORDER — FENTANYL CITRATE 0.05 MG/ML IJ SOLN
50.0000 ug | Freq: Once | INTRAMUSCULAR | Status: AC
  Administered 2014-07-28: 50 ug via INTRAVENOUS

## 2014-07-28 MED ORDER — FENTANYL CITRATE 0.05 MG/ML IJ SOLN
25.0000 ug | INTRAMUSCULAR | Status: DC | PRN
  Administered 2014-07-28 (×2): 50 ug via INTRAVENOUS
  Filled 2014-07-28 (×2): qty 2

## 2014-07-28 MED ORDER — ATORVASTATIN CALCIUM 20 MG PO TABS: 20.0000 mg | ORAL_TABLET | Freq: Every day | ORAL | Status: DC

## 2014-07-28 MED ORDER — ASPIRIN 81 MG PO CHEW
81.0000 mg | CHEWABLE_TABLET | Freq: Every day | ORAL | Status: DC
  Administered 2014-07-28 – 2014-08-01 (×5): 81 mg
  Filled 2014-07-28 (×5): qty 1

## 2014-07-28 MED ORDER — FENTANYL CITRATE 0.05 MG/ML IJ SOLN
INTRAMUSCULAR | Status: AC
  Administered 2014-07-28: 50 ug via INTRAVENOUS
  Filled 2014-07-28: qty 2

## 2014-07-28 MED ORDER — MIDAZOLAM HCL 2 MG/2ML IJ SOLN
1.0000 mg | INTRAMUSCULAR | Status: DC | PRN
Start: 2014-07-28 — End: 2014-07-30
  Administered 2014-07-29 (×3): 1 mg via INTRAVENOUS
  Filled 2014-07-28 (×3): qty 2

## 2014-07-28 MED ORDER — CHLORHEXIDINE GLUCONATE 0.12 % MT SOLN
15.0000 mL | Freq: Two times a day (BID) | OROMUCOSAL | Status: DC
  Administered 2014-07-28 – 2014-07-30 (×5): 15 mL via OROMUCOSAL
  Filled 2014-07-28 (×4): qty 15

## 2014-07-28 MED ORDER — NALOXONE HCL 0.4 MG/ML IJ SOLN
INTRAMUSCULAR | Status: AC
  Administered 2014-07-28: 0.4 mg
  Filled 2014-07-28: qty 1

## 2014-07-28 MED ORDER — SODIUM CHLORIDE 0.9 % IV BOLUS (SEPSIS)
1000.0000 mL | Freq: Once | INTRAVENOUS | Status: AC
  Administered 2014-07-28: 1000 mL via INTRAVENOUS

## 2014-07-28 MED ORDER — FENTANYL CITRATE 0.05 MG/ML IJ SOLN
50.0000 ug | INTRAMUSCULAR | Status: DC | PRN
  Administered 2014-07-28: 100 ug via INTRAVENOUS
  Filled 2014-07-28: qty 2

## 2014-07-28 MED ORDER — HEPARIN SODIUM (PORCINE) 1000 UNIT/ML DIALYSIS
3000.0000 [IU] | Freq: Once | INTRAMUSCULAR | Status: AC
  Administered 2014-07-29: 3000 [IU] via INTRAVENOUS_CENTRAL

## 2014-07-28 MED ORDER — FENTANYL CITRATE 0.05 MG/ML IJ SOLN
50.0000 ug | Freq: Once | INTRAMUSCULAR | Status: AC
  Administered 2014-07-28: 50 ug via INTRAVENOUS
  Filled 2014-07-28: qty 2

## 2014-07-28 MED ORDER — IPRATROPIUM-ALBUTEROL 0.5-2.5 (3) MG/3ML IN SOLN
3.0000 mL | Freq: Four times a day (QID) | RESPIRATORY_TRACT | Status: DC
  Administered 2014-07-28 – 2014-08-03 (×22): 3 mL via RESPIRATORY_TRACT
  Filled 2014-07-28 (×23): qty 3

## 2014-07-28 MED ORDER — ALBUTEROL SULFATE (2.5 MG/3ML) 0.083% IN NEBU: 2.5000 mg | INHALATION_SOLUTION | RESPIRATORY_TRACT | Status: DC | PRN

## 2014-07-28 MED ORDER — PENTAFLUOROPROP-TETRAFLUOROETH EX AERO: 1.0000 "application " | INHALATION_SPRAY | CUTANEOUS | Status: DC | PRN

## 2014-07-28 MED ORDER — NOREPINEPHRINE BITARTRATE 1 MG/ML IV SOLN
2.0000 ug/min | INTRAVENOUS | Status: DC
  Administered 2014-07-28: 5 ug/min via INTRAVENOUS
  Filled 2014-07-28 (×2): qty 4

## 2014-07-28 MED ORDER — LIDOCAINE-PRILOCAINE 2.5-2.5 % EX CREA: 1.0000 "application " | TOPICAL_CREAM | CUTANEOUS | Status: DC | PRN

## 2014-07-28 MED ORDER — HEPARIN SODIUM (PORCINE) 1000 UNIT/ML DIALYSIS: 1000.0000 [IU] | INTRAMUSCULAR | Status: DC | PRN

## 2014-07-28 MED ORDER — MIDAZOLAM HCL 2 MG/2ML IJ SOLN
1.0000 mg | Freq: Once | INTRAMUSCULAR | Status: AC
Start: 2014-07-28 — End: 2014-07-28
  Administered 2014-07-28: 1 mg via INTRAVENOUS

## 2014-07-28 MED ORDER — PANTOPRAZOLE SODIUM 40 MG PO PACK
40.0000 mg | PACK | Freq: Every day | ORAL | Status: DC
  Administered 2014-07-28 – 2014-07-30 (×3): 40 mg
  Filled 2014-07-28 (×4): qty 20

## 2014-07-28 MED ORDER — ETOMIDATE 2 MG/ML IV SOLN
20.0000 mg | Freq: Once | INTRAVENOUS | Status: AC
  Administered 2014-07-28: 20 mg via INTRAVENOUS

## 2014-07-28 MED ORDER — ALTEPLASE 2 MG IJ SOLR: 2.0000 mg | Freq: Once | INTRAMUSCULAR | Status: AC | PRN

## 2014-07-28 MED ORDER — AMIODARONE HCL 200 MG PO TABS
200.0000 mg | ORAL_TABLET | Freq: Every day | ORAL | Status: DC
  Administered 2014-07-28 – 2014-08-01 (×5): 200 mg
  Filled 2014-07-28 (×6): qty 1

## 2014-07-28 MED ORDER — MIDAZOLAM HCL 2 MG/2ML IJ SOLN
INTRAMUSCULAR | Status: AC
  Administered 2014-07-28: 1 mg via INTRAVENOUS
  Filled 2014-07-28: qty 2

## 2014-07-28 MED ORDER — CETYLPYRIDINIUM CHLORIDE 0.05 % MT LIQD
7.0000 mL | Freq: Four times a day (QID) | OROMUCOSAL | Status: DC
  Administered 2014-07-28 – 2014-07-30 (×9): 7 mL via OROMUCOSAL

## 2014-07-28 MED ORDER — DEXMEDETOMIDINE HCL IN NACL 200 MCG/50ML IV SOLN
0.0000 ug/kg/h | INTRAVENOUS | Status: DC
  Administered 2014-07-28: 0.5 ug/kg/h via INTRAVENOUS
  Administered 2014-07-29 (×4): 1 ug/kg/h via INTRAVENOUS
  Filled 2014-07-28 (×6): qty 50

## 2014-07-28 MED ORDER — ACETAMINOPHEN 325 MG PO TABS: 650.0000 mg | ORAL_TABLET | Freq: Four times a day (QID) | ORAL | Status: DC | PRN

## 2014-07-28 MED ORDER — ROCURONIUM BROMIDE 50 MG/5ML IV SOLN
50.0000 mg | Freq: Once | INTRAVENOUS | Status: AC
  Administered 2014-07-28: 50 mg via INTRAVENOUS

## 2014-07-28 MED ORDER — LORAZEPAM 2 MG/ML IJ SOLN
0.5000 mg | INTRAMUSCULAR | Status: DC | PRN
  Administered 2014-07-28 (×2): 1 mg via INTRAVENOUS
  Filled 2014-07-28 (×2): qty 1

## 2014-07-28 MED ORDER — MIDAZOLAM HCL 2 MG/2ML IJ SOLN
INTRAMUSCULAR | Status: AC
  Filled 2014-07-28: qty 2

## 2014-07-28 MED ORDER — FLUMAZENIL 0.5 MG/5ML IV SOLN
0.2000 mg | Freq: Once | INTRAVENOUS | Status: AC
  Administered 2014-07-28: 0.2 mg via INTRAVENOUS
  Filled 2014-07-28: qty 5

## 2014-07-28 NOTE — Significant Event (Addendum)
Accepted patient from [facility] at approximately 0840am. Patient arrived with primary RN and Rapid Response RN, arrived on 3L [place], NS @ 10cc via HD catheter. No family at bedside. Patient has an upper denture that was removed prior to intubation and placed in denture cup. CCM at bedside for intubation and line placements.

## 2014-07-28 NOTE — Significant Event (Signed)
Rapid Response Event Note  Overview: Time Called: 0741 Arrival Time: 0750 Event Type: Respiratory  Initial Focused Assessment:  Called by primary Rn for patient lethargic.  Upon my arrival to patients room, Rn at bedside.  Rn states she came into patients room due to her bed alarm going off and patient trying to get out of bed.  Patient was noted to be very lethargic and slightly arousable but drifts off to sleep,  VS OK, SPO2 was 87% on room air and nasal cannula was applied.     Interventions:  Breathing treatment being given on my arrival.  MDs updated, ABG ordered per protocol, Dr Arlean HoppingSchertz at bedside now, narcan 0.4mg   given and romazicon 0.2 mg given.  PCXR done.     Event Summary:  Patient transported to 2 M 14 via bed with monitor, nasal cannula at 3lpm.     at      at          [facility], [name]

## 2014-07-28 NOTE — Consult Note (Signed)
PULMONARY / CRITICAL CARE MEDICINE   Name: [name] MRN: [id number] DOB: 1929-06-29    ADMISSION DATE:  07/27/2014   CONSULTATION DATE:  07/28/2014  REFERRING MD :  [facility]  CHIEF COMPLAINT:  Acute encephalopathy, acute respiratory failure  INITIAL PRESENTATION: 10F with history atrial fibrillation, CHF, COPD on home O2, ESRD on HD MWF, HTN transferred from [facility] 07/27/2014 with acute encephalopathy now with acute on chronic respiratory failure.  STUDIES:  None  SIGNIFICANT EVENTS: 11/05  Acute respiratory failure requiring intubation, LIJ CVC placed  HISTORY OF PRESENT ILLNESS:  10F with history atrial fibrillation, COPD on home O2, ESRD on HD MWF, HTN transferred from [facility] 07/27/2014 with acute encephalopathy. She has had worsening weakness and decreased appetite over the past few weeks. She has increasing confusion over the past few days. She was recently started on Remeron by PCP about 3 weeks ago. At [facility] she was found to have a UTI. She was transferred to [facility]. Admitted by [facility] and started on empiric antibiotics for UTI. She had acute on chronic respiratory failure and PCCM consulted.  PAST MEDICAL HISTORY :  She has a past medical history of Hypertension; Hyperlipidemia; COPD (chronic obstructive pulmonary disease); Anemia; ESRD (end stage renal disease) on dialysis; CHF (congestive heart failure); GERD (gastroesophageal reflux disease); Constipation; and PEA (Pulseless electrical activity) (04/19/2012).   She has past surgical history that includes Abdominal hysterectomy; arteriovenous fistula (06/02/12); and Arteriovenous graft placement (07/14/12).   Prior to Admission medications   Medication Sig Start Date End Date Taking? Authorizing Provider  albuterol (PROVENTIL) (2.5 MG/3ML) 0.083% nebulizer solution Take 2.5 mg by nebulization every 6 (six) hours as needed.    Historical Provider, MD  allopurinol  (ZYLOPRIM) 100 MG tablet  09/05/11   Historical Provider, MD  arformoterol (BROVANA) 15 MCG/2ML NEBU Take 15 mcg by nebulization 2 (two) times daily.    Historical Provider, MD  aspirin 81 MG tablet Take 160 mg by mouth daily.    Historical Provider, MD  atorvastatin (LIPITOR) 20 MG tablet Take 20 mg by mouth daily.    Historical Provider, MD  budesonide (PULMICORT) 0.5 MG/2ML nebulizer solution Take 0.5 mg by nebulization 2 (two) times daily.    Historical Provider, MD  BYSTOLIC 5 MG tablet Take 5 mg by mouth daily.  09/10/11   Historical Provider, MD  clopidogrel (PLAVIX) 75 MG tablet Take 75 mg by mouth daily.    Historical Provider, MD  colchicine 0.6 MG tablet Take 0.6 mg by mouth daily.    Historical Provider, MD  DALIRESP 500 MCG TABS tablet Take 500 mcg by mouth daily.  08/30/11   Historical Provider, MD  DETROL LA 4 MG 24 hr capsule Take 4 mg by mouth daily.  09/10/11   Historical Provider, MD  furosemide (LASIX) 20 MG tablet Take 20 mg by mouth 2 (two) times daily.    Historical Provider, MD  multivitamin (RENA-VIT) TABS tablet Take 1 tablet by mouth daily.    Historical Provider, MD  pantoprazole (PROTONIX) 40 MG tablet Take 40 mg by mouth daily.    Historical Provider, MD  potassium chloride SA (K-DUR,KLOR-CON) 20 MEQ tablet Take 20 mEq by mouth daily.  07/29/11   Historical Provider, MD  quinapril-hydrochlorothiazide (ACCURETIC) 20-12.5 MG per tablet Take 1 tablet by mouth daily.    Historical Provider, MD  senna (SENOKOT) 8.6 MG TABS Take 1 tablet by mouth daily.    Historical Provider, MD  theophylline (THEODUR) 100  MG 12 hr tablet Take 100 mg by mouth 2 (two) times daily.    Historical Provider, MD  tiotropium (SPIRIVA) 18 MCG inhalation capsule Place 18 mcg into inhaler and inhale daily.    Historical Provider, MD  verapamil (CALAN) 40 MG tablet Take 40 mg by mouth 3 (three) times daily - between meals and at bedtime.    Historical Provider, MD  VITAMINS A D C PO Take by mouth.     Historical Provider, MD   No Known Allergies  FAMILY HISTORY:  She has no family status information on file.    SOCIAL HISTORY: She  reports that she quit smoking about 7 years ago. Her smoking use included Cigarettes. She smoked 0.00 packs per day. She does not have any smokeless tobacco history on file. She reports that she does not drink alcohol or use illicit drugs.  REVIEW OF SYSTEMS:  Unable to obtain due to encephalopathy  SUBJECTIVE: Intubated and sedated.  VITAL SIGNS: Temp:  [97.6 F (36.4 C)-98 F (36.7 C)] 97.8 F (36.6 C) (11/05 0500) Pulse Rate:  [68-85] 73 (11/05 0900) Resp:  [14-28] 14 (11/05 0900) BP: (85-134)/(40-63) 85/40 mmHg (11/05 0900) SpO2:  [92 %-100 %] 100 % (11/05 0900) FiO2 (%):  [100 %] 100 % (11/05 0900) Weight:  [151 lb 14.4 oz (68.9 kg)-154 lb 1.6 oz (69.9 kg)] 151 lb 14.4 oz (68.9 kg) (11/04 2307) HEMODYNAMICS:   VENTILATOR SETTINGS: Vent Mode:  [-] PRVC FiO2 (%):  [100 %] 100 % Set Rate:  [14 bmp] 14 bmp Vt Set:  [500 mL] 500 mL PEEP:  [5 cmH20] 5 cmH20 Plateau Pressure:  [18 cmH20] 18 cmH20 INTAKE / OUTPUT:  Intake/Output Summary (Last 24 hours) at 07/28/14 1030 Last data filed at 07/27/14 2230  Gross per 24 hour  Intake      0 ml  Output    810 ml  Net   -810 ml    PHYSICAL EXAMINATION: General:  NAD Neuro:  Sedated HEENT:  ETT in place Cardiovascular:  RRR, no m/r/g Lungs:  Very distant BS, prolonged exp, no wheezes noted Abdomen:  Soft, NT, ND Ext:  Warm and dry, no edema  LABS:  CBC  Recent Labs Lab 07/27/14 1845 07/27/14 1947 07/28/14 0535  WBC 6.3 5.9 6.7  HGB 10.6* 9.7* 10.2*  HCT 36.2 33.4* 36.5  PLT 194 178 163   Coag's No results for input(s): APTT, INR in the last 168 hours. BMET  Recent Labs Lab 07/27/14 1845 07/27/14 1948 07/28/14 0555  NA  --  138 140  K  --  4.1 4.6  CL  --  95* 99  CO2  --  30 27  BUN  --  32* 20  CREATININE 9.10* 7.96* 6.43*  GLUCOSE  --  127* 112*    Electrolytes  Recent Labs Lab 07/27/14 1948 07/28/14 0555  CALCIUM 7.5* 7.4*  PHOS 6.9* 6.5*   Sepsis Markers No results for input(s): LATICACIDVEN, PROCALCITON, O2SATVEN in the last 168 hours. ABG  Recent Labs Lab 07/28/14 0807  PHART 7.047*  PCO2ART 113.0*  PO2ART 56.1*   Liver Enzymes  Recent Labs Lab 07/27/14 1948 07/28/14 0555  ALBUMIN 2.8* 2.9*   Cardiac Enzymes No results for input(s): TROPONINI, PROBNP in the last 168 hours. Glucose  Recent Labs Lab 07/28/14 0742  GLUCAP 110*    Imaging CXR 11/05: bilateral pleural effusions, L greater than right.   ASSESSMENT / PLAN:  PULMONARY OETT 11/05>>> A: Acute on chronic hypercarbic resp  failure - r/t COPD Bilateral pleural effusions P:   Cont full vent support - settings reviewed and/or adjusted Cont vent bundle Daily SBT if/when meets criteria  CARDIOVASCULAR CVL L IJ 11/05>>> A:  No acute issues Hx CHF Hx A fib P:  F/u EKG Troponin in AM Cont amiodarone 200mg  daily ASA 81mg  daily Home Plavix held  RENAL A:   ESRD via R tunneled HD cath P:   Dialysis per nephrology  GASTROINTESTINAL A:   No acute issues P:   SUP: enteral PPI Consider TFs 11/06  HEMATOLOGIC A:   Mild Anemia without overt blood loss P:  DVT px: SQ heparin Monitor CBC intermittently Transfuse per usual ICU guidelines  INFECTIOUS A:   No acute issues P:   BCx2 11/04 >> UC 11/04 >> Sputum 11/05 >>  ENDOCRINE A:   No acute issues P:   Monitor glucose  NEUROLOGIC A:   Sedated P:   RASS goal: 0 Daily WUA Tylenol prn, fent prn Lorazepam prn   FAMILY  - Updates: None present at bedside  - Inter-disciplinary family meet or Palliative Care meeting due by:  11/12    TODAY'S SUMMARY: 43F with history atrial fibrillation, CHF, COPD on home O2, ESRD on HD MWF, [facility] with acute encephalopathy now with acute on chronic respiratory failure. Intubated.  Scheduled Budesonide/Brovana neb and Duoneb.   30 mins ccm time  Billy Fischeravid Simonds, MD ; [facility] service Mobile [phone].  After 5:30 PM or weekends, call [phone] Pulmonary and Critical Care Medicine [facility] Pager: [phone]  07/28/2014, 10:30 AM

## 2014-07-28 NOTE — Procedures (Signed)
Arterial Catheter Insertion Procedure Note [name] PieriniRuth [name] [id number] [birth year]  Procedure: Insertion of Arterial Catheter  Indications: Blood pressure monitoring  Procedure Details Consent: Unable to obtain consent because of altered level of consciousness. Time Out: Verified patient identification, verified procedure, site/side was marked, verified correct patient position, special equipment/implants available, medications/allergies/relevent history reviewed, required imaging and test results available.  Performed  Maximum sterile technique was used including antiseptics, cap, gloves, gown, hand hygiene, mask and sheet. Skin prep: Chlorhexidine; local anesthetic administered 20 gauge catheter was inserted into right femoral artery using the Seldinger technique.  Evaluation Blood flow good; BP tracing good. Complications: No apparent complications.   BABCOCK,PETE 07/28/2014   I was present for and supervised the entire procedure  Billy Fischeravid Jacquees Gongora, MD ; [facility] Mobile [phone].  After 5:30 PM or weekends, call [phone]

## 2014-07-28 NOTE — Significant Event (Signed)
Spoke with Dr. Sung AmabileSimonds in person regarding ABG results below. This ABG was from several hours after patient was intubated. Per MD, to decrease FIO2 down to 40% (from 100%). RRT made aware and has made the changes.   Results for [name], [name] (MRN [id number]) as of 07/28/2014  Ref. Range 07/28/2014 16:12  Sample type No range found ARTERIAL  pH, Arterial Latest Range: 7.350-7.450  7.248 (L)  pCO2 arterial Latest Range: 35.0-45.0 mmHg 68.2 (HH)  pO2, Arterial Latest Range: 80.0-100.0 mmHg 238.0 (H)  Bicarbonate Latest Range: 20.0-24.0 mEq/L 29.7 (H)  TCO2 Latest Range: 0-100 mmol/L 32  Acid-Base Excess Latest Range: 0.0-2.0 mmol/L 1.0  O2 Saturation No range found 100.0  Collection site No range found ARTERIAL LINE

## 2014-07-28 NOTE — Procedures (Signed)
Intubation Procedure Note [name] [id number] April 17, 1929  Procedure: Intubation Indications: Respiratory insufficiency  Procedure Details Consent: Unable to obtain consent because of emergent medical necessity. Time Out: Verified patient identification, verified procedure, site/side was marked, verified correct patient position, special equipment/implants available, medications/allergies/relevent history reviewed, required imaging and test results available.  Performed  MAC and 3 Medications:  Fentanyl 50 mcg Etomidate 20 mg Versed NMB 50 mg zemuron   Evaluation Hemodynamic Status: Transient hypotension treated with fluid; O2 sats: stable throughout Patient's Current Condition: stable Complications: No apparent complications Patient did tolerate procedure well. Chest X-ray ordered to verify placement.  CXR: pending.   Brett CanalesSteve Minor ACNP [facility] Pager [phone] till 3 pm If no answer page [phone] 07/28/2014, 8:59 AM  I was present for and supervised the entire procedure  Billy Fischeravid Simonds, MD ; [facility] Mobile [phone].  After 5:30 PM or weekends, call [phone]

## 2014-07-28 NOTE — H&P (Signed)
Patient BP dropped to 60s, after initiation of precedex drip. Precedex drip has been off since this episode occurred. Spoke with Dr. Craige Cot[place]Sood [place]. MD stated will put in new orders.

## 2014-07-28 NOTE — Procedures (Signed)
Central Venous Catheter Insertion Procedure Note [name] [id number] Feb 19, 1929  Procedure: Insertion of Central Venous Catheter Indications: Assessment of intravascular volume, Drug and/or fluid administration and Frequent blood sampling  Procedure Details Consent: Unable to obtain consent because of emergent medical necessity. Time Out: Verified patient identification, verified procedure, site/side was marked, verified correct patient position, special equipment/implants available, medications/allergies/relevent history reviewed, required imaging and test results available.  Performed  Maximum sterile technique was used including antiseptics, cap, gloves, gown, hand hygiene, mask and sheet. Skin prep: Chlorhexidine; local anesthetic administered A antimicrobial bonded/coated triple lumen catheter was placed in the left internal jugular vein using the Seldinger technique. Ultrasound guidance used.Yes.   Catheter placed to 20 cm. Blood aspirated via all 3 ports and then flushed x 3. Line sutured x 2 and dressing applied.  Evaluation Blood flow good Complications: No apparent complications Patient did tolerate procedure well. Chest X-ray ordered to verify placement.  CXR: pending.  Brett CanalesSteve Minor ACNP [facility] Pager [phone] till 3 pm If no answer page [phone] 07/28/2014, 9:24 AM   I was present for and supervised the entire procedure  Billy Fischeravid Cloys Vera, MD ; [facility] service Mobile [phone].  After 5:30 PM or weekends, call [phone]

## 2014-07-28 NOTE — Progress Notes (Signed)
eLink Physician-Brief Progress Note Patient Name: [name] PieriniRuth Bahr DOB: [birth year] MRN: [id number]   Date of Service  07/28/2014  HPI/Events of Note  Hypotensive.  eICU Interventions  Will give fluid bolus.     Intervention Category Major Interventions: Other:  Izzah Pasqua 07/28/2014, 6:59 PM

## 2014-07-28 NOTE — Significant Event (Signed)
VO from CCM for placement of OG. RN unable to place OG. Patient had copious amount of oral secretions despite frequent suctioning. RN placed NG instead without any difficulty or complications-NP Brett CanalesSteve Minor aware that NG was placed instead of OG. Will continue to monitor. [name], Charity fundraiserN.

## 2014-07-28 NOTE — Progress Notes (Signed)
Attempted A-line x 2 with sterile technique- and per protocol.  Noted blood flash x 2 but I was unable to thread catheter.  MD and NP aware.    Also,  RN collected sputum culture.  Culture was labeled.  Sent to lab with requisition by unit RT.

## 2014-07-28 NOTE — Significant Event (Signed)
Found patient side way in the bed; almost fallen off the bed. Patient was very agitated. Patient does follow commands when calm. RN gave fentanyl and ativan. MD Sood made aware of this-stated will look at her information and put in orders. Bed alarm on; family at bedside. Will continue to monitor closely. Shivam Mestas, Charity fundraiserN.

## 2014-07-28 NOTE — Plan of Care (Signed)
Problem: Phase I Progression Outcomes Goal: Initial discharge plan identified Outcome: Completed/Met Date Met:  07/28/14

## 2014-07-28 NOTE — Significant Event (Signed)
1140am-Femoral A line placed by CCM [employer]. As per verbal order from [facility] during procedure, patient received a total of 62m of versed, 549m fentanyl. RN drew up zemuron 5077mer VO of [name], CCM NP prior to Pet[place]ummerking over procedure; however, zemuron 33m41ms not administered during procedure-wasted in sink and flushed. The rest of medications not used in SSRI kit returned to pharmacy to be credited back.    Patient's daughter arrived to unit. Updated by RN of patient's status. Provider also updated patient's daughter at the bedside.

## 2014-07-28 NOTE — Progress Notes (Signed)
PT Cancellation Note  Patient Details Name: [name] MRN: [id number] DOB: March 24, 1929   Cancelled Treatment:    Reason Eval/Treat Not Completed: Medical issues which prohibited therapy. Pt transferred to ICU this morning; will continue to follow and attempt PT eval when medically ready.    Conni SlipperKirkman, Devanee Pomplun 07/28/2014, 8:43 AM   Conni SlipperLaura Ohanna Gassert, PT, DPT Acute Rehabilitation Services Pager: [phone]

## 2014-07-28 NOTE — Progress Notes (Signed)
Patient ID: [name] PieriniRuth [name]  female  YNW:[id number]    DOB: [birth year]    DOA: 07/27/2014  PCP: [name] RakesHODGES,[name], MD  History of present illness  Patient is a 80 year old female with past medical history of ESRD on HD, COPD on home O2, hypertension, atrial fibrillation who [facility] due to confusion and weakness. Patient was found to have UTI and was started on IV antibiotics. Patient also underwent hemodialysis per her regular days but was not able to complete it.  On 11/5 a.m., patient was found to be very lethargic, had received Ativan last night, responding to painful stimuli. Stat ABG showed pH of 7.0, PCO2 111, PO2 56.1, stat chest x-ray showed bilateral pleural effusions. PCP M was consulted for impending respiratory failure and needing intubation.patient was transferred to ICU.  Assessment/Plan: Principal Problem:   Acute hypercarbiac respiratory failure - PCCM consulted, patient not awake and alert enough to withstand BiPAP, transferred to ICU  Active Problems:   Essential hypertension - Currently stable    Encephalopathy acute: Likely due to UTI, superimposed on dementia, medication effect from Ativan, CO2 narcosis - continue IV antibiotics, transfered to ICU for impending respiratory failure and ventilation    ESRD on dialysis - renal consulted, on hemodialysis MWF, will likely need dialysis again, did not complete last night    A-fib - Rate control    COPD (chronic obstructive pulmonary disease)With chronic respiratory failure, - As #1    UTI (urinary tract infection) - Follow urine culture and sensitivities - patient currently not on any antibiotics, was on vancomycin and cefepime earlier this morning   DVT Prophylaxis:heparin subcutaneous  Code Status: full code  Family Communication:  Disposition:  Transfer to ICU  Consultants:  Renal  CCM  Procedures:  HD  Antibiotics:  Vanc 11/4-?  Cefepime  11/4-?  Subjective: patient very lethargic, not responding verbal commands, rapid response at the bedside  Objective: Weight change:   Intake/Output Summary (Last 24 hours) at 07/28/14 0823 Last data filed at 07/27/14 2230  Gross per 24 hour  Intake      0 ml  Output    810 ml  Net   -810 ml   Blood pressure 94/52, pulse 85, temperature 97.8 F (36.6 C), temperature source Oral, resp. rate 28, height 5\' 5"  (1.651 m), weight 68.9 kg (151 lb 14.4 oz), SpO2 95 %.  Physical Exam: General: somnolent, lethargic, not responding verbal commands CVS: S1-S2 clear, no murmur rubs or gallops Chest: decreased breath sounds throughout Abdomen: soft nontender, nondistended, normal bowel sounds  Extremities: no cyanosis, clubbing or edema noted bilaterally Neuro: not following commands  Lab Results: Basic Metabolic Panel:  Recent Labs Lab 07/27/14 1948 07/28/14 0555  NA 138 140  K 4.1 4.6  CL 95* 99  CO2 30 27  GLUCOSE 127* 112*  BUN 32* 20  CREATININE 7.96* 6.43*  CALCIUM 7.5* 7.4*  PHOS 6.9* 6.5*   Liver Function Tests:  Recent Labs Lab 07/27/14 1948 07/28/14 0555  ALBUMIN 2.8* 2.9*   No results for input(s): LIPASE, AMYLASE in the last 168 hours. No results for input(s): AMMONIA in the last 168 hours. CBC:  Recent Labs Lab 07/27/14 1947 07/28/14 0535  WBC 5.9 6.7  HGB 9.7* 10.2*  HCT 33.4* 36.5  MCV 102.8* 108.6*  PLT 178 163   Cardiac Enzymes: No results for input(s): CKTOTAL, CKMB, CKMBINDEX, TROPONINI in the last 168 hours. BNP: Invalid input(s): POCBNP CBG:  Recent Labs Lab 07/28/14 [phone]  GLUCAP 110*     Micro Results: Recent Results (from the past 240 hour(s))  MRSA PCR Screening     Status: None   Collection Time: 07/27/14  6:51 PM  Result Value Ref Range Status   MRSA by PCR NEGATIVE NEGATIVE Final    Comment:        The GeneXpert MRSA Assay (FDA approved for NASAL specimens only), is one component of a comprehensive MRSA  colonization surveillance program. It is not intended to diagnose MRSA infection nor to guide or monitor treatment for MRSA infections.     Studies/Results: No results found.  Medications: Scheduled Meds: . amiodarone  200 mg Oral Daily  . antiseptic oral rinse  7 mL Mouth Rinse q12n4p  . arformoterol  15 mcg Nebulization BID  . aspirin EC  81 mg Oral Daily  . atorvastatin  20 mg Oral Daily  . budesonide  0.5 mg Nebulization BID  . ceFEPime (MAXIPIME) IV  1 g Intravenous Once  . [START ON 07/29/2014] ceFEPime (MAXIPIME) IV  2 g Intravenous Q M,W,F-HD  . chlorhexidine  15 mL Mouth Rinse BID  . colchicine  0.3 mg Oral Daily  . flumazenil  0.2 mg Intravenous Once  . heparin  5,000 Units Subcutaneous 3 times per day  . ipratropium-albuterol  3 mL Nebulization TID  . LORazepam      . LORazepam  1 mg Intravenous Once  . multivitamin  1 tablet Oral Daily  . naloxone      . pantoprazole  40 mg Oral Daily  . sodium chloride  3 mL Intravenous Q12H  . tiotropium  18 mcg Inhalation Daily  . vancomycin  1,000 mg Intravenous Once  . [START ON 07/29/2014] vancomycin  750 mg Intravenous Q M,W,F-HD      LOS: 1 day   RAI,RIPUDEEP M.D. [facility] 07/28/2014, 8:23 AM Pager: [phone]  If 7PM-7AM, please contact night-coverage [email or web address] Password [email or web address]

## 2014-07-28 NOTE — Progress Notes (Signed)
[facility] Progress Note   Subjective: not responding this am, got some ATivan last night. Had HD last night, not much fluid removed d/t low BP's, finished early after system clotted  Filed Vitals:   07/28/14 0500 07/28/14 0748 07/28/14 0845 07/28/14 0900  BP: 94/52   85/40  Pulse: 85   73  Temp: 97.8 F (36.6 C)     TempSrc: Oral     Resp: 28  20 14   Height:      Weight:      SpO2: 92% 95%  100%   Exam: Obtunded, unable to arouse, poor air movement No jvd Chest clear L, faint rales R base RRR no MRG Abd soft, NTND No LE edema R IJ cath in place Neuro is obtunded, unresponsive  HD: MWF [facility] 3.5h   69kg   2/2.25 BAth  Heparin 3000  R IJ cath Aranesp 25 ug every 2 weeks, Calcitriol 1.0 ug TIW pth 991, Ca 7.4, P 4.5        Assessment: 1 AMS - hypercarbic resp failure, intubated now on vent 2 ESRD on HD 3 HTN/volume - takes bystolic, quinapril, HCTZ, verapamil at home; BP meds on hold d/t low bp. Is right at dry wt, no vol excess on exam or xray 4 Anemia Hb 10.2, get records 5 HPTH cont vit D, no binder on med list 6 COPD on home O2 7 Hx PEA arrest Jul 2013 8 Access - hx failed L RC AVF, hx prior [facility]   Plan- next HD tomorrow in ICU, keep even    Vinson Moselleob Montez Cuda MD  pager [phone]    cell [phone]  07/28/2014, 9:19 AM     Recent Labs Lab 07/27/14 1845 07/27/14 1948 07/28/14 0555  NA  --  138 140  K  --  4.1 4.6  CL  --  95* 99  CO2  --  30 27  GLUCOSE  --  127* 112*  BUN  --  32* 20  CREATININE 9.10* 7.96* 6.43*  CALCIUM  --  7.5* 7.4*  PHOS  --  6.9* 6.5*    Recent Labs Lab 07/27/14 1948 07/28/14 0555  ALBUMIN 2.8* 2.9*    Recent Labs Lab 07/27/14 1845 07/27/14 1947 07/28/14 0535  WBC 6.3 5.9 6.7  HGB 10.6* 9.7* 10.2*  HCT 36.2 33.4* 36.5  MCV 102.5* 102.8* 108.6*  PLT 194 178 163   . amiodarone  200 mg Oral Daily  . antiseptic oral rinse  7 mL Mouth Rinse q12n4p  . antiseptic oral rinse  7 mL  Mouth Rinse QID  . arformoterol  15 mcg Nebulization BID  . aspirin EC  81 mg Oral Daily  . atorvastatin  20 mg Oral Daily  . budesonide  0.5 mg Nebulization BID  . ceFEPime (MAXIPIME) IV  1 g Intravenous Once  . [START ON 07/29/2014] ceFEPime (MAXIPIME) IV  2 g Intravenous Q M,W,F-HD  . chlorhexidine  15 mL Mouth Rinse BID  . chlorhexidine  15 mL Mouth Rinse BID  . colchicine  0.3 mg Oral Daily  . heparin  5,000 Units Subcutaneous 3 times per day  . ipratropium-albuterol  3 mL Nebulization TID  . LORazepam  1 mg Intravenous Once  . midazolam      . multivitamin  1 tablet Oral Daily  . pantoprazole  40 mg Oral Daily  . sodium chloride  3 mL Intravenous Q12H  . tiotropium  18 mcg Inhalation Daily  .  vancomycin  1,000 mg Intravenous Once  . [START ON 07/29/2014] vancomycin  750 mg Intravenous Q M,W,F-HD     sodium chloride, acetaminophen **OR** acetaminophen, albuterol, guaiFENesin-dextromethorphan, ondansetron **OR** ondansetron (ZOFRAN) IV, oxyCODONE, sodium chloride

## 2014-07-28 NOTE — Progress Notes (Signed)
INITIAL NUTRITION ASSESSMENT  DOCUMENTATION CODES Per approved criteria  -Not Applicable   INTERVENTION:  If unable to extubate within the next 24 hours, recommend initiate TF via OGT with Nepro at 20 ml/h and Prostat 30 ml QID on day 1; on day 2, continue goal rate of 20 ml/h (480 ml per day) to provide 1264 kcals, 99 gm protein, 349 ml free water daily.  NUTRITION DIAGNOSIS: Inadequate oral intake related to inability to eat as evidenced by NPO status.   Goal: Intake to meet >90% of estimated nutrition needs.  Monitor:  TF tolerance/adequacy, weight trend, labs, vent status.  Reason for Assessment: VDRF  80 y.o. female  Admitting Dx: Acute respiratory failure  ASSESSMENT: 80 y.o. female with a Past Medical History of End-stage renal disease,COPD on home O2, hypertension, atrial fibrillation, who was [facility] for evaluation of confusion and weakness.  Nutrition focused physical exam completed.  No muscle or subcutaneous fat depletion noticed. Nephrology team following, plans for HD tomorrow.  Patient is currently intubated on ventilator support MV: 7.1 L/min Temp (24hrs), Avg:97.8 F (36.6 C), Min:97.6 F (36.4 C), Max:98 F (36.7 C)  Propofol: none  Height: Ht Readings from Last 1 Encounters:  07/27/14 5\' 5"  (1.651 m)    Weight: Wt Readings from Last 1 Encounters:  07/27/14 151 lb 14.4 oz (68.9 kg)    Ideal Body Weight: 56.8 kg  % Ideal Body Weight: 121%  Wt Readings from Last 10 Encounters:  07/27/14 151 lb 14.4 oz (68.9 kg)  10/08/11 183 lb (83.008 kg)  10/11/08 207 lb (93.895 kg)  08/09/08 194 lb (87.998 kg)  07/12/08 198 lb 8 oz (90.039 kg)    Usual Body Weight: 183 lb (almost 3 years ago)  % Usual Body Weight: 83%  BMI:  Body mass index is 25.28 kg/(m^2).  Estimated Nutritional Needs: Kcal: 1242 Protein: 82-96 gm Fluid: 1.2 L  Skin: WDL  Diet Order:  NPO  EDUCATION NEEDS: -Education not appropriate at this  time   Intake/Output Summary (Last 24 hours) at 07/28/14 1228 Last data filed at 07/28/14 0900  Gross per 24 hour  Intake      0 ml  Output    810 ml  Net   -810 ml    Last BM: PTA   Labs:   Recent Labs Lab 07/27/14 1845 07/27/14 1948 07/28/14 0555  NA  --  138 140  K  --  4.1 4.6  CL  --  95* 99  CO2  --  30 27  BUN  --  32* 20  CREATININE 9.10* 7.96* 6.43*  CALCIUM  --  7.5* 7.4*  PHOS  --  6.9* 6.5*  GLUCOSE  --  127* 112*    CBG (last 3)   Recent Labs  07/28/14 0742  GLUCAP 110*    Scheduled Meds: . amiodarone  200 mg Per Tube Daily  . antiseptic oral rinse  7 mL Mouth Rinse QID  . arformoterol  15 mcg Nebulization BID  . aspirin  81 mg Per Tube Daily  . budesonide  0.5 mg Nebulization BID  . chlorhexidine  15 mL Mouth Rinse BID  . fentaNYL  50 mcg Intravenous Once  . [START ON 07/29/2014] heparin  3,000 Units Dialysis Once in dialysis  . heparin  5,000 Units Subcutaneous 3 times per day  . ipratropium-albuterol  3 mL Nebulization Q6H  . pantoprazole sodium  40 mg Per Tube Q1200  . sodium chloride  3 mL  Intravenous Q12H    Continuous Infusions:   Past Medical History  Diagnosis Date  . Hypertension   . Hyperlipidemia   . COPD (chronic obstructive pulmonary disease)   . Anemia   . ESRD (end stage renal disease) on dialysis   . CHF (congestive heart failure)   . GERD (gastroesophageal reflux disease)   . Constipation   . PEA (Pulseless electrical activity) 04/19/2012    PEA arrest complicated by hyperkalemia, PNA, anocic encephalopathy, VDRF    Past Surgical History  Procedure Laterality Date  . Abdominal hysterectomy    . Arteriovenous fistula  06/02/12    left radiocephalic - failed  . Arteriovenous graft placement  07/14/12    Created at [facility]    Joaquin CourtsKimberly Akeel Reffner, RD, LDN, CNSC Pager [phone] After Hours Pager [phone]

## 2014-07-28 NOTE — Plan of Care (Signed)
Problem: Consults Goal: Ventilated Patients Patient Education See Patient Education Module for education specifics. Outcome: Progressing Goal: Skin Care Protocol Initiated - if Braden Score 18 or less If consults are not indicated, leave blank or document N/A Outcome: Progressing Goal: Nutrition Consult-if indicated Outcome: Progressing Goal: Diabetes Guidelines if Diabetic/Glucose > 140 If diabetic or lab glucose is > 140 mg/dl - Initiate Diabetes/Hyperglycemia Guidelines & Document Interventions  Outcome: Progressing

## 2014-07-29 ENCOUNTER — Inpatient Hospital Stay (HOSPITAL_COMMUNITY): Payer: Medicare Other

## 2014-07-29 DIAGNOSIS — J449 Chronic obstructive pulmonary disease, unspecified: Secondary | ICD-10-CM

## 2014-07-29 LAB — CBC
HCT: 32.3 % — ABNORMAL LOW (ref 36.0–46.0)
Hemoglobin: 9.8 g/dL — ABNORMAL LOW (ref 12.0–15.0)
MCH: 30.1 pg (ref 26.0–34.0)
MCHC: 30.3 g/dL (ref 30.0–36.0)
MCV: 99.1 fL (ref 78.0–100.0)
Platelets: 167 K/uL (ref 150–400)
RBC: 3.26 MIL/uL — ABNORMAL LOW (ref 3.87–5.11)
RDW: 14.3 % (ref 11.5–15.5)
WBC: 7.1 K/uL (ref 4.0–10.5)

## 2014-07-29 LAB — POCT I-STAT 3, ART BLOOD GAS (G3+)
Acid-Base Excess: 2 mmol/L (ref 0.0–2.0)
Bicarbonate: 28 meq/L — ABNORMAL HIGH (ref 20.0–24.0)
O2 Saturation: 97 %
Patient temperature: 98
TCO2: 29 mmol/L (ref 0–100)
pCO2 arterial: 45.8 mmHg — ABNORMAL HIGH (ref 35.0–45.0)
pH, Arterial: 7.393 (ref 7.350–7.450)
pO2, Arterial: 88 mmHg (ref 80.0–100.0)

## 2014-07-29 LAB — GLUCOSE, CAPILLARY
GLUCOSE-CAPILLARY: 90 mg/dL (ref 70–99)
Glucose-Capillary: 106 mg/dL — ABNORMAL HIGH (ref 70–99)
Glucose-Capillary: 103 mg/dL — ABNORMAL HIGH (ref 70–99)
Glucose-Capillary: 106 mg/dL — ABNORMAL HIGH (ref 70–99)
Glucose-Capillary: 98 mg/dL (ref 70–99)

## 2014-07-29 LAB — COMPREHENSIVE METABOLIC PANEL
ALK PHOS: 36 U/L — AB (ref 39–117)
ALT: <5 U/L (ref 0–35)
ANION GAP: 15 (ref 5–15)
AST: 19 U/L (ref 0–37)
Albumin: 2.4 g/dL — ABNORMAL LOW (ref 3.5–5.2)
BILIRUBIN TOTAL: 0.4 mg/dL (ref 0.3–1.2)
BUN: 31 mg/dL — ABNORMAL HIGH (ref 6–23)
CHLORIDE: 100 meq/L (ref 96–112)
CO2: 25 mEq/L (ref 19–32)
Calcium: 6.8 mg/dL — ABNORMAL LOW (ref 8.4–10.5)
Creatinine, Ser: 7.51 mg/dL — ABNORMAL HIGH (ref 0.50–1.10)
GFR calc Af Amer: 5 mL/min — ABNORMAL LOW (ref >90)
GFR calc non Af Amer: 4 mL/min — ABNORMAL LOW (ref >90)
Glucose, Bld: 79 mg/dL (ref 70–99)
POTASSIUM: 3.6 meq/L — AB (ref 3.7–5.3)
Sodium: 140 mEq/L (ref 137–147)
TOTAL PROTEIN: 5.8 g/dL — AB (ref 6.0–8.3)

## 2014-07-29 LAB — PROCALCITONIN: Procalcitonin: 0.63 ng/mL

## 2014-07-29 LAB — TROPONIN I

## 2014-07-29 MED ORDER — DEXTROSE 5 % IV SOLN
2.0000 g | INTRAVENOUS | Status: AC
  Administered 2014-08-01: 2 g via INTRAVENOUS
  Filled 2014-07-29: qty 2

## 2014-07-29 MED ORDER — WHITE PETROLATUM GEL
Status: AC
  Administered 2014-07-29: 0.2
  Filled 2014-07-29: qty 5

## 2014-07-29 MED ORDER — DARBEPOETIN ALFA 25 MCG/0.42ML IJ SOSY: 25.0000 ug | PREFILLED_SYRINGE | INTRAMUSCULAR | Status: DC

## 2014-07-29 MED ORDER — VANCOMYCIN HCL IN DEXTROSE 750-5 MG/150ML-% IV SOLN
750.0000 mg | Freq: Once | INTRAVENOUS | Status: AC
  Administered 2014-07-29: 750 mg via INTRAVENOUS
  Filled 2014-07-29: qty 150

## 2014-07-29 MED ORDER — DEXTROSE 5 % IV SOLN
2.0000 g | Freq: Once | INTRAVENOUS | Status: AC
  Administered 2014-07-29: 2 g via INTRAVENOUS
  Filled 2014-07-29: qty 2

## 2014-07-29 MED ORDER — DARBEPOETIN ALFA 25 MCG/0.42ML IJ SOSY
25.0000 ug | PREFILLED_SYRINGE | Freq: Once | INTRAMUSCULAR | Status: AC
  Administered 2014-07-29: 25 ug via INTRAVENOUS
  Filled 2014-07-29: qty 0.42

## 2014-07-29 MED ORDER — SODIUM CHLORIDE 0.9 % IV SOLN: INTRAVENOUS | Status: DC | PRN

## 2014-07-29 MED ORDER — VANCOMYCIN HCL IN DEXTROSE 750-5 MG/150ML-% IV SOLN: 750.0000 mg | INTRAVENOUS | Status: DC

## 2014-07-29 MED ORDER — DARBEPOETIN ALFA 25 MCG/0.42ML IJ SOSY
25.0000 ug | PREFILLED_SYRINGE | INTRAMUSCULAR | Status: DC
Start: 2014-08-03 — End: 2014-07-29

## 2014-07-29 NOTE — Progress Notes (Signed)
Pt becomes very agitated and at times combative with stimulation such as turns, mouth care, or suctioning; to achieve RASS goal, as well as for apparent pain mgmt and agitation, pt has required titration of Precedex gtt, Fentanyl and Versed prn per MD order; continue to monitor... Marvia PicklesJames, Amanpreet Delmont Sara, RN

## 2014-07-29 NOTE — Progress Notes (Signed)
  [facility] Progress Note   Subjective: intubated yest am, CXR"s have shown mild IS edema and LLL consolidation, marked cardiomegaly  Filed Vitals:   07/29/14 0856 07/29/14 0900 07/29/14 0915 07/29/14 0930  BP:  122/43 101/31 144/46  Pulse:  57 71 62  Temp:      TempSrc:      Resp:  20 21 21   Height:      Weight:      SpO2: 100% 99% 96% 99%   Exam: ON vent, sedated No jvd Chest no ant rales or wheezing RRR no MRG Abd soft, NTND Mild UE edema, no LE edema R IJ cath in place Neuro sedated on vent, opens eyes to voice  HD: MWF [facility] 3.5h   69kg   2/2.25 BAth  Heparin 3000  R IJ cath Aranesp 25 ug every 2 weeks, Calcitriol 1.0 ug TIW pth 991, Ca 7.4, P 4.5        Assessment: 1 Acute on chronic resp failure - combination underlying COPD/ LLL process / prob element of vol overload too 2 ESRD on HD 3 HTN/volume - takes bystolic, quinapril, HCTZ, verapamil at home; BP meds on hold d/t low BP 4 Anemia Hb 10.2, cont darbe q other wk, Hb 9.8 5 HPTH cont vit D, no binder on med list 6 COPD on home O2 7 Hx PEA arrest Jul 2013 8 Access - hx failed L RC AVF, hx prior [facility]; is using catheter   Plan- HD today, UF 3 kg as tolerated    Vinson Moselleob Lahna Nath MD  pager [phone]    cell [phone]  07/29/2014, 9:33 AM     Recent Labs Lab 07/27/14 1948 07/28/14 0555 07/29/14 0455  NA 138 140 140  K 4.1 4.6 3.6*  CL 95* 99 100  CO2 30 27 25   GLUCOSE 127* 112* 79  BUN 32* 20 31*  CREATININE 7.96* 6.43* 7.51*  CALCIUM 7.5* 7.4* 6.8*  PHOS 6.9* 6.5*  --     Recent Labs Lab 07/27/14 1948 07/28/14 0555 07/29/14 0455  AST  --   --  19  ALT  --   --  <5  ALKPHOS  --   --  36*  BILITOT  --   --  0.4  PROT  --   --  5.8*  ALBUMIN 2.8* 2.9* 2.4*    Recent Labs Lab 07/27/14 1947 07/28/14 0535 07/29/14 0630  WBC 5.9 6.7 7.1  HGB 9.7* 10.2* 9.8*  HCT 33.4* 36.5 32.3*  MCV 102.8* 108.6* 99.1  PLT 178 163 167   . amiodarone  200 mg Per  Tube Daily  . antiseptic oral rinse  7 mL Mouth Rinse QID  . arformoterol  15 mcg Nebulization BID  . aspirin  81 mg Per Tube Daily  . budesonide  0.5 mg Nebulization BID  . chlorhexidine  15 mL Mouth Rinse BID  . heparin  5,000 Units Subcutaneous 3 times per day  . ipratropium-albuterol  3 mL Nebulization Q6H  . pantoprazole sodium  40 mg Per Tube Q1200  . sodium chloride  3 mL Intravenous Q12H   . dexmedetomidine 1 mcg/kg/hr (07/29/14 0756)  . norepinephrine (LEVOPHED) Adult infusion Stopped (07/29/14 0315)   sodium chloride, sodium chloride, sodium chloride, acetaminophen **OR** [DISCONTINUED] acetaminophen, albuterol, feeding supplement (NEPRO CARB STEADY), fentaNYL, heparin, lidocaine (PF), lidocaine-prilocaine, midazolam, ondansetron **OR** ondansetron (ZOFRAN) IV, pentafluoroprop-tetrafluoroeth, sodium chloride

## 2014-07-29 NOTE — Progress Notes (Signed)
UR Completed.  Liem Copenhaver [name] [phone] 07/29/2014  

## 2014-07-29 NOTE — Plan of Care (Signed)
Problem: Consults Goal: Ventilated Patients Patient Education See Patient Education Module for education specifics.  Outcome: Progressing

## 2014-07-29 NOTE — Consult Note (Signed)
PULMONARY / CRITICAL CARE MEDICINE   Name: [name] MRN: [id number] DOB: 21-Dec-1928    ADMISSION DATE:  07/27/2014   CONSULTATION DATE:  1Baker Pierini1/01/2014  REFERRING MD :  [facility]  INITIAL PRESENTATION:  Acute encephalopathy, acute respiratory failure   51F with history atrial fibrillation, COPD on home O2, ESRD on HD MWF, HTN transferred [facility] 07/27/2014 with acute encephalopathy. She has had worsening weakness and decreased appetite over the past few weeks. She has increasing confusion over the past few days. She was recently started on Remeron by PCP about 3 weeks ago. At [facility] she was found to have a UTI. [facility]. Admitted by [facility] and started on empiric antibiotics for UTI. She had acute on chronic respiratory failure and PCCM consulted.   SIGNIFICANT EVENTS: 07/27/2014 - admit [facility]/05  Acute respiratory failure requiring intubation, LIJ CVC placed    SUBJECTIVE/OVERNIGHT/INTERVAL HX  07/29/14: Frail. On levophed low dose and precedex   HISTORY OF PRESENT ILLNESS: VITAL SIGNS: Temp:  [97.6 F (36.4 C)-99 F (37.2 C)] 98 F (36.7 C) (11/06 1139) Pulse Rate:  [39-88] 72 (11/06 1109) Resp:  [13-24] 21 (11/06 1109) BP: (55-164)/(31-62) 143/45 mmHg (11/06 1109) SpO2:  [94 %-100 %] 100 % (11/06 1109) Arterial Line BP: (51-157)/(27-54) 93/33 mmHg (11/06 1100) FiO2 (%):  [40 %-100 %] 40 % (11/06 1109) Weight:  [69.5 kg (153 lb 3.5 oz)-72 kg (158 lb 11.7 oz)] 69.5 kg (153 lb 3.5 oz) (11/06 1104) HEMODYNAMICS:   VENTILATOR SETTINGS: Vent Mode:  [-] PSV;CPAP FiO2 (%):  [40 %-100 %] 40 % Set Rate:  [14 bmp] 14 bmp Vt Set:  [500 mL] 500 mL PEEP:  [5 cmH20] 5 cmH20 Pressure Support:  [5 cmH20-8 cmH20] 5 cmH20 Plateau Pressure:  [19 cmH20-22 cmH20] 19 cmH20 INTAKE / OUTPUT:  Intake/Output Summary (Last 24 hours) at 07/29/14 1216 Last data filed at 07/29/14 1106  Gross per 24 hour  Intake  1421.41 ml  Output   2322 ml  Net -900.59 ml    PHYSICAL EXAMINATION: General:  Frail and on vent  Neuro:  Sedated RASS -2 on precedex HEENT:  ETT in place Cardiovascular:  RRR, no m/r/g On levophed Lungs:  Very distant BS, prolonged exp, no wheezes noted. Doing SBT Abdomen:  Soft, NT, ND Ext:  Warm and dry, no edema  LABS:  PULMONARY  Recent Labs Lab 07/28/14 0807 07/28/14 1612  PHART 7.047* 7.248*  PCO2ART 113.0* 68.2*  PO2ART 56.1* 238.0*  HCO3 29.5* 29.7*  TCO2 33.0 32  O2SAT 82.6 100.0    CBC  Recent Labs Lab 07/27/14 1947 07/28/14 0535 07/29/14 0630  HGB 9.7* 10.2* 9.8*  HCT 33.4* 36.5 32.3*  WBC 5.9 6.7 7.1  PLT 178 163 167    COAGULATION No results for input(s): INR in the last 168 hours.  CARDIAC   Recent Labs Lab 07/29/14 0455  TROPONINI <0.30   No results for input(s): PROBNP in the last 168 hours.   CHEMISTRY  Recent Labs Lab 07/27/14 1845  07/27/14 1948 07/28/14 0555 07/29/14 0455  NA  --   --  138 140 140  K  --   < > 4.1 4.6 3.6*  CL  --   --  95* 99 100  CO2  --   --  30 27 25   GLUCOSE  --   --  127* 112* 79  BUN  --   --  32* 20 31*  CREATININE 9.10*  --  7.96* 6.43* 7.51*  CALCIUM  --   --  7.5* 7.4* 6.8*  PHOS  --   --  6.9* 6.5*  --   < > = values in this interval not displayed. Estimated Creatinine Clearance: 5.6 mL/min (by C-G formula based on Cr of 7.51).   LIVER  Recent Labs Lab 07/27/14 1948 07/28/14 0555 07/29/14 0455  AST  --   --  19  ALT  --   --  <5  ALKPHOS  --   --  36*  BILITOT  --   --  0.4  PROT  --   --  5.8*  ALBUMIN 2.8* 2.9* 2.4*     INFECTIOUS No results for input(s): LATICACIDVEN, PROCALCITON in the last 168 hours.   ENDOCRINE CBG (last 3)   Recent Labs  07/28/14 1802 07/28/14 2058 07/29/14 0842  GLUCAP 140* 90 106*         IMAGING x48h Dg Chest Port 1 View  07/29/2014   CLINICAL DATA:  Respiratory failure.  Shortness of breath.  EXAM: PORTABLE CHEST - 1 VIEW   COMPARISON:  07/28/2014  FINDINGS: Endotracheal tube is in place with tip 2.5 cm above carina. Nasogastric tube is in place with tip off the film but the on the gastroesophageal junction. Right IJ central line tip overlies the level of the superior vena cava. Left IJ central line tip overlies the level of the brachiocephalic vein confluence with the superior vena cava.  Heart is enlarged. There is dense opacity at the left lung base which obscures the hemidiaphragm. Opacity has increased over recent exams. There is mild perihilar edema.  IMPRESSION: 1. Dense left lower lobe consolidation, increased. 2. Cardiomegaly and mild edema.   Electronically Signed   By: Rosalie Gums M.D.   On: 07/29/2014 07:10   Dg Chest Port 1 View  07/28/2014   CLINICAL DATA:  Evaluate ET tube placement  EXAM: PORTABLE CHEST - 1 VIEW  COMPARISON:  07/28/2014  FINDINGS: There is a left IJ catheter with tip in the projection of the SVC. Right-sided dialysis catheter is noted with tip in the cavoatrial junction. The ET tube tip is at the carina. There is a nasogastric tube with side port below GE junction. Stable cardiac enlargement. Calcified plaque is noted within the thoracic aorta. There is a moderate left pleural effusion noted.  IMPRESSION: 1. Support apparatus positioned as described above. 2. Cardiac enlargement and CHF.  Unchanged.   Electronically Signed   By: Signa Kell M.D.   On: 07/28/2014 10:08   Dg Chest Port 1 View  07/28/2014   CLINICAL DATA:  Acute onset shortness of breath. End-stage renal disease on hemodialysis.  EXAM: PORTABLE CHEST - 1 VIEW  COMPARISON:  Two-view chest x-ray yesterday, 07/05/2014, 03/17/2014.  FINDINGS: Cardiac silhouette markedly enlarged but stable. Pulmonary venous hypertension without overt pulmonary edema. Bilateral pleural effusions, left greater than right. Prominent paracardiac fat pad on the left mimics a lingular opacity, but this has been present on multiple prior examinations.  Dialysis catheter tips project over the lower SVC and cavoatrial junction.  IMPRESSION: Stable marked cardiomegaly. Pulmonary venous hypertension without overt edema. Bilateral pleural effusions, left greater than right. No acute cardiopulmonary disease otherwise.   Electronically Signed   By: Hulan Saas M.D.   On: 07/28/2014 08:56        ASSESSMENT / PLAN:  PULMONARY OETT 11/05>>> A: Acute on chronic hypercarbic resp failure - r/t COPD   -  Likely LL PNA on cXR. Doing SBT but mental status and pressor needs preclude extubation  P:   Cont full vent support - settings reviewed and/or adjusted Cont vent bundle Daily SBT if/when meets criteria  CARDIOVASCULAR CVL L IJ 11/05>>> A:  Hx CHF Hx A fib   - On levophed and precedex.  Low diastolic +  P:  Systolic range  > 95 and MAP > 50 on account of ESRD and low diastolic Cont amiodarone 200mg  daily ASA 81mg  daily Home Plavix held  RENAL A:   ESRD via R tunneled HD cath P:   Dialysis per nephrology  GASTROINTESTINAL A:   No acute issues P:   SUP: enteral PPI Consider TFs 11/07 if still intubated  HEMATOLOGIC A:   Mild Anemia without overt blood loss P:  DVT px: SQ heparin Monitor CBC intermittently Transfuse per usual ICU guidelines  INFECTIOUS A:   CXR 07/29/14 suggests LLL penumonia P:   BCx2 11/04 >> UC 11/04 >> Sputum 11/05 >>  Rx vanc and cefepime 07/29/14>>  ENDOCRINE A:   No acute issues P:   Monitor glucose  NEUROLOGIC A:   Sedated P:   RASS goal: 0 Daily WUA Tylenol prn, fent prn Lorazepam prn   FAMILY  - Updates: None present at bedside  - Inter-disciplinary family meet or Palliative Care meeting due by:  11/12    TODAY'S SUMMARY: 53F with history atrial fibrillation, CHF, COPD on home O2, ESRD on HD MWF, HTN [facility] 07[facility]27/2014 with acute encephalopathy now with acute on chronic respiratory failure. Intubated. Scheduled Budesonide/Brovana neb and  Duoneb. Will start Abx Rx     The patient is critically ill with multiple organ systems failure and requires high complexity decision making for assessment and support, frequent evaluation and titration of therapies, application of advanced monitoring technologies and extensive interpretation of multiple databases.   Critical Care Time devoted to patient care services described in this note is  30  Minutes. This time reflects time of care of this signee Dr Kalman ShanMurali Andrik Sandt. This critical care time does not reflect procedure time, or teaching time or supervisory time of PA/NP/Med student/Med Resident etc but could involve care discussion time    Dr. Kalman ShanMurali Indya Oliveria, M.D., [facility] Pulmonary and Critical Care Medicine Staff Physician [facility] Pager: [phone], If no answer or between  15:00h - 7:00h: call [phone]  07/29/2014 12:26 PM

## 2014-07-29 NOTE — Progress Notes (Signed)
ANTIBIOTIC CONSULT NOTE - Follow up  Pharmacy Consult for Cefepime, Vancomycin Indication: PNA  No Known Allergies  Patient Measurements: Height: 5\' 5"  (165.1 cm) (Simultaneous filing. User may not have seen previous data.) Weight: 153 lb 3.5 oz (69.5 kg) IBW/kg (Calculated) : 57    Microbiology: Recent Results (from the past 720 hour(s))  Culture, Urine     Status: None   Collection Time: 07/27/14  6:23 PM  Result Value Ref Range Status   Specimen Description URINE, RANDOM  Final   Special Requests NONE  Final   Culture  Setup Time   Final    07/27/2014 22:15 Performed at [facility]    Colony Count NO GROWTH Performed at [facility]   Final   Culture NO GROWTH Performed at [facility]   Final   Report Status 07/28/2014 FINAL  Final  Culture, blood (routine x 2)     Status: None (Preliminary result)   Collection Time: 07/27/14  6:50 PM  Result Value Ref Range Status   Specimen Description BLOOD RIGHT ARM  Final   Special Requests BOTTLES DRAWN AEROBIC ONLY 5CC  Final   Culture  Setup Time   Final    07/27/2014 22:20 Performed at [facility]    Culture   Final           BLOOD CULTURE RECEIVED NO GROWTH TO DATE CULTURE WILL BE HELD FOR 5 DAYS BEFORE ISSUING A FINAL NEGATIVE REPORT Performed at [facility]    Report Status PENDING  Incomplete  MRSA PCR Screening     Status: None   Collection Time: 07/27/14  6:51 PM  Result Value Ref Range Status   MRSA by PCR NEGATIVE NEGATIVE Final    Comment:        The GeneXpert MRSA Assay (FDA approved for NASAL specimens only), is one component of a comprehensive MRSA colonization surveillance program. It is not intended to diagnose MRSA infection nor to guide or monitor treatment for MRSA infections.   Culture, blood (routine x 2)     Status: None (Preliminary result)   Collection Time: 07/27/14  6:55 PM  Result Value Ref Range Status   Specimen Description BLOOD RIGHT ARM   Final   Special Requests BOTTLES DRAWN AEROBIC ONLY 5CC  Final   Culture  Setup Time   Final    07/27/2014 22:23 Performed at [facility]    Culture   Final           BLOOD CULTURE RECEIVED NO GROWTH TO DATE CULTURE WILL BE HELD FOR 5 DAYS BEFORE ISSUING A FINAL NEGATIVE REPORT Performed at [facility]    Report Status PENDING  Incomplete  Culture, respiratory (NON-Expectorated)     Status: None (Preliminary result)   Collection Time: 07/28/14 10:25 AM  Result Value Ref Range Status   Specimen Description TRACHEAL ASPIRATE  Final   Special Requests NONE  Final   Gram Stain   Final    RARE WBC PRESENT,BOTH PMN AND MONONUCLEAR RARE SQUAMOUS EPITHELIAL CELLS PRESENT RARE GRAM POSITIVE COCCI IN PAIRS IN CLUSTERS RARE YEAST Performed at [facility]    Culture   Final    Culture reincubated for better growth Performed at [facility]    Report Status PENDING  Incomplete    Medical History: Past Medical History  Diagnosis Date  . Hypertension   . Hyperlipidemia   . COPD (chronic obstructive pulmonary disease)   . Anemia   .  ESRD (end stage renal disease) on dialysis   . CHF (congestive heart failure)   . GERD (gastroesophageal reflux disease)   . Constipation   . PEA (Pulseless electrical activity) 04/19/2012    PEA arrest complicated by hyperkalemia, PNA, anocic encephalopathy, VDRF    Assessment: 80 year old female with a past medical history significant for afib, COPD on home oyxgen and ESRD (MWF HD).  New LLL PNA (11/6). WBC WNL, currently afebrile. Given Vanc 1g LD? and 750 mg QHD, cefepime 2g QHD on 11/4.   11/4 BCx >> NGTD  11/4 UCx >> NGF 11/5 TA >> rare G+ cocci in pairs/clusters, rare yeast   Goal of Therapy:  Appropriate antibiotic dosing Target pre-HD level: 15-25 mcg/mL  Plan:  Cefepime 2 grams IV QHD   Vancomycin 750 IV QHD Follow up progress, fever trend, cultures, levels on Monday (11/9)  [place]k RiverAlam, Ruhaniyah,  Pharm.D Candidate 07/29/2014,2:13 PM  I have reviewed and agree with above assessment and plan. Upon looking at documentation- the Vancomycin 1g was not documented as given on 11/4 so we are unsure if the patient received this dose which is why a level is recommended for Monday.  Link SnufferJessica Dalessandro Baldyga, PharmD, BCPS Clinical Pharmacist [phone] 07/29/2014, 3:20 PM

## 2014-07-30 ENCOUNTER — Inpatient Hospital Stay (HOSPITAL_COMMUNITY): Payer: Medicare Other

## 2014-07-30 DIAGNOSIS — I359 Nonrheumatic aortic valve disorder, unspecified: Secondary | ICD-10-CM

## 2014-07-30 LAB — BLOOD GAS, ARTERIAL
Acid-Base Excess: 1.6 mmol/L (ref 0.0–2.0)
Bicarbonate: 25.4 mEq/L — ABNORMAL HIGH (ref 20.0–24.0)
FIO2: 0.4 %
LHR: 14 {breaths}/min
O2 Saturation: 97.4 %
PCO2 ART: 37.6 mmHg (ref 35.0–45.0)
PEEP: 5 cmH2O
PH ART: 7.444 (ref 7.350–7.450)
Patient temperature: 98.6
TCO2: 26.5 mmol/L (ref 0–100)
VT: 500 mL
pO2, Arterial: 89 mmHg (ref 80.0–100.0)

## 2014-07-30 LAB — BASIC METABOLIC PANEL
ANION GAP: 15 (ref 5–15)
BUN: 19 mg/dL (ref 6–23)
CO2: 24 mEq/L (ref 19–32)
CREATININE: 5.11 mg/dL — AB (ref 0.50–1.10)
Calcium: 7 mg/dL — ABNORMAL LOW (ref 8.4–10.5)
Chloride: 99 mEq/L (ref 96–112)
GFR calc non Af Amer: 7 mL/min — ABNORMAL LOW (ref >90)
GFR, EST AFRICAN AMERICAN: 8 mL/min — AB (ref >90)
Glucose, Bld: 84 mg/dL (ref 70–99)
Potassium: 3.7 mEq/L (ref 3.7–5.3)
Sodium: 138 mEq/L (ref 137–147)

## 2014-07-30 LAB — CBC WITH DIFFERENTIAL/PLATELET
BASOS ABS: 0 10*3/uL (ref 0.0–0.1)
Basophils Relative: 0 % (ref 0–1)
EOS ABS: 0.2 10*3/uL (ref 0.0–0.7)
Eosinophils Relative: 2 % (ref 0–5)
HCT: 30.6 % — ABNORMAL LOW (ref 36.0–46.0)
HEMOGLOBIN: 9.4 g/dL — AB (ref 12.0–15.0)
Lymphocytes Relative: 16 % (ref 12–46)
Lymphs Abs: 1.2 10*3/uL (ref 0.7–4.0)
MCH: 29.7 pg (ref 26.0–34.0)
MCHC: 30.7 g/dL (ref 30.0–36.0)
MCV: 96.8 fL (ref 78.0–100.0)
MONOS PCT: 11 % (ref 3–12)
Monocytes Absolute: 0.8 10*3/uL (ref 0.1–1.0)
NEUTROS PCT: 71 % (ref 43–77)
Neutro Abs: 5.5 10*3/uL (ref 1.7–7.7)
Platelets: 166 10*3/uL (ref 150–400)
RBC: 3.16 MIL/uL — ABNORMAL LOW (ref 3.87–5.11)
RDW: 14.5 % (ref 11.5–15.5)
WBC: 7.7 10*3/uL (ref 4.0–10.5)

## 2014-07-30 LAB — PHOSPHORUS: Phosphorus: 2.9 mg/dL (ref 2.3–4.6)

## 2014-07-30 LAB — CULTURE, RESPIRATORY W GRAM STAIN

## 2014-07-30 LAB — MAGNESIUM: Magnesium: 1.9 mg/dL (ref 1.5–2.5)

## 2014-07-30 LAB — GLUCOSE, CAPILLARY
Glucose-Capillary: 87 mg/dL (ref 70–99)
Glucose-Capillary: 86 mg/dL (ref 70–99)

## 2014-07-30 LAB — CULTURE, RESPIRATORY

## 2014-07-30 LAB — PROCALCITONIN: Procalcitonin: 1.2 ng/mL

## 2014-07-30 MED ORDER — CETYLPYRIDINIUM CHLORIDE 0.05 % MT LIQD
7.0000 mL | Freq: Two times a day (BID) | OROMUCOSAL | Status: DC
  Administered 2014-07-30 – 2014-07-31 (×3): 7 mL via OROMUCOSAL

## 2014-07-30 MED ORDER — ALBUMIN HUMAN 25 % IV SOLN
25.0000 g | Freq: Once | INTRAVENOUS | Status: AC
  Administered 2014-07-30: 25 g via INTRAVENOUS
  Filled 2014-07-30: qty 100

## 2014-07-30 MED ORDER — ALBUMIN HUMAN 25 % IV SOLN
INTRAVENOUS | Status: AC
  Filled 2014-07-30: qty 100

## 2014-07-30 MED ORDER — DEXTROSE 5 % IV SOLN
2.0000 g | Freq: Once | INTRAVENOUS | Status: AC
  Administered 2014-07-30: 2 g via INTRAVENOUS
  Filled 2014-07-30: qty 2

## 2014-07-30 MED ORDER — CHLORHEXIDINE GLUCONATE 0.12 % MT SOLN
15.0000 mL | Freq: Two times a day (BID) | OROMUCOSAL | Status: DC
  Administered 2014-07-30 – 2014-08-01 (×3): 15 mL via OROMUCOSAL
  Filled 2014-07-30 (×3): qty 15

## 2014-07-30 NOTE — Consult Note (Signed)
PULMONARY / CRITICAL CARE MEDICINE   Name: [name] MRN: [id number] DOB: 1928-12-29    ADMISSION DATE:  07/27/2014   CONSULTATION DATE:  07/28/2014  REFERRING MD :  [facility]  INITIAL PRESENTATION:  Acute encephalopathy, acute respiratory failure   50F with history atrial fibrillation, COPD on home O2, ESRD on HD MWF, HTN transferred from [facility] 07/27/2014 with acute encephalopathy. She has had worsening weakness and decreased appetite over the past few weeks. She has increasing confusion over the past few days. She was recently started on Remeron by PCP about 3 weeks ago. At [facility] she was found to have a UTI. She was transferred to [facility]. Admitted by [facility] and started on empiric antibiotics for UTI. She had acute on chronic respiratory failure and PCCM consulted.   SIGNIFICANT EVENTS: 07/27/2014 - [facility]  Acute respiratory failure requiring intubation, LIJ CVC placed 07/29/14: Frail. On levophed low dose and precedex    SUBJECTIVE/OVERNIGHT/INTERVAL HX 07/30/14: off pressors and precedex. Doing well on SBt; meets extubation criteria  HISTORY OF PRESENT ILLNESS: VITAL SIGNS: Temp:  [98.1 F (36.7 C)-99.2 F (37.3 C)] 98.7 F (37.1 C) (11/07 0756) Pulse Rate:  [58-108] 81 (11/07 1124) Resp:  [13-31] 31 (11/07 1124) BP: (51-140)/(32-110) 127/110 mmHg (11/07 1124) SpO2:  [93 %-100 %] 100 % (11/07 1124) Arterial Line BP: (83-154)/(27-56) 154/56 mmHg (11/07 0800) FiO2 (%):  [40 %] 40 % (11/07 1124) Weight:  [70.3 kg (154 lb 15.7 oz)] 70.3 kg (154 lb 15.7 oz) (11/07 0630) HEMODYNAMICS:   VENTILATOR SETTINGS: Vent Mode:  [-] PSV;CPAP FiO2 (%):  [40 %] 40 % Set Rate:  [14 bmp-15 bmp] 14 bmp Vt Set:  [500 mL] 500 mL PEEP:  [5 cmH20] 5 cmH20 Pressure Support:  [5 cmH20] 5 cmH20 Plateau Pressure:  [18 cmH20-28 cmH20] 18 cmH20 INTAKE / OUTPUT:  Intake/Output Summary (Last 24 hours) at 07/30/14 1139 Last data  filed at 07/30/14 0800  Gross per 24 hour  Intake 627.06 ml  Output    350 ml  Net 277.06 ml    PHYSICAL EXAMINATION: General:  Frail and on vent  Neuro:  RASS 0. CAM-IC neg for deliirium. Moves all 4s.Off sedation HEENT:  ETT in place Cardiovascular:  RRR, no m/r/g On levophed Lungs:  Very distant BS, prolonged exp, no wheezes noted. Doing SBT Abdomen:  Soft, NT, ND Ext:  Warm and dry, no edema  LABS:  PULMONARY  Recent Labs Lab 07/28/14 0807 07/28/14 1612 07/29/14 1247 07/30/14 0349  PHART 7.047* 7.248* 7.393 7.444  PCO2ART 113.0* 68.2* 45.8* 37.6  PO2ART 56.1* 238.0* 88.0 89.0  HCO3 29.5* 29.7* 28.0* 25.4*  TCO2 33.0 32 29 26.5  O2SAT 82.6 100.0 97.0 97.4    CBC  Recent Labs Lab 07/28/14 0535 07/29/14 0630 07/30/14 0409  HGB 10.2* 9.8* 9.4*  HCT 36.5 32.3* 30.6*  WBC 6.7 7.1 7.7  PLT 163 167 166    COAGULATION No results for input(s): INR in the last 168 hours.  CARDIAC    Recent Labs Lab 07/29/14 0455  TROPONINI <0.30   No results for input(s): PROBNP in the last 168 hours.   CHEMISTRY  Recent Labs Lab 07/27/14 1845  07/27/14 1948 07/28/14 0555 07/29/14 0455 07/30/14 0409  NA  --   --  138 140 140 138  K  --   < > 4.1 4.6 3.6* 3.7  CL  --   --  95* 99 100 99  CO2  --   --  30 27 25 24   GLUCOSE  --   --  127* 112* 79 84  BUN  --   --  32* 20 31* 19  CREATININE 9.10*  --  7.96* 6.43* 7.51* 5.11*  CALCIUM  --   --  7.5* 7.4* 6.8* 7.0*  MG  --   --   --   --   --  1.9  PHOS  --   --  6.9* 6.5*  --  2.9  < > = values in this interval not displayed. Estimated Creatinine Clearance: 8.2 mL/min (by C-G formula based on Cr of 5.11).   LIVER  Recent Labs Lab 07/27/14 1948 07/28/14 0555 07/29/14 0455  AST  --   --  19  ALT  --   --  <5  ALKPHOS  --   --  36*  BILITOT  --   --  0.4  PROT  --   --  5.8*  ALBUMIN 2.8* 2.9* 2.4*     INFECTIOUS  Recent Labs Lab 07/29/14 1332 07/30/14 0409  PROCALCITON 0.63 1.20      ENDOCRINE CBG (last 3)   Recent Labs  07/29/14 1628 07/29/14 2012 07/29/14 2312  GLUCAP 106* 98 87         IMAGING x48h Dg Chest Port 1 View  07/30/2014   CLINICAL DATA:  80 year old female currently admitted with acute respiratory failure. Subsequent encounter.  EXAM: PORTABLE CHEST - 1 VIEW  COMPARISON:  Multiple recent prior chest x-rays. Most recent prior chest x-ray 07/29/2014  FINDINGS: The tip of the endotracheal tube is 2.2 cm above the carina. The proximal side hole of the nasogastric tube overlies the gastric fundus. A left IJ approach central venous catheter has advanced. The tip now overlies the mid SVC in good position. Right IJ approach tunneled hemodialysis catheter. Catheter tip at the superior cavoatrial junction in good position.  Stable enlargement of the cardiopericardial silhouette. Probable left pleural effusion and associated atelectasis. Slight interval improvement in pulmonary vascular congestion. Inspiratory volumes remain low. No pneumothorax.  IMPRESSION: 1. The left IJ central venous catheter has advanced so that the tip now overlies the mid SVC. Other support apparatus remain in unchanged position. 2. Resolved pulmonary vascular congestion. 3. Persistent globular enlargement of the cardiopericardial silhouette which may reflect cardiomegaly and/or pericardial effusion. 4. Persistent low inspiratory volumes with left greater than right basilar atelectasis. 5. Suspect layering left pleural effusion.   Electronically Signed   By: Malachy MoanHeath  McCullough M.D.   On: 07/30/2014 07:39   Dg Chest Port 1 View  07/29/2014   CLINICAL DATA:  Respiratory failure.  Shortness of breath.  EXAM: PORTABLE CHEST - 1 VIEW  COMPARISON:  07/28/2014  FINDINGS: Endotracheal tube is in place with tip 2.5 cm above carina. Nasogastric tube is in place with tip off the film but the on the gastroesophageal junction. Right IJ central line tip overlies the level of the superior vena cava. Left  IJ central line tip overlies the level of the brachiocephalic vein confluence with the superior vena cava.  Heart is enlarged. There is dense opacity at the left lung base which obscures the hemidiaphragm. Opacity has increased over recent exams. There is mild perihilar edema.  IMPRESSION: 1. Dense left lower lobe consolidation, increased. 2. Cardiomegaly and mild edema.   Electronically Signed   By: Rosalie GumsBeth  Brown M.D.   On: 07/29/2014 07:10        ASSESSMENT / PLAN:  PULMONARY OETT 11/05>>> A: Acute on chronic  hypercarbic resp failure - r/t COPD and  Likely LL PNA baed on cXR.   - 07/30/14: meets extubation criteria  P:   Extubate   CARDIOVASCULAR CVL L IJ 11/05>>> A:  Hx CHF Hx A fib   -  Low diastolic +. Off pressors  P:  Systolic range  > 95 and MAP > 50 on account of ESRD and low diastolic Cont amiodarone 200mg  daily ASA 81mg  daily Home Plavix held  RENAL A:   ESRD via R tunneled HD cath P:   Dialysis per nephrology  GASTROINTESTINAL A:   No acute issues P:   SUP: enteral PPI DC tube feeds   HEMATOLOGIC A:   Mild Anemia without overt blood loss P:  DVT px: SQ heparin Monitor CBC intermittently Transfuse per usual ICU guidelines  INFECTIOUS BCx2 11/04 >> UC 11/04 >> Sputum 11/05 >>  A:   CXR 07/29/14 suggests LLL penumonia  P:   Rx dc vanc  Continue cefepime 07/29/14>>  ENDOCRINE A:   No acute issues P:   Monitor glucose  NEUROLOGIC A:   Normal mental status. For extubation P:   Dc precedex Dc fent Dc versed  FAMILY  - Updates: None present at bedside  - Inter-disciplinary family meet or Palliative Care meeting due by:  11/12 if still in ICU    TODAY'S SUMMARY: 22F with history atrial fibrillation, CHF, COPD on home O2, ESRD on HD MWF, HTN [facility]2014 with acute encephalopathy now with acute on chronic respiratory failure due to LLL PNA.Exdtubated 07/30/2014.STill at high risk for decopensation  next 24h   The patient is critically ill with multiple organ systems failure and requires high complexity decision making for assessment and support, frequent evaluation and titration of therapies, application of advanced monitoring technologies and extensive interpretation of multiple databases.   Critical Care Time devoted to patient care services described in this note is  30  Minutes. This time reflects time of care of this signee Dr Kalman ShanMurali Devontre Siedschlag. This critical care time does not reflect procedure time, or teaching time or supervisory time of PA/NP/Med student/Med Resident etc but could involve care discussion time    Dr. Kalman ShanMurali Lira Stephen, M.D., [facility].P Pulmonary and Critical Care Medicine Staff Physician [facility] Pager: [phone], If no answer or between  15:00h - 7:00h: call [phone]  07/30/2014 11:39 AM

## 2014-07-30 NOTE — Plan of Care (Signed)
Problem: Consults Goal: Skin Care Protocol Initiated - if Braden Score 18 or less If consults are not indicated, leave blank or document N/A  Outcome: Completed/Met Date Met:  07/30/14 Goal: Diabetes Guidelines if Diabetic/Glucose > 140 If diabetic or lab glucose is > 140 mg/dl - Initiate Diabetes/Hyperglycemia Guidelines & Document Interventions  Outcome: Not Applicable Date Met:  87/86/76  Problem: Phase I Progression Outcomes Goal: Pain controlled with appropriate interventions Outcome: Completed/Met Date Met:  07/30/14 Goal: Dyspnea controlled at rest Outcome: Completed/Met Date Met:  07/30/14 Goal: Skin without signs of pressure Outcome: Completed/Met Date Met:  07/30/14

## 2014-07-30 NOTE — Progress Notes (Signed)
Echo Lab  2D Echocardiogram completed.  Emberlie Gotcher L Tiffiany Beadles, RDCS 07/30/2014 3:13 PM

## 2014-07-30 NOTE — Progress Notes (Signed)
[facility] Progress Note   Subjective: more alert, CXR w resolved vasc congestion, globular heart, LLL process  Filed Vitals:   07/30/14 0745 07/30/14 0746 07/30/14 0756 07/30/14 0800  BP:  132/49  140/53  Pulse:  84  108  Temp:   98.7 F (37.1 C)   TempSrc:   Oral   Resp:  31  30  Height:      Weight:      SpO2: 96%   99%   Exam: ON vent, alert No jvd Chest no ant rales or wheezing RRR no MRG Abd soft, NTND No LE or UE edema R IJ cath in place Neuro alert, looks much better  HD: MWF [place] 3.5h   69kg   2/2.25 BAth  Heparin 3000  R IJ cath Aranesp 25 ug every 2 weeks, Calcitriol 1.0 ug TIW pth 991, Ca 7.4, P 4.5        Assessment: 1 Acute on chronic resp failure - combination underlying COPD/ LLL process / vol overload 2 ESRD on HD 3 HTN - on 3 BP meds at home, on hold  4 Volume - vol excess, better after HD 4 Anemia Hb 10.2, cont darbe q other wk, Hb 9.8 5 HPTH cont vit D, no binder on med list 6 COPD on home O2 7 Hx PEA arrest Jul 2013 8 Access - hx failed L RC AVF, hx prior [facility]; [facility]   Plan- short HD again today, decrease vol further    Vinson Moselleob Gypsy Kellogg MD  pager [phone]    cell [phone]  07/30/2014, 10:57 AM     Recent Labs Lab 07/27/14 1948 07/28/14 0555 07/29/14 0455 07/30/14 0409  NA 138 140 140 138  K 4.1 4.6 3.6* 3.7  CL 95* 99 100 99  CO2 30 27 25 24   GLUCOSE 127* 112* 79 84  BUN 32* 20 31* 19  CREATININE 7.96* 6.43* 7.51* 5.11*  CALCIUM 7.5* 7.4* 6.8* 7.0*  PHOS 6.9* 6.5*  --  2.9    Recent Labs Lab 07/27/14 1948 07/28/14 0555 07/29/14 0455  AST  --   --  19  ALT  --   --  <5  ALKPHOS  --   --  36*  BILITOT  --   --  0.4  PROT  --   --  5.8*  ALBUMIN 2.8* 2.9* 2.4*    Recent Labs Lab 07/28/14 0535 07/29/14 0630 07/30/14 0409  WBC 6.7 7.1 7.7  NEUTROABS  --   --  5.5  HGB 10.2* 9.8* 9.4*  HCT 36.5 32.3* 30.6*  MCV 108.6* 99.1 96.8  PLT 163 167 166   . amiodarone   200 mg Per Tube Daily  . antiseptic oral rinse  7 mL Mouth Rinse QID  . arformoterol  15 mcg Nebulization BID  . aspirin  81 mg Per Tube Daily  . budesonide  0.5 mg Nebulization BID  . [START ON 08/01/2014] ceFEPime (MAXIPIME) IV  2 g Intravenous Q M,W,F-HD  . chlorhexidine  15 mL Mouth Rinse BID  . [START ON 08/10/2014] darbepoetin (ARANESP) injection - DIALYSIS  25 mcg Intravenous Q14 Days  . heparin  5,000 Units Subcutaneous 3 times per day  . ipratropium-albuterol  3 mL Nebulization Q6H  . pantoprazole sodium  40 mg Per Tube Q1200  . sodium chloride  3 mL Intravenous Q12H  . [START ON 08/01/2014] vancomycin  750 mg Intravenous Q M,W,F-HD   . dexmedetomidine Stopped (  07/29/14 1523)  . norepinephrine (LEVOPHED) Adult infusion Stopped (07/30/14 0818)   sodium chloride, sodium chloride, sodium chloride, Place/Maintain arterial line **AND** sodium chloride, acetaminophen **OR** [DISCONTINUED] acetaminophen, albuterol, feeding supplement (NEPRO CARB STEADY), fentaNYL, heparin, lidocaine (PF), lidocaine-prilocaine, midazolam, ondansetron **OR** ondansetron (ZOFRAN) IV, pentafluoroprop-tetrafluoroeth, sodium chloride

## 2014-07-30 NOTE — Procedures (Signed)
Extubation Procedure Note  Patient Details:   Name: [name] PieriniRuth Marks DOB: Apr 19, 1929 MRN: [id number]   Airway Documentation:     Evaluation  O2 sats: stable throughout Complications: No apparent complications Patient did tolerate procedure well. Bilateral Breath Sounds: Clear Suctioning: Airway Yes   Patient extubated to 4L nasal cannula.  Positive cuff leak.  Sats currently 99%.  Vitals are stable.  No evidence of stridor.  Patient able to speak post extubation.  Ledell NossBrown, Dovie Kapusta N 07/30/2014, 11:46 AM

## 2014-07-31 DIAGNOSIS — J440 Chronic obstructive pulmonary disease with acute lower respiratory infection: Secondary | ICD-10-CM

## 2014-07-31 LAB — PHOSPHORUS: Phosphorus: 3.3 mg/dL (ref 2.3–4.6)

## 2014-07-31 LAB — BASIC METABOLIC PANEL
Anion gap: 12 (ref 5–15)
BUN: 17 mg/dL (ref 6–23)
CHLORIDE: 103 meq/L (ref 96–112)
CO2: 28 meq/L (ref 19–32)
CREATININE: 4.4 mg/dL — AB (ref 0.50–1.10)
Calcium: 7.3 mg/dL — ABNORMAL LOW (ref 8.4–10.5)
GFR calc Af Amer: 10 mL/min — ABNORMAL LOW (ref >90)
GFR calc non Af Amer: 8 mL/min — ABNORMAL LOW (ref >90)
Glucose, Bld: 66 mg/dL — ABNORMAL LOW (ref 70–99)
Potassium: 3.8 mEq/L (ref 3.7–5.3)
Sodium: 143 mEq/L (ref 137–147)

## 2014-07-31 LAB — CBC WITH DIFFERENTIAL/PLATELET
Basophils Absolute: 0 10*3/uL (ref 0.0–0.1)
Basophils Relative: 0 % (ref 0–1)
Eosinophils Absolute: 0.2 10*3/uL (ref 0.0–0.7)
Eosinophils Relative: 2 % (ref 0–5)
HEMATOCRIT: 30.2 % — AB (ref 36.0–46.0)
Hemoglobin: 9.2 g/dL — ABNORMAL LOW (ref 12.0–15.0)
LYMPHS PCT: 11 % — AB (ref 12–46)
Lymphs Abs: 1 10*3/uL (ref 0.7–4.0)
MCH: 30.4 pg (ref 26.0–34.0)
MCHC: 30.5 g/dL (ref 30.0–36.0)
MCV: 99.7 fL (ref 78.0–100.0)
MONO ABS: 0.8 10*3/uL (ref 0.1–1.0)
MONOS PCT: 10 % (ref 3–12)
Neutro Abs: 6.9 10*3/uL (ref 1.7–7.7)
Neutrophils Relative %: 77 % (ref 43–77)
Platelets: 140 10*3/uL — ABNORMAL LOW (ref 150–400)
RBC: 3.03 MIL/uL — ABNORMAL LOW (ref 3.87–5.11)
RDW: 14.8 % (ref 11.5–15.5)
WBC: 8.9 10*3/uL (ref 4.0–10.5)

## 2014-07-31 LAB — MAGNESIUM: Magnesium: 1.9 mg/dL (ref 1.5–2.5)

## 2014-07-31 LAB — PROCALCITONIN: Procalcitonin: 1.38 ng/mL

## 2014-07-31 MED ORDER — PANTOPRAZOLE SODIUM 40 MG PO TBEC
40.0000 mg | DELAYED_RELEASE_TABLET | Freq: Every day | ORAL | Status: DC
  Administered 2014-08-01 – 2014-08-05 (×4): 40 mg via ORAL
  Filled 2014-07-31 (×3): qty 1

## 2014-07-31 MED ORDER — CALCITRIOL 0.5 MCG PO CAPS
1.0000 ug | ORAL_CAPSULE | ORAL | Status: DC
  Administered 2014-08-01 – 2014-08-05 (×3): 1 ug via ORAL
  Filled 2014-07-31 (×3): qty 2

## 2014-07-31 NOTE — Evaluation (Signed)
Physical Therapy Evaluation Patient Details Name: [name] PieriniRuth Casanova MRN: [id number] DOB: 08/07/1929 Today's Date: 07/31/2014   History of Present Illness  43F with history atrial fibrillation, COPD on home O2, ESRD on HD MWF, HTN [facility] with acute encephalopathy. She has had worsening weakness and decreased appetite over the past few weeks. She has increasing confusion over the past few days. She was recently started on Remeron by PCP about 3 weeks ago. [facility] she was found to have a UTI. [facility] She had acute on chronic respiratory failure and PCCM consulted.Required intubation on 11/5. Extubated 11/7.  Clinical Impression  Pt adm due to above. Presents with decreased functional mobility secondary to deficits indicated below (see PT problem list below). Pt to benefit from skilled acute PT to address deficits below and maximize functional mobility. Pt hopeful to D/C back to her apartment with family (A) PRN. Has rolator, may need to assess gt with RW.     Follow Up Recommendations Home health PT;Supervision/Assistance - 24 hour    Equipment Recommendations  None recommended by PT    Recommendations for Other Services OT consult     Precautions / Restrictions Precautions Precautions: Fall Restrictions Weight Bearing Restrictions: No      Mobility  Bed Mobility Overal bed mobility: Needs Assistance Bed Mobility: Supine to Sit     Supine to sit: Min assist     General bed mobility comments: handheld (A) to elevate trunk; pt able to advance LEs to/off EOB but generalized weakness in trunk requiring HHA   Transfers Overall transfer level: Needs assistance Equipment used: 1 person hand held assist Transfers: Sit to/from [employer]Stand;Stand Pivot Transfers Sit to Stand: Min assist Stand pivot transfers: Min assist       General transfer comment: handheld (A) to balance with standing; cues for safety; VSS  with pivotal steps   Ambulation/Gait             General Gait Details: pivotal steps to chair  Stairs            Wheelchair Mobility    Modified Rankin (Stroke Patients Only)       Balance Overall balance assessment: Needs assistance Sitting-balance support: Feet supported;No upper extremity supported Sitting balance-Leahy Scale: Fair Sitting balance - Comments: sat EOB at supervision level; mild c/o dizziness initially; subsided in ~5 min; VSS    Standing balance support: During functional activity;Single extremity supported Standing balance-Leahy Scale: Poor Standing balance comment: handheld (A) to balance; c/o slight dizziness initially                              Pertinent Vitals/Pain Pain Assessment: No/denies pain    Home Living Family/patient expects to be discharged to:: Private residence Living Arrangements: Alone Available Help at Discharge: Family;Available PRN/intermittently Type of Home: Apartment Home Access: Level entry     Home Layout: One level Home Equipment: Walker - 4 wheels;Cane - single point      Prior Function Level of Independence: Independent         Comments: reports she was using cane for the past 2 weeks secondary to weakness; normally does not use AD; daughter and granddaughter checks on her daily and brings food to her; pt reports she does drive      Hand Dominance        Extremity/Trunk Assessment   Upper Extremity Assessment: Defer to OT evaluation  Lower Extremity Assessment: Generalized weakness      Cervical / Trunk Assessment: Normal  Communication   Communication: No difficulties;Other (comment) (faint speech due to intubation/sore throat)  Cognition Arousal/Alertness: Awake/alert Behavior During Therapy: WFL for tasks assessed/performed Overall Cognitive Status: Within Functional Limits for tasks assessed                      General Comments General comments  (skin integrity, edema, etc.): educated on importance of being OOB as tolerated      Exercises General Exercises - Lower Extremity Ankle Circles/Pumps: AROM;Both;10 reps;Supine Long Arc Quad: AROM;Strengthening;5 reps;Seated      Assessment/Plan    PT Assessment Patient needs continued PT services  PT Diagnosis Difficulty walking;Generalized weakness   PT Problem List Decreased strength;Decreased activity tolerance;Decreased balance;Decreased mobility;Decreased knowledge of use of DME;Cardiopulmonary status limiting activity  PT Treatment Interventions DME instruction;Gait training;Functional mobility training;Therapeutic activities;Balance training;Therapeutic exercise;Neuromuscular re-education;Patient/family education   PT Goals (Current goals can be found in the Care Plan section) Acute Rehab PT Goals Patient Stated Goal: to go back to my apartment  PT Goal Formulation: With patient Time For Goal Achievement: 08/07/14 Potential to Achieve Goals: Good    Frequency Min 3X/week   Barriers to discharge Decreased caregiver support pt does live alone; family checks in on her PRN     Co-evaluation               End of Session Equipment Utilized During Treatment: Gait belt;Oxygen Activity Tolerance: Patient limited by fatigue Patient left: in chair;with call bell/phone within reach Nurse Communication: Mobility status         Time: [id number] PT Time Calculation (min): 18 min   Charges:   PT Evaluation $Initial PT Evaluation Tier I: 1 Procedure PT Treatments $Therapeutic Activity: 8-22 mins   PT G CodesDonell Sievert:          Missy Baksh N, [place] CarolinaPT  [phone] 07/31/2014, 9:46 AM

## 2014-07-31 NOTE — Evaluation (Signed)
Clinical/Bedside Swallow Evaluation Patient Details  Name: [name] MRN: [id number] Date of Birth: July 25, 1929  Today's Date: 07/31/2014 Time: [id number] SLP Time Calculation (min): 39 min  Past Medical History:  Past Medical History  Diagnosis Date  . Hypertension   . Hyperlipidemia   . COPD (chronic obstructive pulmonary disease)   . Anemia   . ESRD (end stage renal disease) on dialysis   . CHF (congestive heart failure)   . GERD (gastroesophageal reflux disease)   . Constipation   . PEA (Pulseless electrical activity) 04/19/2012    PEA arrest complicated by hyperkalemia, PNA, anocic encephalopathy, VDRF   Past Surgical History:  Past Surgical History  Procedure Laterality Date  . Abdominal hysterectomy    . Arteriovenous fistula  06/02/12    left radiocephalic - failed  . Arteriovenous graft placement  07/14/12    Created at [facility]   HPI:  80 year old female admitted 07/27/14 due to increased confusion and weakness. PMH significant for COPD, ESRD, HTN, CHF, GERD, PEA with anoxic encephalopathy. Pt intubated 11/5-7/15. BSE ordered  to evaluate swallow function and safety and identify least restrictive diet.   Assessment / Plan / Recommendation Clinical Impression  Oral care completed with suction. Oral motor strength and function appear adequate. Pt reports no history of swallowing difficulty prior to admit, however, she has dx COPD which increases risk of silent aspiration. Pt also with GERD. Immediate cough response noted following thin liquids. Nectar, puree, and solid consistencies appear to be tolerated without overt s/s aspiration or clinical indication of airway compromise. Will begin with conservative diet of dys 2 finely chopped solids, nectar thick liquids. ST to follow for diet tolerance, and determine readiness to advance. Objective study may be beneficial prior to liberalizing diet to include thin liquids, due to dx COPD and associated difficulty coordinating breathing  and swallowing appropriately, as well as increased risk of silent aspiration.      Aspiration Risk  Moderate    Diet Recommendation Dysphagia 2 (Fine chop);Nectar-thick liquid   Liquid Administration via: Straw Medication Administration: Whole meds with liquid (meds one at a time with nectar thick or puree) Supervision: Full supervision/cueing for compensatory strategies Compensations: Slow rate;Small sips/bites Postural Changes and/or Swallow Maneuvers: Seated upright 90 degrees    Other  Recommendations Recommended Consults: MBS Oral Care Recommendations: Staff/trained caregiver to provide oral care;Oral care BID Other Recommendations: Order thickener from pharmacy;Have oral suction available   Follow Up Recommendations   (TBD)    Frequency and Duration min 2x/week  2 weeks   Pertinent Vitals/Pain VSS, no pain reported    SLP Swallow Goals  diet tolerance   Swallow Study Prior Functional Status  Type of Home: Apartment Available Help at Discharge: Family;Available PRN/intermittently    General Date of Onset: 07/27/14 HPI: 80 year old female admitted 07/27/14 due to increased confusion and weakness. PMH significant for COPD, ESRD, HTN, CHF, GERD, PEA with anoxic encephalopathy. Pt intubated 11/5-7/15. BSE ordered  to evaluate swallow function and safety and identify least restrictive diet. Type of Study: Bedside swallow evaluation Previous Swallow Assessment: none found Diet Prior to this Study: NPO Temperature Spikes Noted: No Respiratory Status: Nasal cannula History of Recent Intubation: Yes Length of Intubations (days): 3 days Date extubated: 07/30/14 Behavior/Cognition: Alert;Cooperative;Pleasant mood Oral Cavity - Dentition: Missing dentition Self-Feeding Abilities: Able to feed self Patient Positioning: Upright in chair Baseline Vocal Quality: Clear Volitional Cough: Strong Volitional Swallow: Able to elicit    Oral/Motor/Sensory Function Overall Oral  Motor/Sensory  Function: Appears within functional limits for tasks assessed   Ice Chips Ice chips: Impaired Presentation: Spoon Pharyngeal Phase Impairments: Cough - Immediate   Thin Liquid Thin Liquid: Impaired Presentation: Cup Pharyngeal  Phase Impairments: Cough - Immediate    Nectar Thick Nectar Thick Liquid: Within functional limits Presentation: Straw   Honey Thick Honey Thick Liquid: Not tested   Puree Puree: Within functional limits Presentation: Self Fed;Spoon   Solid   GO   [name], [facility] EtowahMSP, CCC-SLP [phone]  Solid: Within functional limits Presentation: Self Fed       [name], [name] 07/31/2014,12:24 PM

## 2014-07-31 NOTE — Progress Notes (Signed)
[facility] Progress Note   Subjective: extubated, up in chair, alert, orders for transfer out of ICU  Filed Vitals:   07/31/14 0746 07/31/14 0800 07/31/14 0844 07/31/14 0900  BP:  119/40  119/48  Pulse:  72  77  Temp: 98.7 F (37.1 C)     TempSrc: Oral     Resp:  29  29  Height:      Weight:      SpO2:  97% 100% 99%   Exam: Up in chair, alert, pleasant No jvd Dec'd BS at bases, slight rales at bases RRR no MRG Abd soft, NTND No LE or UE edema R IJ cath in place Neuro alert, nf  HD: MWF [place] 3.5h   69kg   2/2.25 BAth  Heparin 3000  R IJ cath Aranesp 25 ug every 2 weeks, Calcitriol 1.0 ug TIW pth 991, Ca 7.4, P 4.5        Assessment: 1 PNA / acute on chronic resp failure - resolved, d/t underlying COPD (home O2) / LLL PNA / pulm edema w vol overload; on IV abx 2 ESRD on HD 3 HTN/ vol - below dry wt, holding BP meds, BP's low normal 4 Anemia cont darbe q other wk, Hb 9's 5 HPTH cont vit D, no binder on med list 6 COPD on home O2 7 Hx PEA arrest Jul 2013 8 Access - hx failed L RC AVF, hx [facility]   Plan- HD tomorrow, cont to lower dry wt a bit more as tolerated. Investigate perm access situation this week, patient not helpful with history.     Vinson Moselleob Ireland Chagnon MD  pager [phone]    cell [phone]  07/31/2014, 11:18 AM     Recent Labs Lab 07/28/14 0555 07/29/14 0455 07/30/14 0409 07/31/14 0510  NA 140 140 138 143  K 4.6 3.6* 3.7 3.8  CL 99 100 99 103  CO2 27 25 24 28   GLUCOSE 112* 79 84 66*  BUN 20 31* 19 17  CREATININE 6.43* 7.51* 5.11* 4.40*  CALCIUM 7.4* 6.8* 7.0* 7.3*  PHOS 6.5*  --  2.9 3.3    Recent Labs Lab 07/27/14 1948 07/28/14 0555 07/29/14 0455  AST  --   --  19  ALT  --   --  <5  ALKPHOS  --   --  36*  BILITOT  --   --  0.4  PROT  --   --  5.8*  ALBUMIN 2.8* 2.9* 2.4*    Recent Labs Lab 07/29/14 0630 07/30/14 0409 07/31/14 0510  WBC 7.1 7.7 8.9  NEUTROABS  --  5.5 6.9   HGB 9.8* 9.4* 9.2*  HCT 32.3* 30.6* 30.2*  MCV 99.1 96.8 99.7  PLT 167 166 140*   . amiodarone  200 mg Per Tube Daily  . antiseptic oral rinse  7 mL Mouth Rinse q12n4p  . arformoterol  15 mcg Nebulization BID  . aspirin  81 mg Per Tube Daily  . budesonide  0.5 mg Nebulization BID  . [START ON 08/01/2014] ceFEPime (MAXIPIME) IV  2 g Intravenous Q M,W,F-HD  . chlorhexidine  15 mL Mouth Rinse BID  . [START ON 08/10/2014] darbepoetin (ARANESP) injection - DIALYSIS  25 mcg Intravenous Q14 Days  . heparin  5,000 Units Subcutaneous 3 times per day  . ipratropium-albuterol  3 mL Nebulization Q6H  . pantoprazole sodium  40 mg Per Tube Q1200  . sodium chloride  3  mL Intravenous Q12H     sodium chloride, sodium chloride, albuterol, feeding supplement (NEPRO CARB STEADY), heparin, lidocaine (PF), lidocaine-prilocaine, pentafluoroprop-tetrafluoroeth, sodium chloride

## 2014-07-31 NOTE — Progress Notes (Signed)
PULMONARY / CRITICAL CARE MEDICINE   Name: [name] MRN: [id number] DOB: 06/14/1929    ADMISSION DATE:  07/27/2014   CONSULTATION DATE:  07/28/2014  REFERRING MD :  [facility]  INITIAL PRESENTATION:  Acute encephalopathy, acute respiratory failure   75F with history atrial fibrillation, COPD on home O2, ESRD on HD MWF, HTN [facility] 07/27/2014 with acute encephalopathy. She has had worsening weakness and decreased appetite over the past few weeks. She has increasing confusion over the past few days. She was recently started on Remeron by PCP about 3 weeks ago. [facility] she was found to have a UTI. [facility]. Admitted by [facility] and started on empiric antibiotics for UTI. She had acute on chronic respiratory failure and PCCM consulted.   SIGNIFICANT EVENTS: 07/27/2014 - [facility]  Acute respiratory failure requiring intubation, LIJ CVC placed 07/29/14: Frail. On levophed low dose and precedex  11/7 extubated  SUBJECTIVE/OVERNIGHT/INTERVAL HX 07/30/14: off pressors and precedex. Doing well on SBt; meets extubation criteria  HISTORY OF PRESENT ILLNESS: VITAL SIGNS: Temp:  [98.2 F (36.8 C)-98.9 F (37.2 C)] 98.7 F (37.1 C) (11/08 0746) Pulse Rate:  [68-89] 72 (11/08 0800) Resp:  [12-34] 29 (11/08 0800) BP: (85-127)/(36-110) 119/40 mmHg (11/08 0800) SpO2:  [91 %-100 %] 97 % (11/08 0800) Arterial Line BP: (73-127)/(35-86) 81/39 mmHg (11/07 1600) FiO2 (%):  [40 %] 40 % (11/07 1200) Weight:  [147 lb 11.3 oz (67 kg)-151 lb 10.8 oz (68.8 kg)] 147 lb 11.3 oz (67 kg) (11/07 1528) HEMODYNAMICS:   VENTILATOR SETTINGS: Vent Mode:  [-] PSV;CPAP FiO2 (%):  [40 %] 40 % PEEP:  [5 cmH20] 5 cmH20 Pressure Support:  [5 cmH20] 5 cmH20 INTAKE / OUTPUT:  Intake/Output Summary (Last 24 hours) at 07/31/14 0838 Last data filed at 07/31/14 0600  Gross per 24 hour  Intake    388 ml  Output   1800 ml   Net  -1412 ml    PHYSICAL EXAMINATION: General:  Frail , awake , no distress Neuro:  RASS 1. CAM-IC neg for deliirium. Moves all 4s.Speech clear HEENT: No JVD Cardiovascular:  RRR, no m/r/g  Lungs:  Very distant BS, prolonged exp, On O2 Abdomen:  Soft, NT, ND, +bs Ext:  Warm and dry, no edema  LABS:  PULMONARY  Recent Labs Lab 07/28/14 0807 07/28/14 1612 07/29/14 1247 07/30/14 0349  PHART 7.047* 7.248* 7.393 7.444  PCO2ART 113.0* 68.2* 45.8* 37.6  PO2ART 56.1* 238.0* 88.0 89.0  HCO3 29.5* 29.7* 28.0* 25.4*  TCO2 33.0 32 29 26.5  O2SAT 82.6 100.0 97.0 97.4    CBC  Recent Labs Lab 07/29/14 0630 07/30/14 0409 07/31/14 0510  HGB 9.8* 9.4* 9.2*  HCT 32.3* 30.6* 30.2*  WBC 7.1 7.7 8.9  PLT 167 166 140*    COAGULATION No results for input(s): INR in the last 168 hours.  CARDIAC    Recent Labs Lab 07/29/14 0455  TROPONINI <0.30   No results for input(s): PROBNP in the last 168 hours.   CHEMISTRY  Recent Labs Lab 07/27/14 1948 07/28/14 0555 07/29/14 0455 07/30/14 0409 07/31/14 0510  NA 138 140 140 138 143  K 4.1 4.6 3.6* 3.7 3.8  CL 95* 99 100 99 103  CO2 30 27 25 24 28   GLUCOSE 127* 112* 79 84 66*  BUN 32* 20 31* 19 17  CREATININE 7.96* 6.43* 7.51* 5.11* 4.40*  CALCIUM 7.5* 7.4* 6.8* 7.0* 7.3*  MG  --   --   --  1.9 1.9  PHOS 6.9* 6.5*  --  2.9 3.3   Estimated Creatinine Clearance: 8.7 mL/min (by C-G formula based on Cr of 4.4).   LIVER  Recent Labs Lab 07/27/14 1948 07/28/14 0555 07/29/14 0455  AST  --   --  19  ALT  --   --  <5  ALKPHOS  --   --  36*  BILITOT  --   --  0.4  PROT  --   --  5.8*  ALBUMIN 2.8* 2.9* 2.4*     INFECTIOUS  Recent Labs Lab 07/29/14 1332 07/30/14 0409 07/31/14 0510  PROCALCITON 0.63 1.20 1.38     ENDOCRINE CBG (last 3)   Recent Labs  07/29/14 2012 07/29/14 2312 07/30/14 0438  GLUCAP 98 87 86         IMAGING x48h Dg Chest Port 1 View  07/30/2014   CLINICAL DATA:   80 year old female currently admitted with acute respiratory failure. Subsequent encounter.  EXAM: PORTABLE CHEST - 1 VIEW  COMPARISON:  Multiple recent prior chest x-rays. Most recent prior chest x-ray 07/29/2014  FINDINGS: The tip of the endotracheal tube is 2.2 cm above the carina. The proximal side hole of the nasogastric tube overlies the gastric fundus. A left IJ approach central venous catheter has advanced. The tip now overlies the mid SVC in good position. Right IJ approach tunneled hemodialysis catheter. Catheter tip at the superior cavoatrial junction in good position.  Stable enlargement of the cardiopericardial silhouette. Probable left pleural effusion and associated atelectasis. Slight interval improvement in pulmonary vascular congestion. Inspiratory volumes remain low. No pneumothorax.  IMPRESSION: 1. The left IJ central venous catheter has advanced so that the tip now overlies the mid SVC. Other support apparatus remain in unchanged position. 2. Resolved pulmonary vascular congestion. 3. Persistent globular enlargement of the cardiopericardial silhouette which may reflect cardiomegaly and/or pericardial effusion. 4. Persistent low inspiratory volumes with left greater than right basilar atelectasis. 5. Suspect layering left pleural effusion.   Electronically Signed   By: Malachy Moan M.D.   On: 07/30/2014 07:39        ASSESSMENT / PLAN:  PULMONARY OETT 11/05>>>11/7 A: Acute on chronic hypercarbic resp failure - r/t COPD and  Likely LL PNA baed on cXR.   - 07/30/14: extubated  P:   O2 as needed   CARDIOVASCULAR CVL L IJ 11/05>>> A:  Hx CHF Hx A fib   -  Low diastolic +. Off pressors  P:  Systolic range  > 95 and MAP > 50 on account of ESRD and low diastolic Cont amiodarone 200mg  daily ASA 81mg  daily Home Plavix held, may restart soon  RENAL A:   ESRD via R tunneled HD cath P:   Dialysis per nephrology  GASTROINTESTINAL A:   No acute issues P:   SUP:  enteral PPI DC tube feeds   HEMATOLOGIC A:   Mild Anemia without overt blood loss P:  DVT px: SQ heparin Monitor CBC intermittently Transfuse per usual ICU guidelines  INFECTIOUS BCx2 11/04 >> UC 11/04 >>neg Sputum 11/05 >>neg  A:   CXR 07/29/14 suggests LLL penumonia  P:   Rx dc vanc  Continue cefepime 07/29/14>>  ENDOCRINE A:   No acute issues P:   Monitor glucose  NEUROLOGIC A:   Normal mental status.  P:   Rass 1   FAMILY  - Updates: None present at bedside  - Inter-disciplinary family meet or Palliative Care meeting due by:  11/12 if still in  ICU    TODAY'S SUMMARY: 45F with history atrial fibrillation, CHF, COPD on home O2, ESRD on HD MWF, HTN [facility] 07/27/2014 with acute encephalopathy now with acute on chronic respiratory failure due to LLL PNA.Exdtubated 11/7.Still at high risk for decopensation next 24h. 11/8 tolerated extubated and stable on [place] O2. Consider tx to sdu/floor and to [facility]   Brett CanalesSteve Neftaly Swiss ACNP [facility] PCCM Pager [phone] till 3 pm If no answer page [phone] 07/31/2014, 8:39 AM

## 2014-08-01 ENCOUNTER — Inpatient Hospital Stay (HOSPITAL_COMMUNITY): Payer: Medicare Other

## 2014-08-01 LAB — CBC WITH DIFFERENTIAL/PLATELET
Basophils Absolute: 0 10*3/uL (ref 0.0–0.1)
Basophils Relative: 0 % (ref 0–1)
Eosinophils Absolute: 0.3 10*3/uL (ref 0.0–0.7)
Eosinophils Relative: 3 % (ref 0–5)
HCT: 31.3 % — ABNORMAL LOW (ref 36.0–46.0)
Hemoglobin: 9.3 g/dL — ABNORMAL LOW (ref 12.0–15.0)
Lymphocytes Relative: 20 % (ref 12–46)
Lymphs Abs: 1.7 10*3/uL (ref 0.7–4.0)
MCH: 30.7 pg (ref 26.0–34.0)
MCHC: 29.7 g/dL — ABNORMAL LOW (ref 30.0–36.0)
MCV: 103.3 fL — ABNORMAL HIGH (ref 78.0–100.0)
Monocytes Absolute: 0.9 10*3/uL (ref 0.1–1.0)
Monocytes Relative: 10 % (ref 3–12)
Neutro Abs: 5.7 10*3/uL (ref 1.7–7.7)
Neutrophils Relative %: 67 % (ref 43–77)
Platelets: 164 10*3/uL (ref 150–400)
RBC: 3.03 MIL/uL — ABNORMAL LOW (ref 3.87–5.11)
RDW: 14.7 % (ref 11.5–15.5)
WBC: 8.6 10*3/uL (ref 4.0–10.5)

## 2014-08-01 LAB — BASIC METABOLIC PANEL WITH GFR
Anion gap: 12 (ref 5–15)
BUN: 29 mg/dL — ABNORMAL HIGH (ref 6–23)
CO2: 28 meq/L (ref 19–32)
Calcium: 7.5 mg/dL — ABNORMAL LOW (ref 8.4–10.5)
Chloride: 103 meq/L (ref 96–112)
Creatinine, Ser: 6.26 mg/dL — ABNORMAL HIGH (ref 0.50–1.10)
GFR calc Af Amer: 6 mL/min — ABNORMAL LOW
GFR calc non Af Amer: 6 mL/min — ABNORMAL LOW
Glucose, Bld: 91 mg/dL (ref 70–99)
Potassium: 4 meq/L (ref 3.7–5.3)
Sodium: 143 meq/L (ref 137–147)

## 2014-08-01 LAB — PHOSPHORUS: Phosphorus: 3.9 mg/dL (ref 2.3–4.6)

## 2014-08-01 LAB — MAGNESIUM: Magnesium: 2.1 mg/dL (ref 1.5–2.5)

## 2014-08-01 MED ORDER — ALLOPURINOL 100 MG PO TABS
100.0000 mg | ORAL_TABLET | Freq: Every day | ORAL | Status: DC
  Administered 2014-08-01 – 2014-08-05 (×5): 100 mg via ORAL
  Filled 2014-08-01 (×5): qty 1

## 2014-08-01 MED ORDER — NEPRO/CARBSTEADY PO LIQD: 237.0000 mL | ORAL | Status: DC | PRN

## 2014-08-01 MED ORDER — SODIUM CHLORIDE 0.9 % IV SOLN: 100.0000 mL | INTRAVENOUS | Status: DC | PRN

## 2014-08-01 MED ORDER — LIDOCAINE-PRILOCAINE 2.5-2.5 % EX CREA: 1.0000 "application " | TOPICAL_CREAM | CUTANEOUS | Status: DC | PRN

## 2014-08-01 MED ORDER — HEPARIN SODIUM (PORCINE) 1000 UNIT/ML DIALYSIS
3000.0000 [IU] | Freq: Once | INTRAMUSCULAR | Status: AC
  Administered 2014-08-05: 3000 [IU] via INTRAVENOUS_CENTRAL
  Filled 2014-08-01: qty 3

## 2014-08-01 MED ORDER — LIDOCAINE HCL (PF) 1 % IJ SOLN: 5.0000 mL | INTRAMUSCULAR | Status: DC | PRN

## 2014-08-01 MED ORDER — STARCH (THICKENING) PO POWD
ORAL | Status: DC | PRN
  Filled 2014-08-01: qty 227

## 2014-08-01 MED ORDER — AMIODARONE HCL 200 MG PO TABS
200.0000 mg | ORAL_TABLET | Freq: Every day | ORAL | Status: DC
  Administered 2014-08-02 – 2014-08-05 (×4): 200 mg via ORAL
  Filled 2014-08-01 (×4): qty 1

## 2014-08-01 MED ORDER — HEPARIN SODIUM (PORCINE) 1000 UNIT/ML DIALYSIS: 1000.0000 [IU] | INTRAMUSCULAR | Status: DC | PRN

## 2014-08-01 MED ORDER — ALTEPLASE 2 MG IJ SOLR: 2.0000 mg | Freq: Once | INTRAMUSCULAR | Status: DC | PRN

## 2014-08-01 MED ORDER — HEPARIN SODIUM (PORCINE) 1000 UNIT/ML DIALYSIS: 3000.0000 [IU] | Freq: Once | INTRAMUSCULAR | Status: DC

## 2014-08-01 MED ORDER — HEPARIN SODIUM (PORCINE) 1000 UNIT/ML DIALYSIS
1000.0000 [IU] | INTRAMUSCULAR | Status: DC | PRN
  Filled 2014-08-01: qty 1

## 2014-08-01 MED ORDER — ASPIRIN 81 MG PO CHEW
81.0000 mg | CHEWABLE_TABLET | Freq: Every day | ORAL | Status: DC
  Administered 2014-08-02 – 2014-08-05 (×3): 81 mg via ORAL
  Filled 2014-08-01 (×3): qty 1

## 2014-08-01 MED ORDER — COLCHICINE 0.6 MG PO TABS
0.6000 mg | ORAL_TABLET | Freq: Every day | ORAL | Status: DC
  Administered 2014-08-01: 0.6 mg via ORAL
  Filled 2014-08-01 (×2): qty 1

## 2014-08-01 MED ORDER — PENTAFLUOROPROP-TETRAFLUOROETH EX AERO: 1.0000 "application " | INHALATION_SPRAY | CUTANEOUS | Status: DC | PRN

## 2014-08-01 MED ORDER — RENA-VITE PO TABS
1.0000 | ORAL_TABLET | Freq: Every day | ORAL | Status: DC
  Administered 2014-08-01 – 2014-08-04 (×4): 1 via ORAL
  Filled 2014-08-01 (×5): qty 1

## 2014-08-01 MED ORDER — CETYLPYRIDINIUM CHLORIDE 0.05 % MT LIQD
7.0000 mL | Freq: Two times a day (BID) | OROMUCOSAL | Status: DC
  Administered 2014-08-01 – 2014-08-03 (×3): 7 mL via OROMUCOSAL

## 2014-08-01 MED ORDER — ALTEPLASE 2 MG IJ SOLR
2.0000 mg | Freq: Once | INTRAMUSCULAR | Status: AC | PRN
  Filled 2014-08-01: qty 2

## 2014-08-01 NOTE — Progress Notes (Signed)
[place] TEAM 1 - Stepdown/ICU TEAM Progress Note  [name] ZOX:[id number] DOB: 1928/12/21 DOA: 07/27/2014 PCP: [name], MD  Admit HPI / Brief Narrative: 78F with history atrial fibrillation, COPD on home O2, ESRD on HD MWF, and HTN transferred from [facility] 07/27/2014 with acute encephalopathy. She has had worsening weakness and decreased appetite over the past few weeks. She has increasing confusion over the past few days. She was recently started on Remeron by PCP about 3 weeks prior. At [facility] she was found to have a UTI. She was transferred to [facility]. Admitted by [facility] and started on empiric antibiotics for UTI. She had acute on chronic respiratory failure and PCCM consulted.  Since admission the pt has been diagnosed w/ LLL PNA.  Intubated 07/28/14 through 07/30/14 for LLL pneumonia. Needed pressors but off since 07/29/14. Extubated 07/30/14.   Significant Events: 11/4  admit to [facility] 11/05 acute respiratory failure requiring intubation, LIJ CVC placed 11/06 on levophed low dose and precedex  11/7 extubated  HPI/Subjective: Pt is awake and conversant, though somewhat confused.  She denies specific complaints.  She states she is hungry.  She denies cp, n/v, abdom pain, or sob.    Assessment/Plan:  Acute on chronic hypercarbic resp failure - due to COPD andLL PNA No evidence of COPD exacerbation at this time - to complete 7 days of abx tx for LLL PNA   UTI? Urine culture unrevealing - UA findings of ?reliability in this pt who is ESRD - will be on empiric abx nonetheless for her LLL PNA   Toxic metabolic encephalopathy Slowly improving w/ tx of above - transfer out of ICU - follow mental status - begin PT/OT   TTE abnormalities  TTE report noted ?mobile mass of the intraatrial septum, as well as a possible aneursym of the ascending aorta - I will obtain CTA of chest to further evaluate - pt has a known AAA for which she if  followed by [facility] (last seen Jan 2015)  COPD on home O2 Appears well compensated at present - resume home meds  ESRD on HD MWF via R tunneled HD cath Nephrology following   Chronic CHF Clinically well compensated at present - volume control per HD   Chronic A fib On amio - in NSR at this time   Hx PEA arrest Jul 2013  Mild Anemia without overt blood loss Due to CKD - follow trend   Code Status: FULL Family Communication: no family present at time of exam Disposition Plan: transfer to medical bed   Consultants: Nephrology  Procedures: 11/7 - TTE - normal systolic fxn - EF 60-65% - no WMA - grade 1 DD - highly echogenic structure in the sinotubular junction measuring 7 x 5 mm and aneurysmal appearing ascending thoracic aorta. There is also moderate aortic regurgitation. Aortic dissection can't be excluded and further evaluation with chest CTA is recommended.  There appears to be a mobile mass attached to the interatrial septum measuring 13 x 17 mm. This is possibly due to hypermobile interatrial septum, but further evaluation with CT is recommended.  Antibiotics: Cefepime 11/4 > Vanc 11/4 >  DVT prophylaxis: SQ heparin   Objective: Blood pressure 114/40, pulse 72, temperature 97.9 F (36.6 C), temperature source Oral, resp. rate 24, height 5\' 5"  (1.651 m), weight 67 kg (147 lb 11.3 oz), SpO2 99 %.  Intake/Output Summary (Last 24 hours) at 08/01/14 0934 Last data filed at 07/31/14 1900  Gross per 24 hour  Intake    100 ml  Output      0 ml  Net    100 ml   Exam: General: No acute respiratory distress in bed  Lungs: mild bibasilar crackles L > R - no wheeze  Cardiovascular: Regular rate and rhythm without murmur gallop or rub  Abdomen: Nontender, nondistended, soft, bowel sounds positive, no rebound, no ascites, no appreciable mass Extremities: No significant cyanosis, clubbing, or edema bilateral lower extremities  Data Reviewed: Basic Metabolic  Panel:  Recent Labs Lab 07/27/14 1948 07/28/14 0555 07/29/14 0455 07/30/14 0409 07/31/14 0510 08/01/14 0200  NA 138 140 140 138 143 143  K 4.1 4.6 3.6* 3.7 3.8 4.0  CL 95* 99 100 99 103 103  CO2 30 27 25 24 28 28   GLUCOSE 127* 112* 79 84 66* 91  BUN 32* 20 31* 19 17 29*  CREATININE 7.96* 6.43* 7.51* 5.11* 4.40* 6.26*  CALCIUM 7.5* 7.4* 6.8* 7.0* 7.3* 7.5*  MG  --   --   --  1.9 1.9 2.1  PHOS 6.9* 6.5*  --  2.9 3.3 3.9    Liver Function Tests:  Recent Labs Lab 07/27/14 1948 07/28/14 0555 07/29/14 0455  AST  --   --  19  ALT  --   --  <5  ALKPHOS  --   --  36*  BILITOT  --   --  0.4  PROT  --   --  5.8*  ALBUMIN 2.8* 2.9* 2.4*   CBC:  Recent Labs Lab 07/28/14 0535 07/29/14 0630 07/30/14 0409 07/31/14 0510 08/01/14 0200  WBC 6.7 7.1 7.7 8.9 8.6  NEUTROABS  --   --  5.5 6.9 5.7  HGB 10.2* 9.8* 9.4* 9.2* 9.3*  HCT 36.5 32.3* 30.6* 30.2* 31.3*  MCV 108.6* 99.1 96.8 99.7 103.3*  PLT 163 167 166 140* 164    Cardiac Enzymes:  Recent Labs Lab 07/29/14 0455  TROPONINI <0.30   CBG:  Recent Labs Lab 07/29/14 1139 07/29/14 1628 07/29/14 2012 07/29/14 2312 07/30/14 0438  GLUCAP 103* 106* 98 87 86    Recent Results (from the past 240 hour(s))  Culture, Urine     Status: None   Collection Time: 07/27/14  6:23 PM  Result Value Ref Range Status   Specimen Description URINE, RANDOM  Final   Special Requests NONE  Final   Culture  Setup Time   Final    07/27/2014 22:15 Performed at [facility]    Colony Count NO GROWTH Performed at [facility]   Final   Culture NO GROWTH Performed at [facility]   Final   Report Status 07/28/2014 FINAL  Final  Culture, blood (routine x 2)     Status: None (Preliminary result)   Collection Time: 07/27/14  6:50 PM  Result Value Ref Range Status   Specimen Description BLOOD RIGHT ARM  Final   Special Requests BOTTLES DRAWN AEROBIC ONLY 5CC  Final   Culture  Setup Time   Final     07/27/2014 22:20 Performed at [facility]    Culture   Final           BLOOD CULTURE RECEIVED NO GROWTH TO DATE CULTURE WILL BE HELD FOR 5 DAYS BEFORE ISSUING A FINAL NEGATIVE REPORT Performed at [facility]    Report Status PENDING  Incomplete  MRSA PCR Screening     Status: None   Collection Time: 07/27/14  6:51 PM  Result Value Ref  Range Status   MRSA by PCR NEGATIVE NEGATIVE Final    Comment:        The GeneXpert MRSA Assay (FDA approved for NASAL specimens only), is one component of a comprehensive MRSA colonization surveillance program. It is not intended to diagnose MRSA infection nor to guide or monitor treatment for MRSA infections.   Culture, blood (routine x 2)     Status: None (Preliminary result)   Collection Time: 07/27/14  6:55 PM  Result Value Ref Range Status   Specimen Description BLOOD RIGHT ARM  Final   Special Requests BOTTLES DRAWN AEROBIC ONLY 5CC  Final   Culture  Setup Time   Final    07/27/2014 22:23 Performed at [facility]    Culture   Final           BLOOD CULTURE RECEIVED NO GROWTH TO DATE CULTURE WILL BE HELD FOR 5 DAYS BEFORE ISSUING A FINAL NEGATIVE REPORT Performed at [facility]    Report Status PENDING  Incomplete  Culture, respiratory (NON-Expectorated)     Status: None   Collection Time: 07/28/14 10:25 AM  Result Value Ref Range Status   Specimen Description TRACHEAL ASPIRATE  Final   Special Requests NONE  Final   Gram Stain   Final    RARE WBC PRESENT,BOTH PMN AND MONONUCLEAR RARE SQUAMOUS EPITHELIAL CELLS PRESENT RARE GRAM POSITIVE COCCI IN PAIRS IN CLUSTERS RARE YEAST Performed at [facility]    Culture   Final    Non-Pathogenic Oropharyngeal-type Flora Isolated. Performed at [facility]    Report Status 07/30/2014 FINAL  Final     Studies:  Recent x-ray studies have been reviewed in detail by the Attending Physician  Scheduled Meds:  Scheduled Meds: .  amiodarone  200 mg Per Tube Daily  . antiseptic oral rinse  7 mL Mouth Rinse q12n4p  . arformoterol  15 mcg Nebulization BID  . aspirin  81 mg Per Tube Daily  . budesonide  0.5 mg Nebulization BID  . calcitRIOL  1 mcg Oral Q M,W,F-HD  . ceFEPime (MAXIPIME) IV  2 g Intravenous Q M,W,F-HD  . chlorhexidine  15 mL Mouth Rinse BID  . [START ON 08/10/2014] darbepoetin (ARANESP) injection - DIALYSIS  25 mcg Intravenous Q14 Days  . heparin  5,000 Units Subcutaneous 3 times per day  . ipratropium-albuterol  3 mL Nebulization Q6H  . multivitamin  1 tablet Oral QHS  . pantoprazole  40 mg Oral Daily  . sodium chloride  3 mL Intravenous Q12H    Time spent on care of this patient: 35 mins   Donterrius Santucci T , MD   [facility] Office  [phone] Pager - Text Page per Loretha StaplerAmion as per below:  On-Call/Text Page:      [email or web address]      password [email or web address]  If 7PM-7AM, please contact night-coverage [email or web address] Password [email or web address] 08/01/2014, 9:34 AM   LOS: 5 days

## 2014-08-01 NOTE — Progress Notes (Signed)
Attempted to call report x2. 6 [place] engineerN and Charge RN unable to take report at this time.

## 2014-08-01 NOTE — Progress Notes (Signed)
Pt transported to 6 East room 8 via a wheelchair. All belongings sent with Pt. Report called to 6 Rebeccasideast RN, [place]. Elink and Pt's granddaughter notified.

## 2014-08-01 NOTE — Care Management Note (Signed)
    Page 1 of 1   08/01/2014     9:56:10 AM CARE MANAGEMENT NOTE 08/01/2014  Patient:  [facility]   Account Number:  [id number]  Date Initiated:  07/29/2014  Documentation initiated by:  Avie ArenasBROWN,Lashay Osborne  Subjective/Objective Assessment:   Admitted with AMS and UTI - progressed to resp failure and was intubated.     Action/Plan:   Anticipated DC Date:  08/05/2014   Anticipated DC Plan:  SKILLED NURSING FACILITY      DC Planning Services  CM consult      Choice offered to / List presented to:             Status of service:  In process, will continue to follow Medicare Important Message given?  YES (If response is "NO", the following Medicare IM given date fields will be blank) Date Medicare IM given:  08/01/2014 Medicare IM given by:  [facility]Lowell Makara Date Additional Medicare IM given:   Additional Medicare IM given by:    Discharge Disposition:    Per UR Regulation:  Reviewed for med. necessity/level of care/duration of stay  If discussed at Long Length of Stay Meetings, dates discussed:    Comments:  Contact:  Flower,[name] Daughter [phone]     Lyndal PulleyHill,Debra Grandaughter [phone]

## 2014-08-01 NOTE — Progress Notes (Signed)
Speech Language Pathology Treatment: Dysphagia  Patient Details Name: [name] MRN: [id number] DOB: 10/30/1928 Today's Date: 08/01/2014 Time: 1050-1107 SLP Time Calculation (min): 17 min  Assessment / Plan / Recommendation Clinical Impression  Treatment focused on diagnostic po trials for potential to advance diet. Patient alert and pleasant. Able to consume all pos provided with full appearing airway protection. No overt s/s of aspiration observed however continued to provide min verbal cueing for use of small bites/sips as aspiration risk remains moderate given recent intubation with now subsiding signs at bedside. Patient appears safe for diet advancement with intermittent supervision for use of safe swallowing strategies. SLP will continue to f/u briefly to monitor for tolerance and reinforce use of swallowing precautions as needed.    HPI HPI: 80 year old female admitted 07/27/14 due to increased confusion and weakness. PMH significant for COPD, ESRD, HTN, CHF, GERD, PEA with anoxic encephalopathy. Pt intubated 11/5-7/15. BSE ordered  to evaluate swallow function and safety and identify least restrictive diet.   Pertinent Vitals Pain Assessment: No/denies pain  SLP Plan  Continue with current plan of care    Recommendations Diet recommendations: Regular;Thin liquid Liquids provided via: Cup;Straw Medication Administration: Whole meds with liquid Supervision: Intermittent supervision to cue for compensatory strategies;Staff to assist with self feeding Compensations: Slow rate;Small sips/bites Postural Changes and/or Swallow Maneuvers: Seated upright 90 degrees              Oral Care Recommendations: Oral care BID Follow up Recommendations: None Plan: Continue with current plan of care    GO   Ferdinand LangoLeah Collie Wernick MA, CCC-SLP [phone]   [name] 08/01/2014, 11:13 AM

## 2014-08-01 NOTE — Progress Notes (Signed)
S: Breathing better.  Eating OK O:BP 116/43 mmHg  Pulse 81  Temp(Src) 98.4 F (36.9 C) (Oral)  Resp 15  Ht 5\' 5"  (1.651 m)  Wt 67 kg (147 lb 11.3 oz)  BMI 24.58 kg/m2  SpO2 100%  Intake/Output Summary (Last 24 hours) at 08/01/14 0742 Last data filed at 07/31/14 1900  Gross per 24 hour  Intake    120 ml  Output      0 ml  Net    120 ml   Weight change:  BJY:NWGNFGen:awake and alert CVS:RRR Resp: decreased BS throughout Abd:+ BS NTND Ext: no edema NEURO: CNI Ox2 (not year) no asterixis Rt IJ Permcath.  Lt IJ triple lumen   . amiodarone  200 mg Per Tube Daily  . antiseptic oral rinse  7 mL Mouth Rinse q12n4p  . arformoterol  15 mcg Nebulization BID  . aspirin  81 mg Per Tube Daily  . budesonide  0.5 mg Nebulization BID  . calcitRIOL  1 mcg Oral Q M,W,F-HD  . ceFEPime (MAXIPIME) IV  2 g Intravenous Q M,W,F-HD  . chlorhexidine  15 mL Mouth Rinse BID  . [START ON 08/10/2014] darbepoetin (ARANESP) injection - DIALYSIS  25 mcg Intravenous Q14 Days  . heparin  5,000 Units Subcutaneous 3 times per day  . ipratropium-albuterol  3 mL Nebulization Q6H  . pantoprazole  40 mg Oral Daily  . sodium chloride  3 mL Intravenous Q12H   Dg Chest Port 1 View  08/01/2014   CLINICAL DATA:  Respiratory failure.  EXAM: PORTABLE CHEST - 1 VIEW  COMPARISON:  07/30/2014  FINDINGS: The endotracheal tube and nasogastric tube have been removed. Left IJ central line tip overlies the level of the superior vena cava. Right-sided dialysis catheter tip overlies the level of the upper right atrium.  The heart is enlarged. There is opacity at the lung bases, similar in appearance on the left and slightly increased on the right. Bilateral pleural effusions are suspected.  IMPRESSION: 1. Interval removal of nasogastric tube and endotracheal tube. 2. Increased atelectasis at the right lung base.   Electronically Signed   By: Rosalie GumsBeth  Brown M.D.   On: 08/01/2014 07:13   BMET    Component Value Date/Time   NA 143 08/01/2014  0200   K 4.0 08/01/2014 0200   CL 103 08/01/2014 0200   CO2 28 08/01/2014 0200   GLUCOSE 91 08/01/2014 0200   BUN 29* 08/01/2014 0200   CREATININE 6.26* 08/01/2014 0200   CREATININE 1.69* 09/26/2011 1129   CALCIUM 7.5* 08/01/2014 0200   GFRNONAA 6* 08/01/2014 0200   GFRAA 6* 08/01/2014 0200   CBC    Component Value Date/Time   WBC 8.6 08/01/2014 0200   RBC 3.03* 08/01/2014 0200   HGB 9.3* 08/01/2014 0200   HCT 31.3* 08/01/2014 0200   PLT 164 08/01/2014 0200   MCV 103.3* 08/01/2014 0200   MCH 30.7 08/01/2014 0200   MCHC 29.7* 08/01/2014 0200   RDW 14.7 08/01/2014 0200   LYMPHSABS 1.7 08/01/2014 0200   MONOABS 0.9 08/01/2014 0200   EOSABS 0.3 08/01/2014 0200   BASOSABS 0.0 08/01/2014 0200     Assessment: 1. Resp failure sec PNA, pulm edema, COPD 2. Anemia on aranesp 3. Sec HPTH on calcitriol 4. COPD on home O2 Plan: 1. HD today 2. Could transfer out of unit 3. Get her up out of bed 4. Resume renavite   Lum Stillinger T

## 2014-08-01 NOTE — Progress Notes (Signed)
Physical Therapy Treatment Patient Details Name: [name] PieriniRuth Pflug MRN: [id number] DOB: 1929-02-27 Today's Date: 08/01/2014    History of Present Illness 66F with history atrial fibrillation, COPD on home O2, ESRD on HD MWF, HTN [facility]/27/2014 with acute encephalopathy, weakness, confusion, UTI.[facility]. VDRF 11/5-11/7.    PT Comments    Pt with limited activity tolerance by orthostatic hypotension. Pt educated for bil LE HEP and encouraged to perform as able as well as to attempt mobility later after HD with RN assist but not alone. Will continue to follow to maximize mobility, safety and balance as cardiopulmonary status allow. Pt continues to have difficulty processing cues and recalling events. Unable to achieve BP with all positions and pt ambulated to bathroom and back due to lack of BSC. RN aware of all mobility with pt returned to bed end of session.   BP sitting EOB 111/46 (61)  Sitting in chair after gait 98/34 (57)  Return to supine 98/40 (54)     Pt with HR 75 throughout and unable to get BP in standing or during gait      Follow Up Recommendations  SNF;Supervision/Assistance - 24 hour     Equipment Recommendations       Recommendations for Other Services       Precautions / Restrictions Precautions Precautions: Fall Precaution Comments: orthostatic hypotension    Mobility  Bed Mobility Overal bed mobility: Needs Assistance Bed Mobility: Supine to Sit     Supine to sit: Min assist     General bed mobility comments: cues for sequence and assist with handheld assist to elevate trunk from surface and min assist to scoot. With return to bed minguard with mod assist to scoot to [facility]  Transfers Overall transfer level: Needs assistance Equipment used: 1 person hand held assist Transfers: Sit to/from [employer];Stand Pivot Transfers Sit to Stand: Min assist Stand pivot transfers: Min assist       General  transfer comment: cues for hand placement and sequence with assist for balance and elevation from surface at chair, bed and commode  Ambulation/Gait Ambulation/Gait assistance: Min assist Ambulation Distance (Feet): 15 Feet (x 2 trials with seated rest) Assistive device: 1 person hand held assist Gait Pattern/deviations: Step-through pattern;Decreased stride length;Drifts right/left   Gait velocity interpretation: Below normal speed for age/gender General Gait Details: assist at hand and pelvis to control gait and maintain balance   Stairs            Wheelchair Mobility    Modified Rankin (Stroke Patients Only)       Balance     Sitting balance-Leahy Scale: Fair       Standing balance-Leahy Scale: Poor                      Cognition Arousal/Alertness: Awake/alert Behavior During Therapy: Flat affect Overall Cognitive Status: Impaired/Different from baseline Area of Impairment: Memory;Problem solving     Memory: Decreased short-term memory       Problem Solving: Slow processing;Difficulty sequencing;Requires verbal cues      Exercises General Exercises - Lower Extremity Long Arc Quad: AROM;Both;15 reps;Seated    General Comments        Pertinent Vitals/Pain Pain Assessment: No/denies pain    Home Living                      Prior Function  PT Goals (current goals can now be found in the care plan section) Progress towards PT goals: Progressing toward goals    Frequency  Min 3X/week    PT Plan Discharge plan needs to be updated    Co-evaluation             End of Session Equipment Utilized During Treatment: Gait belt;Oxygen Activity Tolerance: Treatment limited secondary to medical complications (Comment) (orthostatic hypotension) Patient left: in bed;with call bell/phone within reach     Time: 1158-1226 PT Time Calculation (min): 28 min  Charges:  $Gait Training: 8-22 mins $Therapeutic Activity:  8-22 mins                    G Codes:      Delorse Lekabor, Nixie Laube Beth 08/01/2014, 1:20 PM Delaney MeigsMaija Tabor Camille Thau, PT [phone]

## 2014-08-01 NOTE — Progress Notes (Signed)
UR Completed.  [name] [name] [phone] 10/11/2013  

## 2014-08-02 ENCOUNTER — Inpatient Hospital Stay (HOSPITAL_COMMUNITY): Payer: Medicare Other

## 2014-08-02 DIAGNOSIS — R931 Abnormal findings on diagnostic imaging of heart and coronary circulation: Secondary | ICD-10-CM | POA: Diagnosis present

## 2014-08-02 DIAGNOSIS — I10 Essential (primary) hypertension: Secondary | ICD-10-CM

## 2014-08-02 DIAGNOSIS — I4891 Unspecified atrial fibrillation: Secondary | ICD-10-CM

## 2014-08-02 LAB — CULTURE, BLOOD (ROUTINE X 2)
CULTURE: NO GROWTH
Culture: NO GROWTH

## 2014-08-02 LAB — COMPREHENSIVE METABOLIC PANEL
ALK PHOS: 41 U/L (ref 39–117)
AST: 17 U/L (ref 0–37)
Albumin: 2.6 g/dL — ABNORMAL LOW (ref 3.5–5.2)
Anion gap: 13 (ref 5–15)
BUN: 16 mg/dL (ref 6–23)
CO2: 27 meq/L (ref 19–32)
Calcium: 7.5 mg/dL — ABNORMAL LOW (ref 8.4–10.5)
Chloride: 101 mEq/L (ref 96–112)
Creatinine, Ser: 4.89 mg/dL — ABNORMAL HIGH (ref 0.50–1.10)
GFR calc Af Amer: 9 mL/min — ABNORMAL LOW (ref >90)
GFR, EST NON AFRICAN AMERICAN: 7 mL/min — AB (ref >90)
Glucose, Bld: 86 mg/dL (ref 70–99)
POTASSIUM: 3.5 meq/L — AB (ref 3.7–5.3)
SODIUM: 141 meq/L (ref 137–147)
Total Bilirubin: 0.4 mg/dL (ref 0.3–1.2)
Total Protein: 6.5 g/dL (ref 6.0–8.3)

## 2014-08-02 LAB — CBC
HCT: 30.6 % — ABNORMAL LOW (ref 36.0–46.0)
Hemoglobin: 9.1 g/dL — ABNORMAL LOW (ref 12.0–15.0)
MCH: 29.7 pg (ref 26.0–34.0)
MCHC: 29.7 g/dL — ABNORMAL LOW (ref 30.0–36.0)
MCV: 100 fL (ref 78.0–100.0)
Platelets: 136 10*3/uL — ABNORMAL LOW (ref 150–400)
RBC: 3.06 MIL/uL — ABNORMAL LOW (ref 3.87–5.11)
RDW: 14.9 % (ref 11.5–15.5)
WBC: 7 10*3/uL (ref 4.0–10.5)

## 2014-08-02 MED ORDER — METOPROLOL TARTRATE 1 MG/ML IV SOLN
INTRAVENOUS | Status: AC
  Administered 2014-08-02: 5 mg via INTRAVENOUS
  Filled 2014-08-02: qty 5

## 2014-08-02 MED ORDER — SODIUM CHLORIDE 0.9 % IJ SOLN: 10.0000 mL | INTRAMUSCULAR | Status: DC | PRN

## 2014-08-02 MED ORDER — METOPROLOL TARTRATE 1 MG/ML IV SOLN
5.0000 mg | Freq: Once | INTRAVENOUS | Status: AC
  Administered 2014-08-02: 5 mg via INTRAVENOUS

## 2014-08-02 MED ORDER — COLCHICINE 0.6 MG PO TABS
0.3000 mg | ORAL_TABLET | ORAL | Status: DC
  Administered 2014-08-04: 0.3 mg via ORAL
  Filled 2014-08-02 (×2): qty 0.5

## 2014-08-02 MED ORDER — SODIUM CHLORIDE 0.9 % IJ SOLN
3.0000 mL | Freq: Two times a day (BID) | INTRAMUSCULAR | Status: DC
  Administered 2014-08-02 – 2014-08-05 (×6): 3 mL via INTRAVENOUS

## 2014-08-02 MED ORDER — IOHEXOL 350 MG/ML SOLN
80.0000 mL | Freq: Once | INTRAVENOUS | Status: AC | PRN
  Administered 2014-08-02: 80 mL via INTRAVENOUS

## 2014-08-02 MED ORDER — SODIUM CHLORIDE 0.9 % IJ SOLN
10.0000 mL | Freq: Two times a day (BID) | INTRAMUSCULAR | Status: DC
  Administered 2014-08-02: 10 mL

## 2014-08-02 MED ORDER — DARBEPOETIN ALFA 40 MCG/0.4ML IJ SOSY
40.0000 ug | PREFILLED_SYRINGE | INTRAMUSCULAR | Status: DC
  Administered 2014-08-03: 40 ug via INTRAVENOUS
  Filled 2014-08-02: qty 0.4

## 2014-08-02 NOTE — Plan of Care (Signed)
Problem: Phase II Progression Outcomes Goal: Pain controlled Outcome: Completed/Met Date Met:  08/02/14 Goal: Tol increased activity, up in chair for at least 4 hrs/HD pt Outcome: Completed/Met Date Met:  08/02/14 tol oob  In chair 1.5-2hrs   tol well Goal: Discharge plan established Outcome: Completed/Met Date Met:  08/02/14

## 2014-08-02 NOTE — Progress Notes (Signed)
Speech Language Pathology Treatment: Dysphagia  Patient Details Name: [name] MRN: [id number] DOB: 1929/07/13 Today's Date: 08/02/2014 Time: 9407-6808 SLP Time Calculation (min) (ACUTE ONLY): 16 min  Assessment / Plan / Recommendation Clinical Impression  Pt was observed with regular textures and thin liquids with no overt s/s of aspiration observed. SLP provided Min cues for slow rate of intake and small bites, particularly with solid PO given mildly slow mastication. Even during periods of increased impulsivity, pt appeared to manage current diet textures well. SLP to sign off at this time as patient is tolerating regular textures and thin liquids, and is likely back to her baseline.   HPI HPI: 80 year old female admitted 07/27/14 due to increased confusion and weakness. PMH significant for COPD, ESRD, HTN, CHF, GERD, PEA with anoxic encephalopathy. Pt intubated 11/5-7/15. BSE ordered  to evaluate swallow function and safety and identify least restrictive diet.   Pertinent Vitals Pain Assessment: No/denies pain  SLP Plan  All goals met    Recommendations Diet recommendations: Regular;Thin liquid Liquids provided via: Cup;Straw Medication Administration: Whole meds with liquid Supervision: Patient able to self feed;Intermittent supervision to cue for compensatory strategies Compensations: Slow rate;Small sips/bites Postural Changes and/or Swallow Maneuvers: Seated upright 90 degrees              Oral Care Recommendations: Oral care BID Follow up Recommendations: None Plan: All goals met    GO      Germain Osgood, M.A. CCC-SLP [phone]  Germain Osgood 08/02/2014, 11:17 AM

## 2014-08-02 NOTE — Progress Notes (Signed)
NUTRITION FOLLOW UP  INTERVENTION:  Encourage adequate PO intake.  NUTRITION DIAGNOSIS: Inadequate oral intake related to inability to eat as evidenced by NPO status; Resolved.  NEW NUTRITION Dx: Increased nutrient needs related to chronic illness, ESRD as evidenced by estimated nutrition needs.  Goal: Intake to meet >/= 90% of estimated nutrition needs;met  Monitor:  PO intake, weight trends, labs, I/O's  80 y.o. female  Admitting Dx: Acute respiratory failure  ASSESSMENT: 80 y.o. female with a Past Medical History of End-stage renal disease,COPD on home O2, hypertension, atrial fibrillation, who was transferred from [facility] for evaluation of confusion and weakness.  Pt extubated 11/7. Pt is currently on a renal diet with 1200 ml fluids. Pt reports having a good appetite and eating well with no other difficulties. Pt was offered supplements for additional calories and protein, but pt declined saying she is fine and eating great. Pt was encouraged to eat her food at meals.   Labs: Low potassium, calcium, and GFR. High creatinine.  Height: Ht Readings from Last 1 Encounters:  07/27/14 5' 5"  (1.651 m)    Weight: Wt Readings from Last 1 Encounters:  08/01/14 143 lb 1.3 oz (64.9 kg)    BMI:  Body mass index is 23.81 kg/(m^2).  Re-Estimated Nutritional Needs: Kcal: 1700-1950 Protein: 85-95 gm Fluid: 1.2 L  Skin: +1 generalized edema  Diet Order: Diet renal W/1277m fluid restriction   Intake/Output Summary (Last 24 hours) at 08/02/14 0934 Last data filed at 08/01/14 1908  Gross per 24 hour  Intake      0 ml  Output   2251 ml  Net  -2251 ml    Last BM: 11/9  Labs:   Recent Labs Lab 07/30/14 0409 07/31/14 0510 08/01/14 0200 08/02/14 0450  NA 138 143 143 141  K 3.7 3.8 4.0 3.5*  CL 99 103 103 101  CO2 24 28 28 27   BUN 19 17 29* 16  CREATININE 5.11* 4.40* 6.26* 4.89*  CALCIUM 7.0* 7.3* 7.5* 7.5*  MG 1.9 1.9 2.1  --   PHOS 2.9 3.3 3.9  --    GLUCOSE 84 66* 91 86    CBG (last 3)  No results for input(s): GLUCAP in the last 72 hours.  Scheduled Meds: . allopurinol  100 mg Oral Daily  . amiodarone  200 mg Oral Daily  . antiseptic oral rinse  7 mL Mouth Rinse BID  . arformoterol  15 mcg Nebulization BID  . aspirin  81 mg Oral Daily  . budesonide  0.5 mg Nebulization BID  . calcitRIOL  1 mcg Oral Q M,W,F-HD  . ceFEPime (MAXIPIME) IV  2 g Intravenous Q M,W,F-HD  . colchicine  0.6 mg Oral Daily  . [START ON 08/03/2014] darbepoetin (ARANESP) injection - DIALYSIS  40 mcg Intravenous Q Wed-HD  . heparin  3,000 Units Dialysis Once in dialysis  . heparin  5,000 Units Subcutaneous 3 times per day  . ipratropium-albuterol  3 mL Nebulization Q6H  . multivitamin  1 tablet Oral QHS  . pantoprazole  40 mg Oral Daily  . sodium chloride  10-40 mL Intracatheter Q12H    Continuous Infusions:   Past Medical History  Diagnosis Date  . Hypertension   . Hyperlipidemia   . COPD (chronic obstructive pulmonary disease)   . Anemia   . ESRD (end stage renal disease) on dialysis   . CHF (congestive heart failure)   . GERD (gastroesophageal reflux disease)   . Constipation   . PEA (  Pulseless electrical activity) 04/19/2012    PEA arrest complicated by hyperkalemia, PNA, anocic encephalopathy, VDRF    Past Surgical History  Procedure Laterality Date  . Abdominal hysterectomy    . Arteriovenous fistula  06/02/12    left radiocephalic - failed  . Arteriovenous graft placement  07/14/12    Created at [place], MS, RD, LDN Pager # [phone] After hours/ weekend pager # [phone]

## 2014-08-02 NOTE — Progress Notes (Signed)
S: + cough but breathing OK.  O:BP 108/48 mmHg  Pulse 76  Temp(Src) 98.8 F (37.1 C) (Oral)  Resp 17  Ht 5\' 5"  (1.651 m)  Wt 64.9 kg (143 lb 1.3 oz)  BMI 23.81 kg/m2  SpO2 97%  Intake/Output Summary (Last 24 hours) at 08/02/14 0843 Last data filed at 08/01/14 1908  Gross per 24 hour  Intake      0 ml  Output   2251 ml  Net  -2251 ml   Weight change:  ZOX:WRUEAGen:awake and alert CVS:RRR Resp: decreased BS throughout Abd:+ BS NTND Ext: no edema NEURO: CNI  Still not completely oriented to year,  no asterixis Rt IJ Permcath.  Lt IJ triple lumen   . allopurinol  100 mg Oral Daily  . amiodarone  200 mg Oral Daily  . antiseptic oral rinse  7 mL Mouth Rinse BID  . arformoterol  15 mcg Nebulization BID  . aspirin  81 mg Oral Daily  . budesonide  0.5 mg Nebulization BID  . calcitRIOL  1 mcg Oral Q M,W,F-HD  . ceFEPime (MAXIPIME) IV  2 g Intravenous Q M,W,F-HD  . colchicine  0.6 mg Oral Daily  . [START ON 08/10/2014] darbepoetin (ARANESP) injection - DIALYSIS  25 mcg Intravenous Q14 Days  . heparin  3,000 Units Dialysis Once in dialysis  . heparin  5,000 Units Subcutaneous 3 times per day  . ipratropium-albuterol  3 mL Nebulization Q6H  . multivitamin  1 tablet Oral QHS  . pantoprazole  40 mg Oral Daily  . sodium chloride  10-40 mL Intracatheter Q12H   Dg Chest Port 1 View  08/01/2014   CLINICAL DATA:  Respiratory failure.  EXAM: PORTABLE CHEST - 1 VIEW  COMPARISON:  07/30/2014  FINDINGS: The endotracheal tube and nasogastric tube have been removed. Left IJ central line tip overlies the level of the superior vena cava. Right-sided dialysis catheter tip overlies the level of the upper right atrium.  The heart is enlarged. There is opacity at the lung bases, similar in appearance on the left and slightly increased on the right. Bilateral pleural effusions are suspected.  IMPRESSION: 1. Interval removal of nasogastric tube and endotracheal tube. 2. Increased atelectasis at the right lung  base.   Electronically Signed   By: Rosalie GumsBeth  Brown M.D.   On: 08/01/2014 07:13   BMET    Component Value Date/Time   NA 141 08/02/2014 0450   K 3.5* 08/02/2014 0450   CL 101 08/02/2014 0450   CO2 27 08/02/2014 0450   GLUCOSE 86 08/02/2014 0450   BUN 16 08/02/2014 0450   CREATININE 4.89* 08/02/2014 0450   CREATININE 1.69* 09/26/2011 1129   CALCIUM 7.5* 08/02/2014 0450   GFRNONAA 7* 08/02/2014 0450   GFRAA 9* 08/02/2014 0450   CBC    Component Value Date/Time   WBC 7.0 08/02/2014 0450   RBC 3.06* 08/02/2014 0450   HGB 9.1* 08/02/2014 0450   HCT 30.6* 08/02/2014 0450   PLT 136* 08/02/2014 0450   MCV 100.0 08/02/2014 0450   MCH 29.7 08/02/2014 0450   MCHC 29.7* 08/02/2014 0450   RDW 14.9 08/02/2014 0450   LYMPHSABS 1.7 08/01/2014 0200   MONOABS 0.9 08/01/2014 0200   EOSABS 0.3 08/01/2014 0200   BASOSABS 0.0 08/01/2014 0200     Assessment: 1. Resp failure sec PNA, pulm edema, COPD 2. Anemia on aranesp 3. Sec HPTH on calcitriol 4. COPD on home O2 Plan: 1. HD tomorrow 2.  Increase ambulation 3. Change  aranesp to q wed  Jeniya Flannigan T

## 2014-08-02 NOTE — Progress Notes (Signed)
Preliminary report:  No aortic dissection. No left atrial mass.  Full report to follow.  Lars MassonELSON, Kaija Kovacevic H 08/02/2014

## 2014-08-02 NOTE — Plan of Care (Addendum)
Problem: SLP Dysphagia Goals Goal: Patient will utilize recommended strategies Patient will utilize recommended strategies during swallow to increase swallowing safety with Min Assist. Outcome: Completed/Met Date Met:  08/02/14

## 2014-08-02 NOTE — Consult Note (Signed)
CARDIOLOGY CONSULT NOTE   Patient ID: [name] MRN: [id number] DOB/AGE: [birth year] 80 y.o.  Admit Date: 07/27/2014  Primary Physician: [name],[name], MD Primary Cardiologist  Don't know if she has in [place] ?   Clinical Summary [name] is a 80 y.o.female. [place] is here from [place]. There is end-stage renal disease. In addition the patient has had a severe pulmonary illness and is now improving. 2-D echo shows good left ventricular function with an ejection fraction of 60%. There are additional findings on the echo. In the reported mentions an echogenic structure at the sinotubular junction. There is suggestion of aneurysmal dilatation of the ascending aorta. With these findings aortic dissection cannot be ruled out. CT angiography will help with this. In addition there is question of a mobile mass attached to the intra-atrial septum. It is possible that the appearance is related all to the hypermobile intra-atrial septum. CT of the heart will also help with this. I'm aware that CT scan has been ordered for this morning. I've spoken with Dr. Delton [name] and I've spoken with radiology. We are trying to be sure that the optimal study is done. In the consultation to cardiology, there was question as to whether or not TEE is needed. I suspect that the answers will come from the CT scan and that TEE will not be needed.   No Known Allergies  Medications Scheduled Medications: . allopurinol  100 mg Oral Daily  . amiodarone  200 mg Oral Daily  . antiseptic oral rinse  7 mL Mouth Rinse BID  . arformoterol  15 mcg Nebulization BID  . aspirin  81 mg Oral Daily  . budesonide  0.5 mg Nebulization BID  . calcitRIOL  1 mcg Oral Q M,W,F-HD  . ceFEPime (MAXIPIME) IV  2 g Intravenous Q M,W,F-HD  . [START ON 08/04/2014] colchicine  0.3 mg Oral Once per day on Mon Thu  . [START ON 08/03/2014] darbepoetin (ARANESP) injection - DIALYSIS  40 mcg Intravenous Q Wed-HD  . heparin  3,000 Units Dialysis  Once in dialysis  . heparin  5,000 Units Subcutaneous 3 times per day  . ipratropium-albuterol  3 mL Nebulization Q6H  . multivitamin  1 tablet Oral QHS  . pantoprazole  40 mg Oral Daily  . sodium chloride  10-40 mL Intracatheter Q12H  . sodium chloride  3 mL Intravenous BID     Infusions:     PRN Medications:  sodium chloride, sodium chloride, albuterol, feeding supplement (NEPRO CARB STEADY), heparin, lidocaine-prilocaine, pentafluoroprop-tetrafluoroeth, sodium chloride   Past Medical History  Diagnosis Date  . Hypertension   . Hyperlipidemia   . COPD (chronic obstructive pulmonary disease)   . Anemia   . ESRD (end stage renal disease) on dialysis   . CHF (congestive heart failure)   . GERD (gastroesophageal reflux disease)   . Constipation   . PEA (Pulseless electrical activity) 04/19/2012    PEA arrest complicated by hyperkalemia, PNA, anocic encephalopathy, VDRF    Past Surgical History  Procedure Laterality Date  . Abdominal hysterectomy    . Arteriovenous fistula  06/02/12    left radiocephalic - failed  . Arteriovenous graft placement  07/14/12    Created at [facility]    History reviewed. No pertinent family history.  Social History [name] reports that she quit smoking about 7 years ago. Her smoking use included Cigarettes. She smoked 0.00 packs per day. She does not have any smokeless tobacco history on file. [name] reports that she  does not drink alcohol.  Review of Systems Patient denies fever, chills, headache, sweats, rash, change in vision, change in hearing, chest pain, cough, nausea or vomiting, urinary symptoms. All other systems are reviewed and are negative.  Physical Examination Blood pressure 108/48, pulse 76, temperature 98.8 F (37.1 C), temperature source Oral, resp. rate 17, height 5\' 5"  (1.651 m), weight 143 lb 1.3 oz (64.9 kg), SpO2 95 %.  Intake/Output Summary (Last 24 hours) at 08/02/14 1202 Last data filed at 08/01/14 1908  Gross  per 24 hour  Intake      0 ml  Output   2250 ml  Net  -2250 ml   The patient is now comfortable lying flat in bed. She is oriented to person time and place. She hopes to go home soon. Head is atraumatic. Sclera and conjunctiva are normal. There is no jugular venous distention. Lungs are clear. Respiratory effort is not labored. Cardiac exam reveals an S1 and S2. Abdomen is soft. There is no peripheral edema. There are no skin rashes. She has kyphosis of the thoracic spine. Neurologic is grossly intact.   Prior Cardiac Testing/Procedures  Lab Results  Basic Metabolic Panel:  Recent Labs Lab 07/27/14 1948 07/28/14 0555 07/29/14 0455 07/30/14 0409 07/31/14 0510 08/01/14 0200 08/02/14 0450  NA 138 140 140 138 143 143 141  K 4.1 4.6 3.6* 3.7 3.8 4.0 3.5*  CL 95* 99 100 99 103 103 101  CO2 30 27 25 24 28 28 27   GLUCOSE 127* 112* 79 84 66* 91 86  BUN 32* 20 31* 19 17 29* 16  CREATININE 7.96* 6.43* 7.51* 5.11* 4.40* 6.26* 4.89*  CALCIUM 7.5* 7.4* 6.8* 7.0* 7.3* 7.5* 7.5*  MG  --   --   --  1.9 1.9 2.1  --   PHOS 6.9* 6.5*  --  2.9 3.3 3.9  --     Liver Function Tests:  Recent Labs Lab 07/27/14 1948 07/28/14 0555 07/29/14 0455 08/02/14 0450  AST  --   --  19 17  ALT  --   --  <5 <5  ALKPHOS  --   --  36* 41  BILITOT  --   --  0.4 0.4  PROT  --   --  5.8* 6.5  ALBUMIN 2.8* 2.9* 2.4* 2.6*    CBC:  Recent Labs Lab 07/29/14 0630 07/30/14 0409 07/31/14 0510 08/01/14 0200 08/02/14 0450  WBC 7.1 7.7 8.9 8.6 7.0  NEUTROABS  --  5.5 6.9 5.7  --   HGB 9.8* 9.4* 9.2* 9.3* 9.1*  HCT 32.3* 30.6* 30.2* 31.3* 30.6*  MCV 99.1 96.8 99.7 103.3* 100.0  PLT 167 166 140* 164 136*    Cardiac Enzymes:  Recent Labs Lab 07/29/14 0455  TROPONINI <0.30    BNP: Invalid input(s): POCBNP   Radiology: Dg Chest Port 1 View  08/01/2014   CLINICAL DATA:  Respiratory failure.  EXAM: PORTABLE CHEST - 1 VIEW  COMPARISON:  07/30/2014  FINDINGS: The endotracheal tube and nasogastric  tube have been removed. Left IJ central line tip overlies the level of the superior vena cava. Right-sided dialysis catheter tip overlies the level of the upper right atrium.  The heart is enlarged. There is opacity at the lung bases, similar in appearance on the left and slightly increased on the right. Bilateral pleural effusions are suspected.  IMPRESSION: 1. Interval removal of nasogastric tube and endotracheal tube. 2. Increased atelectasis at the right lung base.   Electronically Signed  By: Rosalie GumsBeth  Brown M.D.   On: 08/01/2014 07:13     ECG:  I have reviewed her EKG from this admission. There is normal sinus rhythm. There is a mild interventricular conduction delay. QT interval is prolonged.  Telemetry:    Patient is not on telemetry at this time.   Impression and Recommendations  Abnormal echocardiogram..... Question aortic dissection..... Question mass on the atrial septum    As outlined in the lines above, the echo from July 30, 2014 is read as showing a target at the sinotubular junction with question of dilatation of the ascending aorta after this. This raises the question of the possibility of dissection. Clinically this has not been the case. The patient will have a CT scan today to assess this area of the aorta. It is possible that this area only represents an area of calcification within the aortic wall. However we will know more after the CT scan. There is also question of a mass on the atrial septum. We will learn more information from the CT scan about this. It is possible that all of the findings relate to the hypermobility of the interatrial septum. The CT scan should give enough information to make any further clinical decisions. At this point it is very unlikely that transesophageal echocardiography will be needed. We will follow with you.    Acute respiratory failure      Patient is markedly improved.    Encephalopathy acute     Patient is markedly improved.    ESRD on  dialysis      Nephrology is managing dialysis in the hospital.  Jerral BonitoJeff Monty Spicher, MD 08/02/2014, 12:02 PM

## 2014-08-02 NOTE — Progress Notes (Addendum)
Patient ID: [name]  female  AVW:[id number]    DOB: [birth year]    DOA: 07/27/2014  PCP: [name],[name], MD  Brief history of present illness and hospitalization course  Patient is a 80 year old female with a history of atrial fibrillation, COPD on home O2, ESRD on HD MWF, hypertension, transferred [facility] on 07/27/14 with acute encephalopathy. Patient had worsening weakness and decreased appetite over the past few weeks prior to admission. She had increasing confusion of the past few days prior to admission. She was also recently started on Remeron by PCP about 3 weeks prior. At [facility], she was found to have a UTI. She was [facility], started on empiric antibiotics for UTI. On 11/5 patient had a rapid response with acute respiratory failure, CCM was consulted. Patient was transferred to ICU, she was intubated 07/28/14 through 07/30/14 with lower left lung pneumonia. Patient was also placed on pressors but off since 11/6, extubated on 07/30/14. Patient was transferred to the floor on 08/01/14.   Assessment/Plan: Principal Problem:   Acuteon chronic hypercarbic respiratory failure due to COPD, LL pneumonia - resolved, extubated on 11/7, sats stable, 97% on 3 L O2 via nasal cannula - Currently on cefepime  Active Problems:   Essential hypertension: stable  UTI with toxic metabolic encephalopathy - Improving, urine culture unrevealing, currently on cefepime for total 7 days  ? Mass in the interatrial septum 2-D echo on 11/7 showed highly echogenic structure in the sinotubular junction 7 x 5 mm Aneurysmal ascending thoracic aorta, moderate aortic regurgitation, aortic dissection cannot be excluded, mobile mass in the interim atrial septum, recommending CTA chest - ordered CT angio chest today 11/10, also called cardiology consult if patient needs TEE?    ESRD on dialysis - nephrology consulted, patient undergoing hemodialysis, MWF    A-fib -  chronic atrial fibrillation, on amiodarone, normal sinus rhythm at this time - not on any anticoagulation    COPD (chronic obstructive pulmonary disease) - currently well compensated, continue Duo nebs, O2, IV cefepime, brovana  DVT Prophylaxis:  Code Status: full code  Family Communication:  Disposition:  Consultants:  Neph[facility]  [facility]  Procedures: 11/7 - TTE - normal systolic fxn - EF 60-65% - no WMA - grade 1 DD - highly echogenic structure in the sinotubular junction measuring 7 x 5 mm and aneurysmal appearing ascending thoracic aorta. There is also moderate aortic regurgitation. Aortic dissection can't be excluded and further evaluation with chest CTA is recommended. There appears to be a mobile mass attached to the interatrial septum measuring 13 x 17 mm. This is possibly due to hypermobile interatrial septum, but further evaluation with CT is recommended.  Antibiotics:  IV cefepime 11/4>  IV vancomycin  Subjective: Patient seen and examined,feels a lot better today, denies any specific complaints, no significant chest pain or abdominal pain, shortness of breath  Objective: Weight change:   Intake/Output Summary (Last 24 hours) at 08/02/14 0923 Last data filed at 08/01/14 1908  Gross per 24 hour  Intake      0 ml  Output   2251 ml  Net  -2251 ml   Blood pressure 108/48, pulse 76, temperature 98.8 F (37.1 C), temperature source Oral, resp. rate 17, height 5\' 5"  (1.651 m), weight 64.9 kg (143 lb 1.3 oz), SpO2 97 %.  Physical Exam: General: Alert and awake, oriented x2, not in any acute distress. CVS: S1-S2 clear Chest: decreased breath sounds at the bases Abdomen: soft nontender, nondistended,  normal bowel sounds  Extremities: no cyanosis, clubbing or edema noted bilaterally  Lab Results: Basic Metabolic Panel:  Recent Labs Lab 08/01/14 0200 08/02/14 0450  NA 143 141  K 4.0 3.5*  CL 103 101  CO2 28 27  GLUCOSE 91 86  BUN 29* 16  CREATININE 6.26*  4.89*  CALCIUM 7.5* 7.5*  MG 2.1  --   PHOS 3.9  --    Liver Function Tests:  Recent Labs Lab 07/29/14 0455 08/02/14 0450  AST 19 17  ALT <5 <5  ALKPHOS 36* 41  BILITOT 0.4 0.4  PROT 5.8* 6.5  ALBUMIN 2.4* 2.6*   No results for input(s): LIPASE, AMYLASE in the last 168 hours. No results for input(s): AMMONIA in the last 168 hours. CBC:  Recent Labs Lab 08/01/14 0200 08/02/14 0450  WBC 8.6 7.0  NEUTROABS 5.7  --   HGB 9.3* 9.1*  HCT 31.3* 30.6*  MCV 103.3* 100.0  PLT 164 136*   Cardiac Enzymes:  Recent Labs Lab 07/29/14 0455  TROPONINI <0.30   BNP: Invalid input(s): POCBNP CBG:  Recent Labs Lab 07/29/14 1139 07/29/14 1628 07/29/14 2012 07/29/14 2312 07/30/14 0438  GLUCAP 103* 106* 98 87 86     Micro Results: Recent Results (from the past 240 hour(s))  Culture, Urine     Status: None   Collection Time: 07/27/14  6:23 PM  Result Value Ref Range Status   Specimen Description URINE, RANDOM  Final   Special Requests NONE  Final   Culture  Setup Time   Final    07/27/2014 22:15 Performed at [facility]    Colony Count NO GROWTH Performed at [facility]   Final   Culture NO GROWTH Performed at [facility]   Final   Report Status 07/28/2014 FINAL  Final  Culture, blood (routine x 2)     Status: None   Collection Time: 07/27/14  6:50 PM  Result Value Ref Range Status   Specimen Description BLOOD RIGHT ARM  Final   Special Requests BOTTLES DRAWN AEROBIC ONLY 5CC  Final   Culture  Setup Time   Final    07/27/2014 22:20 Performed at [facility]    Culture   Final    NO GROWTH 5 DAYS Performed at [facility]    Report Status 08/02/2014 FINAL  Final  MRSA PCR Screening     Status: None   Collection Time: 07/27/14  6:51 PM  Result Value Ref Range Status   MRSA by PCR NEGATIVE NEGATIVE Final    Comment:        The GeneXpert MRSA Assay (FDA approved for NASAL specimens only), is one component of  a comprehensive MRSA colonization surveillance program. It is not intended to diagnose MRSA infection nor to guide or monitor treatment for MRSA infections.   Culture, blood (routine x 2)     Status: None   Collection Time: 07/27/14  6:55 PM  Result Value Ref Range Status   Specimen Description BLOOD RIGHT ARM  Final   Special Requests BOTTLES DRAWN AEROBIC ONLY 5CC  Final   Culture  Setup Time   Final    07/27/2014 22:23 Performed at [facility]    Culture   Final    NO GROWTH 5 DAYS Performed at [facility]    Report Status 08/02/2014 FINAL  Final  Culture, respiratory (NON-Expectorated)     Status: None   Collection Time: 07/28/14 10:25 AM  Result  Value Ref Range Status   Specimen Description TRACHEAL ASPIRATE  Final   Special Requests NONE  Final   Gram Stain   Final    RARE WBC PRESENT,BOTH PMN AND MONONUCLEAR RARE SQUAMOUS EPITHELIAL CELLS PRESENT RARE GRAM POSITIVE COCCI IN PAIRS IN CLUSTERS RARE YEAST Performed at [facility]    Culture   Final    Non-Pathogenic Oropharyngeal-type Flora Isolated. Performed at [facility]    Report Status 07/30/2014 FINAL  Final    Studies/Results: Dg Chest Port 1 View  08/01/2014   CLINICAL DATA:  Respiratory failure.  EXAM: PORTABLE CHEST - 1 VIEW  COMPARISON:  07/30/2014  FINDINGS: The endotracheal tube and nasogastric tube have been removed. Left IJ central line tip overlies the level of the superior vena cava. Right-sided dialysis catheter tip overlies the level of the upper right atrium.  The heart is enlarged. There is opacity at the lung bases, similar in appearance on the left and slightly increased on the right. Bilateral pleural effusions are suspected.  IMPRESSION: 1. Interval removal of nasogastric tube and endotracheal tube. 2. Increased atelectasis at the right lung base.   Electronically Signed   By: Rosalie Gums M.D.   On: 08/01/2014 07:13   Dg Chest Port 1 View  07/30/2014    CLINICAL DATA:  80 year old female currently admitted with acute respiratory failure. Subsequent encounter.  EXAM: PORTABLE CHEST - 1 VIEW  COMPARISON:  Multiple recent prior chest x-rays. Most recent prior chest x-ray 07/29/2014  FINDINGS: The tip of the endotracheal tube is 2.2 cm above the carina. The proximal side hole of the nasogastric tube overlies the gastric fundus. A left IJ approach central venous catheter has advanced. The tip now overlies the mid SVC in good position. Right IJ approach tunneled hemodialysis catheter. Catheter tip at the superior cavoatrial junction in good position.  Stable enlargement of the cardiopericardial silhouette. Probable left pleural effusion and associated atelectasis. Slight interval improvement in pulmonary vascular congestion. Inspiratory volumes remain low. No pneumothorax.  IMPRESSION: 1. The left IJ central venous catheter has advanced so that the tip now overlies the mid SVC. Other support apparatus remain in unchanged position. 2. Resolved pulmonary vascular congestion. 3. Persistent globular enlargement of the cardiopericardial silhouette which may reflect cardiomegaly and/or pericardial effusion. 4. Persistent low inspiratory volumes with left greater than right basilar atelectasis. 5. Suspect layering left pleural effusion.   Electronically Signed   By: Malachy Moan M.D.   On: 07/30/2014 07:39   Dg Chest Port 1 View  07/29/2014   CLINICAL DATA:  Respiratory failure.  Shortness of breath.  EXAM: PORTABLE CHEST - 1 VIEW  COMPARISON:  07/28/2014  FINDINGS: Endotracheal tube is in place with tip 2.5 cm above carina. Nasogastric tube is in place with tip off the film but the on the gastroesophageal junction. Right IJ central line tip overlies the level of the superior vena cava. Left IJ central line tip overlies the level of the brachiocephalic vein confluence with the superior vena cava.  Heart is enlarged. There is dense opacity at the left lung base which  obscures the hemidiaphragm. Opacity has increased over recent exams. There is mild perihilar edema.  IMPRESSION: 1. Dense left lower lobe consolidation, increased. 2. Cardiomegaly and mild edema.   Electronically Signed   By: Rosalie Gums M.D.   On: 07/29/2014 07:10   Dg Chest Port 1 View  07/28/2014   CLINICAL DATA:  Evaluate ET tube placement  EXAM: PORTABLE CHEST -  1 VIEW  COMPARISON:  07/28/2014  FINDINGS: There is a left IJ catheter with tip in the projection of the SVC. Right-sided dialysis catheter is noted with tip in the cavoatrial junction. The ET tube tip is at the carina. There is a nasogastric tube with side port below GE junction. Stable cardiac enlargement. Calcified plaque is noted within the thoracic aorta. There is a moderate left pleural effusion noted.  IMPRESSION: 1. Support apparatus positioned as described above. 2. Cardiac enlargement and CHF.  Unchanged.   Electronically Signed   By: Signa Kellaylor  Stroud M.D.   On: 07/28/2014 10:08   Dg Chest Port 1 View  07/28/2014   CLINICAL DATA:  Acute onset shortness of breath. End-stage renal disease on hemodialysis.  EXAM: PORTABLE CHEST - 1 VIEW  COMPARISON:  Two-view chest x-ray yesterday, 07/05/2014, 03/17/2014.  FINDINGS: Cardiac silhouette markedly enlarged but stable. Pulmonary venous hypertension without overt pulmonary edema. Bilateral pleural effusions, left greater than right. Prominent paracardiac fat pad on the left mimics a lingular opacity, but this has been present on multiple prior examinations. Dialysis catheter tips project over the lower SVC and cavoatrial junction.  IMPRESSION: Stable marked cardiomegaly. Pulmonary venous hypertension without overt edema. Bilateral pleural effusions, left greater than right. No acute cardiopulmonary disease otherwise.   Electronically Signed   By: Hulan Saashomas  Lawrence M.D.   On: 07/28/2014 08:56    Medications: Scheduled Meds: . allopurinol  100 mg Oral Daily  . amiodarone  200 mg Oral Daily  .  antiseptic oral rinse  7 mL Mouth Rinse BID  . arformoterol  15 mcg Nebulization BID  . aspirin  81 mg Oral Daily  . budesonide  0.5 mg Nebulization BID  . calcitRIOL  1 mcg Oral Q M,W,F-HD  . ceFEPime (MAXIPIME) IV  2 g Intravenous Q M,W,F-HD  . colchicine  0.6 mg Oral Daily  . [START ON 08/03/2014] darbepoetin (ARANESP) injection - DIALYSIS  40 mcg Intravenous Q Wed-HD  . heparin  3,000 Units Dialysis Once in dialysis  . heparin  5,000 Units Subcutaneous 3 times per day  . ipratropium-albuterol  3 mL Nebulization Q6H  . multivitamin  1 tablet Oral QHS  . pantoprazole  40 mg Oral Daily  . sodium chloride  10-40 mL Intracatheter Q12H      LOS: 6 days   Meriel Kelliher M.D. [facility] 08/02/2014, 9:23 AM Pager: [phone]  If 7PM-7AM, please contact night-coverage [email or web address] Password [email or web address]

## 2014-08-02 NOTE — Progress Notes (Signed)
ANTIBIOTIC CONSULT NOTE - Follow up  Pharmacy Consult for Cefepime Indication: PNA  No Known Allergies  Patient Measurements: Height: 5\' 5"  (165.1 cm) (Simultaneous filing. User may not have seen previous data.) Weight: 143 lb 1.3 oz (64.9 kg) IBW/kg (Calculated) : 57    Microbiology: Recent Results (from the past 720 hour(s))  Culture, Urine     Status: None   Collection Time: 07/27/14  6:23 PM  Result Value Ref Range Status   Specimen Description URINE, RANDOM  Final   Special Requests NONE  Final   Culture  Setup Time   Final    07/27/2014 22:15 Performed at [facility]    Colony Count NO GROWTH Performed at [facility]   Final   Culture NO GROWTH Performed at [facility]   Final   Report Status 07/28/2014 FINAL  Final  Culture, blood (routine x 2)     Status: None   Collection Time: 07/27/14  6:50 PM  Result Value Ref Range Status   Specimen Description BLOOD RIGHT ARM  Final   Special Requests BOTTLES DRAWN AEROBIC ONLY 5CC  Final   Culture  Setup Time   Final    07/27/2014 22:20 Performed at [facility]    Culture   Final    NO GROWTH 5 DAYS Performed at [facility]    Report Status 08/02/2014 FINAL  Final  MRSA PCR Screening     Status: None   Collection Time: 07/27/14  6:51 PM  Result Value Ref Range Status   MRSA by PCR NEGATIVE NEGATIVE Final    Comment:        The GeneXpert MRSA Assay (FDA approved for NASAL specimens only), is one component of a comprehensive MRSA colonization surveillance program. It is not intended to diagnose MRSA infection nor to guide or monitor treatment for MRSA infections.   Culture, blood (routine x 2)     Status: None   Collection Time: 07/27/14  6:55 PM  Result Value Ref Range Status   Specimen Description BLOOD RIGHT ARM  Final   Special Requests BOTTLES DRAWN AEROBIC ONLY 5CC  Final   Culture  Setup Time   Final    07/27/2014 22:23 Performed at [facility]    Culture   Final    NO GROWTH 5 DAYS Performed at [facility]    Report Status 08/02/2014 FINAL  Final  Culture, respiratory (NON-Expectorated)     Status: None   Collection Time: 07/28/14 10:25 AM  Result Value Ref Range Status   Specimen Description TRACHEAL ASPIRATE  Final   Special Requests NONE  Final   Gram Stain   Final    RARE WBC PRESENT,BOTH PMN AND MONONUCLEAR RARE SQUAMOUS EPITHELIAL CELLS PRESENT RARE GRAM POSITIVE COCCI IN PAIRS IN CLUSTERS RARE YEAST Performed at [facility]    Culture   Final    Non-Pathogenic Oropharyngeal-type Flora Isolated. Performed at [facility]    Report Status 07/30/2014 FINAL  Final   Assessment: 80 year old female with a past medical history significant for afib, COPD on home oyxgen and ESRD (MWF HD).  New LLL PNA (11/6). WBC WNL, currently afebrile. Today is D#6 of therapy with cefepime No substantial growth on cultures.  Goal of Therapy:  Appropriate antibiotic dosing  Plan:  -Cefepime 2 grams IV QHD MWF- last dose tomorrow for a 7 day treatment course -pharmacy to sign off, please re-consult if needed  Carollee Nussbaumer D.  Nevaya Nagele, PharmD, BCPS Clinical Pharmacist Pager: [phone] 08/02/2014 12:29 PM

## 2014-08-02 NOTE — Clinical Social Work Psychosocial (Signed)
Clinical Social Work Department BRIEF PSYCHOSOCIAL ASSESSMENT 08/02/2014  Patient:  [name],[name]     Account Number:  [id number]     Admit date:  07/27/2014  Clinical Social Worker:  Arrie AranGIVENS,Kieren Ricci,  Date/Time:  08/02/2014 11:33 AM  Referred by:  Physician  Date Referred:  08/02/2014 Referred for  SNF Placement   Other Referral:   Interview type:   Other interview type:    PSYCHOSOCIAL DATA Living Status:  ALONE Admitted from facility:   Level of care:  Skilled Nursing Facility Primary support name:  [name], [name] Primary support relationship to patient:  CHILD, ADULT Degree of support available:   [name] - [phone]  [name] LeschesCarrie [name] - [phone]    CURRENT CONCERNS  Other Concerns:    SOCIAL WORK ASSESSMENT / PLAN  Assessment/plan status:  Psychosocial Support/Ongoing Assessment of Needs Other assessment/ plan:   CSW and CSW intern spoke with patient concerning MD is recommending SNF placement. Patient expressed that [facility] and would like to return there if possible. Patient daughter [name] was present . CSW-Intern was unable  to contact the daughter [name], a voicemail was left.   Information/referral to community resources:   Patient was provided [facility].    PATIENT'S/FAMILY'S RESPONSE TO PLAN OF CARE: Patient was pleasant and aggreeable.     Arrie AranZoevenia Duchess [name] CSW-Intern

## 2014-08-03 DIAGNOSIS — R931 Abnormal findings on diagnostic imaging of heart and coronary circulation: Secondary | ICD-10-CM

## 2014-08-03 DIAGNOSIS — R918 Other nonspecific abnormal finding of lung field: Secondary | ICD-10-CM | POA: Diagnosis present

## 2014-08-03 DIAGNOSIS — J9600 Acute respiratory failure, unspecified whether with hypoxia or hypercapnia: Secondary | ICD-10-CM

## 2014-08-03 DIAGNOSIS — J9612 Chronic respiratory failure with hypercapnia: Secondary | ICD-10-CM

## 2014-08-03 LAB — CBC
HCT: 31.7 % — ABNORMAL LOW (ref 36.0–46.0)
Hemoglobin: 9.4 g/dL — ABNORMAL LOW (ref 12.0–15.0)
MCH: 30.6 pg (ref 26.0–34.0)
MCHC: 29.7 g/dL — AB (ref 30.0–36.0)
MCV: 103.3 fL — AB (ref 78.0–100.0)
PLATELETS: 169 10*3/uL (ref 150–400)
RBC: 3.07 MIL/uL — AB (ref 3.87–5.11)
RDW: 15 % (ref 11.5–15.5)
WBC: 6 10*3/uL (ref 4.0–10.5)

## 2014-08-03 LAB — RENAL FUNCTION PANEL
ALBUMIN: 2.7 g/dL — AB (ref 3.5–5.2)
Anion gap: 14 (ref 5–15)
BUN: 27 mg/dL — ABNORMAL HIGH (ref 6–23)
CO2: 26 meq/L (ref 19–32)
CREATININE: 7.44 mg/dL — AB (ref 0.50–1.10)
Calcium: 8.1 mg/dL — ABNORMAL LOW (ref 8.4–10.5)
Chloride: 101 mEq/L (ref 96–112)
GFR, EST AFRICAN AMERICAN: 5 mL/min — AB (ref >90)
GFR, EST NON AFRICAN AMERICAN: 4 mL/min — AB (ref >90)
Glucose, Bld: 93 mg/dL (ref 70–99)
PHOSPHORUS: 4.7 mg/dL — AB (ref 2.3–4.6)
Potassium: 3.7 mEq/L (ref 3.7–5.3)
SODIUM: 141 meq/L (ref 137–147)

## 2014-08-03 MED ORDER — DARBEPOETIN ALFA 40 MCG/0.4ML IJ SOSY
PREFILLED_SYRINGE | INTRAMUSCULAR | Status: AC
  Administered 2014-08-03: 40 ug via INTRAVENOUS
  Filled 2014-08-03: qty 0.4

## 2014-08-03 MED ORDER — MIDODRINE HCL 5 MG PO TABS
ORAL_TABLET | ORAL | Status: AC
  Administered 2014-08-03: 10 mg via ORAL
  Filled 2014-08-03: qty 2

## 2014-08-03 MED ORDER — MIDODRINE HCL 5 MG PO TABS
10.0000 mg | ORAL_TABLET | Freq: Two times a day (BID) | ORAL | Status: DC
  Administered 2014-08-03 – 2014-08-05 (×4): 10 mg via ORAL
  Filled 2014-08-03 (×7): qty 2

## 2014-08-03 MED ORDER — MIDODRINE HCL 5 MG PO TABS
10.0000 mg | ORAL_TABLET | Freq: Two times a day (BID) | ORAL | Status: DC
  Administered 2014-08-03: 10 mg via ORAL
  Filled 2014-08-03 (×2): qty 2

## 2014-08-03 NOTE — Procedures (Signed)
Pt seen on HD.  BFR 400 Ap 180 Vp 150.  BP remains low making it difficult to dialyze.  Will start midodrine.  Needs to work with rehab to increase ambulation.

## 2014-08-03 NOTE — Clinical Social Work Placement (Signed)
Clinical Social Work Department CLINICAL SOCIAL WORK PLACEMENT NOTE 08/03/2014  [name]  Account Number:  [id number] Admit date:  07/27/2014  Clinical Social Worker:  Genelle BalVANESSA Jadae Steinke, LCSW  Date/time:  08/03/2014 12:26 PM  Clinical Social Work is seeking post-discharge placement for this patient at the following level of care:   SKILLED NURSING   (*CSW will update this form in Epic as items are completed)   08/02/2014  Patient/family provided with [facility] Department of Clinical Social Work's list of facilities offering this level of care within the geographic area requested by the patient (or if unable, by the patient's family).  08/02/2014  Patient/family informed of their freedom to choose among providers that offer the needed level of care, that participate in Medicare, Medicaid or managed care program needed by the patient, have an available bed and are willing to accept the patient.    Patient/family informed of [facility]' ownership interest in [facility], as well as of the fact that they are under no obligation to receive care at this facility.  PASARR submitted to EDS on 01/15/2011 PASARR number received on 01/15/2011  FL2 transmitted to all facilities in geographic area requested by pt/family on  08/02/2014 FL2 transmitted to all facilities within larger geographic area on   Patient informed that his/her managed care company has contracts with or will negotiate with  certain facilities, including the following:     Patient/family informed of bed offers received:  08/03/2014 [facility] Physician recommends and patient chooses bed at    Patient to be transferred to  on   Patient to be transferred to facility by  Patient and family notified of transfer on  Name of family member notified:    The following physician request were entered in Epic:  Additional Comments: 08/03/14: CSW spoke by phone with  patient's daughter [name] and provided bed offers. [facility] [facility]

## 2014-08-03 NOTE — Progress Notes (Signed)
Physical Therapy Treatment Patient Details Name: [name] MRN: [id number] DOB: 1929-05-31 Today's Date: 08/03/2014    History of Present Illness 6F with history atrial fibrillation, COPD on home O2, ESRD on HD MWF, HTN [facility] 07/27/2014 with acute encephalopathy, weakness, confusion, UTI.[facility]. VDRF 11/5-11/7.    PT Comments    Pt progressing towards physical therapy goals. Pt very fatigued and lethargic from HD this morning. Focus of session was to reposition pt in bed. Pt able to assist somewhat with positioning, however mod assist was required to scoot to [facility]. Pt continues to be confused - not oriented to time, place or situation.   Follow Up Recommendations  SNF;Supervision/Assistance - 24 hour     Equipment Recommendations  None recommended by PT    Recommendations for Other Services OT consult     Precautions / Restrictions Precautions Precautions: Fall Precaution Comments: orthostatic hypotension Restrictions Weight Bearing Restrictions: No    Mobility  Bed Mobility Overal bed mobility: Needs Assistance Bed Mobility: Rolling Rolling: Supervision         General bed mobility comments: Pt able to roll with supervision, however did not understand that PT asking her to roll to her back from the sidelying position - stating "I can't". Pt assisted with scooting herself up in the bed (mod assist with bed pad).  Transfers                    Ambulation/Gait                 Stairs            Wheelchair Mobility    Modified Rankin (Stroke Patients Only)       Balance                                    Cognition Arousal/Alertness: Awake/alert Behavior During Therapy: Flat affect Overall Cognitive Status: Impaired/Different from baseline Area of Impairment: Memory;Problem solving     Memory: Decreased short-term memory       Problem Solving: Slow  processing;Difficulty sequencing;Requires verbal cues General Comments: Pt continues to be disoriented. States she goes for a place ride when she goes to dialysis.     Exercises      General Comments        Pertinent Vitals/Pain Pain Assessment: No/denies pain    Home Living                      Prior Function            PT Goals (current goals can now be found in the care plan section) Acute Rehab PT Goals Patient Stated Goal: to go back to my apartment  PT Goal Formulation: With patient Time For Goal Achievement: 08/07/14 Potential to Achieve Goals: Good Progress towards PT goals: Progressing toward goals    Frequency  Min 2X/week    PT Plan Discharge plan needs to be updated    Co-evaluation             End of Session Equipment Utilized During Treatment: Oxygen Activity Tolerance: Patient limited by fatigue;Patient limited by lethargy (Due to HD this morning) Patient left: in bed;with call bell/phone within reach;with bed alarm set     Time: 1435-1450 PT Time Calculation (min) (ACUTE ONLY): 15 min  Charges:  $Therapeutic Activity: 8-22 mins  G Codes:      Conni SlipperKirkman, Ugochi Henzler 08/03/2014, 3:11 PM  Conni SlipperLaura Orla Jolliff, PT, DPT Acute Rehabilitation Services Pager: [phone]

## 2014-08-03 NOTE — Progress Notes (Signed)
PROGRESS NOTE  Kiyla Ringler ZOX:[id number] DOB: 1928/10/28 DOA: 07/27/2014 PCP: [name], MD  HPI/Recap of past 24 hours: 80 year female with past medical history atrial fibrillation, end-stage renal disease on hemodialysis and COPD transferred from [facility] on 11/4 for acute encephalopathy. Prior to admission there, patient had reported increasing weakness and decreased appetite for several weeks and then confusion for several days prior to admission. Had been recently started on Remeron 3 weeks prior. Also noted to have urinary tract infection at outside hospital. Following admission, patient developed acute respiratory failure and was intubated on 11/5 through 11/7 with cause felt to be left lower lung pneumonia. Patient also required pressors. She is able to be successfully resuscitated and extubated and transferred to the hospitalist service on 11/9.  Since that time, patient has continued to improve. Encephalopathy has become more more minimal. Patient had an echocardiogram done on 11/7 when assessing her respiratory failure which noted a questionable aneurysmal area or mass. Cardiology consult and patient underwent CT angiogram of chest done 11/11 which ruled out both. She has continued to receive dialysis as per nephrology. In workup for her respiratory status, patient underwent CT angiogram of the chest to rule out pulmonary embolus. No pulmonary embolus noted, however a Lobulated central pulmonary mass of the right upper lobe measuring 1.3 x 2.0 x 1.4 cm. This has an appearance most likely consistent with primary lung carcinoma, which has not been yet discussed with the patient.  Patient seen today in dialysis. She is doing okay. Denies any shortness of breath or pain  Assessment/Plan: Principal Problem:   Acuteon chronic hypercarbic respiratory failure: Felt to be secondary to COPD and left lower lobe pneumonia, resolved with stable oxygenation. Currently on minimal oxygen.  Finishing up antibiotics. Active Problems:   Essential hypertension: Stable    Encephalopathy acute:felt to be secondary to urinary tract infection: Urine culture unrevealing, finishing up cefepime    ESRD on dialysis: Being seen by nephrology   A-fib: Rate controlled, currently normal sinus rhythm, on amiodarone, not on anticoagulation    Abnormal echocardiogram: Confirmed the TEE no evidence of aneurysm or mass   Lung mass: Highly suspicious suspicious for mortgage and carcinoma. We'll discuss with patient and involved pulmonary accordingly   Code Status: full code  Family Communication: we'll plan to discuss lung mass highly suspicious for cancer with patient along with her granddaughter  Disposition Plan: discharge to skilled nursing syndrome, disposition to be determined on pulmonary workup   Consultants:  Nephrology  Pulmonary  Cardiology  Procedures:  11/7 - TTE - normal systolic fxn - EF 60-65% - no WMA - grade 1 DD - highly echogenic structure in the sinotubular junction measuring 7 x 5 mm and aneurysmal appearing ascending thoracic aorta. There is also moderate aortic regurgitation. Aortic dissection can't be excluded and further evaluation with chest CTA is recommended. There appears to be a mobile mass attached to the interatrial septum measuring 13 x 17 mm. This is possibly due to hypermobile interatrial septum, but further evaluation with CT is recommended.  Antibiotics:  IV cefepime 11/4-11/11  IV vancomycin-discontinued   Objective: BP 103/47 mmHg  Pulse 73  Temp(Src) 97.8 F (36.6 C) (Oral)  Resp 18  Ht 5\' 5"  (1.651 m)  Wt 63.8 kg (140 lb 10.5 oz)  BMI 23.41 kg/m2  SpO2 92%  Intake/Output Summary (Last 24 hours) at 08/03/14 1416 Last data filed at 08/03/14 1223  Gross per 24 hour  Intake    480 ml  Output   1346 ml  Net   -866 ml   Filed Weights   08/02/14 2045 08/03/14 0838 08/03/14 1223  Weight: 65.7 kg (144 lb 13.5 oz) 65.1 kg (143 lb  8.3 oz) 63.8 kg (140 lb 10.5 oz)    Exam:   General:  Alert and oriented 2, no acute distress  Cardiovascular: regular rate and rhythm, S1-S2  Respiratory: clear to auscultation bilaterally  Abdomen: soft, nontender, nondistended, positive bowel sounds  Musculoskeletal: no clubbing or cyanosis or edema   Data Reviewed: Basic Metabolic Panel:  Recent Labs Lab 07/28/14 0555  07/30/14 0409 07/31/14 0510 08/01/14 0200 08/02/14 0450 08/03/14 0757  NA 140  < > 138 143 143 141 141  K 4.6  < > 3.7 3.8 4.0 3.5* 3.7  CL 99  < > 99 103 103 101 101  CO2 27  < > 24 28 28 27 26   GLUCOSE 112*  < > 84 66* 91 86 93  BUN 20  < > 19 17 29* 16 27*  CREATININE 6.43*  < > 5.11* 4.40* 6.26* 4.89* 7.44*  CALCIUM 7.4*  < > 7.0* 7.3* 7.5* 7.5* 8.1*  MG  --   --  1.9 1.9 2.1  --   --   PHOS 6.5*  --  2.9 3.3 3.9  --  4.7*  < > = values in this interval not displayed. Liver Function Tests:  Recent Labs Lab 07/27/14 1948 07/28/14 0555 07/29/14 0455 08/02/14 0450 08/03/14 0757  AST  --   --  19 17  --   ALT  --   --  <5 <5  --   ALKPHOS  --   --  36* 41  --   BILITOT  --   --  0.4 0.4  --   PROT  --   --  5.8* 6.5  --   ALBUMIN 2.8* 2.9* 2.4* 2.6* 2.7*   No results for input(s): LIPASE, AMYLASE in the last 168 hours. No results for input(s): AMMONIA in the last 168 hours. CBC:  Recent Labs Lab 07/30/14 0409 07/31/14 0510 08/01/14 0200 08/02/14 0450 08/03/14 0757  WBC 7.7 8.9 8.6 7.0 6.0  NEUTROABS 5.5 6.9 5.7  --   --   HGB 9.4* 9.2* 9.3* 9.1* 9.4*  HCT 30.6* 30.2* 31.3* 30.6* 31.7*  MCV 96.8 99.7 103.3* 100.0 103.3*  PLT 166 140* 164 136* 169   Cardiac Enzymes:    Recent Labs Lab 07/29/14 0455  TROPONINI <0.30   BNP (last 3 results) No results for input(s): PROBNP in the last 8760 hours. CBG:  Recent Labs Lab 07/29/14 1139 07/29/14 1628 07/29/14 2012 07/29/14 2312 07/30/14 0438  GLUCAP 103* 106* 98 87 86    Recent Results (from the past 240 hour(s))    Culture, Urine     Status: None   Collection Time: 07/27/14  6:23 PM  Result Value Ref Range Status   Specimen Description URINE, RANDOM  Final   Special Requests NONE  Final   Culture  Setup Time   Final    07/27/2014 22:15 Performed at [facility]    Colony Count NO GROWTH Performed at [facility]   Final   Culture NO GROWTH Performed at [facility]   Final   Report Status 07/28/2014 FINAL  Final  Culture, blood (routine x 2)     Status: None   Collection Time: 07/27/14  6:50 PM  Result Value Ref Range Status   Specimen  Description BLOOD RIGHT ARM  Final   Special Requests BOTTLES DRAWN AEROBIC ONLY 5CC  Final   Culture  Setup Time   Final    07/27/2014 22:20 [facility]formed at [facility]    Culture   Final    NO GROWTH 5 DAYS [facility]formed at [facility]    Report Status 08/02/2014 FINAL  Final  MRSA PCR Screening     Status: None   Collection Time: 07/27/14  6:51 PM  Result Value Ref Range Status   MRSA by PCR NEGATIVE NEGATIVE Final    Comment:        The GeneXpert MRSA Assay (FDA approved for NASAL specimens only), is one component of a comprehensive MRSA colonization surveillance program. It is not intended to diagnose MRSA infection nor to guide or monitor treatment for MRSA infections.   Culture, blood (routine x 2)     Status: None   Collection Time: 07/27/14  6:55 PM  Result Value Ref Range Status   Specimen Description BLOOD RIGHT ARM  Final   Special Requests BOTTLES DRAWN AEROBIC ONLY 5CC  Final   Culture  Setup Time   Final    07/27/2014 22:23 Performed at [facility]    Culture   Final    NO GROWTH 5 DAYS Performed at [facility]    Report Status 08/02/2014 FINAL  Final  Culture, respiratory (NON-Expectorated)     Status: None   Collection Time: 07/28/14 10:25 AM  Result Value Ref Range Status   Specimen Description TRACHEAL ASPIRATE  Final   Special Requests NONE  Final   Gram  Stain   Final    RARE WBC PRESENT,BOTH PMN AND MONONUCLEAR RARE SQUAMOUS EPITHELIAL CELLS PRESENT RARE GRAM POSITIVE COCCI IN PAIRS IN CLUSTERS RARE YEAST Performed at [facility]    Culture   Final    Non-Pathogenic Oropharyngeal-type Flora Isolated. Performed at [facility]    Report Status 07/30/2014 FINAL  Final     Studies: Ct Coronary Morp W/cta Cor W/score W/ca W/cm &/or Wo/cm  08/02/2014   EXAM: OVER-READ INTERPRETATION  CT CHEST  The following report is an over-read performed by radiologist Dr. Maryelizabeth RowanStewart [facility] on 08/02/2014. This over-read does not include interpretation of cardiac or coronary anatomy or pathology. The coronary CTA interpretation by the cardiologist is attached.  COMPARISON:  CT thorax 06/16/2008  FINDINGS: There is a new irregular lobulated nodule in the right upper lobe measuring 19 x 13 mm (image 29, series 201). Small bilateral pleural effusions. No evidence of mediastinal adenopathy on this noncontrast exam. The supraclavicular nodal stations not well assessed.  Limited view of the upper abdomen is unremarkable. Limited view of the skeleton demonstrates degenerative spurring of the thoracic spine.  Images saccular aneurysm descending thoracic measuring 14 mm in depth on sagittal image 57.  IMPRESSION: 1. New lobular nodule in the right upper lobe is concerning for bronchogenic carcinoma. Recommend FDG PET / CT scan for characterization and staging. 2.   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   Electronically Signed   By: Genevive BiStewart  Edmunds M.D.   On: 08/02/2014 14:44   Ct Angio Chest Aortic Dissect W &/or W/o  08/02/2014   CLINICAL DATA:  Thoracic aortic aneurysm and left-sided chest pain.  EXAM: CT ANGIOGRAPHY CHEST WITH CONTRAST  TECHNIQUE:  Multidetector CT imaging of the chest was performed using the standard protocol during bolus administration of intravenous contrast. Multiplanar CT image reconstructions and MIPs were obtained to evaluate the vascular anatomy.  CONTRAST:  80mL OMNIPAQUE IOHEXOL 350 MG/ML SOLN  COMPARISON:  None.  FINDINGS: At the level of the sinuses of Valsalva, there is some asymmetric dilatation of the left aortic sinus which measures approximately 3 cm in maximal diameter. Maximal aortic diameter at the level of the sinuses is 4.6 cm. Sinotubular junction measures 3.7 cm. The proximal ascending thoracic aorta is not overtly dilated and measures 3.9 cm in greatest caliber. The proximal arch measures 3.5 cm. The distal arch measures 3.3 cm. Focal ulcerated plaque along the left lateral wall of the aortic arch present with depth of 7 mm and ulcer with approximately also 7 mm. Irregular atheromatous plaque is present at the level of the arch. The great vessels are extremely tortuous. The innominate artery shows aneurysmal dilatation up to approximately 2.1 cm in maximum caliber. Calcified plaque at the origin of the left subclavian artery causes approximately 40% stenosis.  The descending thoracic aorta just beyond the arch shows irregular medial outpouching consistent with focal aneurysmal disease with maximal diameter of the aorta of 4.2 cm at this level. The mid descending thoracic aorta is of normal caliber and measures 2.8 cm. The thoracic aorta then becomes dilated in the lower chest and is extremely tortuous. Maximal diameter of the distal descending thoracic aorta is 4.3 cm. A the aorta measures 3.7 cm at the diaphragmatic hiatus.  There is no evidence of aortic dissection, intramural hemorrhage or aortic rupture. The visualized upper abdominal aorta demonstrates eccentric calcified plaque at the origin of the celiac axis causing approximately 50% stenosis.  Review of the MIP images confirms the above findings.  Nonvascular  Findings:  As noted on the over read of the coronary CTA, there is a lobulated central mass in the right upper lobe measure approximately 1.3 x 2.0 x 1.4 cm in greatest dimensions. There is some adjacent nodularity/inferior extension towards the hilum. No enlarged lymph nodes are seen. Lungs show evidence of emphysema. There is left lower lobe atelectasis/consolidation and a small left pleural effusion. No pneumothorax. No significant airway obstruction or narrowing identified. The heart is moderately enlarged. No pericardial fluid is seen. A dialysis catheter is present.  IMPRESSION: 1. Asymmetric dilatation of the left aortic sinus. Maximal aortic diameter at the level of the sinuses of Valsalva is approximately 4.6 cm. 2. Ectasia of the thoracic aorta with maximal diameter of 4.2 cm at the level of the descending thoracic aorta. No aortic dissection is identified. Focal ulcerated plaque is identified at the level of the aortic arch. 3. 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 4. Lobulated central pulmonary mass of the right upper lobe measuring 1.3 x 2.0 x 1.4 cm. This has an appearance most likely consistent with primary lung carcinoma.   Electronically Signed   By: Irish Lack M.D.   On: 08/02/2014 21:00    Scheduled Meds: . allopurinol  100 mg Oral Daily  . amiodarone  200 mg Oral Daily  . antiseptic oral rinse  7 mL Mouth Rinse BID  . arformoterol  15 mcg Nebulization BID  . aspirin  81 mg Oral Daily  . budesonide  0.5 mg Nebulization BID  . calcitRIOL  1 mcg Oral Q M,W,F-HD  . [START ON 08/04/2014] colchicine  0.3 mg Oral Once per day  on Mon Thu  . darbepoetin (ARANESP) injection - DIALYSIS  40 mcg Intravenous Q Wed-HD  . heparin  3,000 Units Dialysis Once in dialysis  . heparin  5,000 Units Subcutaneous 3 times per day  . ipratropium-albuterol  3 mL Nebulization Q6H  . midodrine  10 mg Oral BID WC  . multivitamin  1 tablet Oral QHS  .  pantoprazole  40 mg Oral Daily  . sodium chloride  10-40 mL Intracatheter Q12H  . sodium chloride  3 mL Intravenous BID    Continuous Infusions:    Time spent: 25 minutes  Hollice EspyKRISHNAN,Kriston Pasquarello K  [facility] Pager [phone]. If 7PM-7AM, please contact night-coverage at [email or web address], password [email or web address] 08/03/2014, 2:16 PM  LOS: 7 days

## 2014-08-03 NOTE — Progress Notes (Signed)
Patient ID: [name], female   DOB: [birth year], 80 y.o.   MRN: [id number]    Subjective:  Denies SSCP, palpitations or Dyspnea On dialysis   Objective:  Filed Vitals:   08/03/14 0851 08/03/14 0930 08/03/14 1000 08/03/14 1030  BP: 104/49 100/44 94/46 95/47   Pulse: 75 81 73 72  Temp:      TempSrc:      Resp: 18 20 16 18   Height:      Weight:      SpO2:        Intake/Output from previous day:  Intake/Output Summary (Last 24 hours) at 08/03/14 1052 Last data filed at 08/03/14 0913  Gross per 24 hour  Intake    480 ml  Output      1 ml  Net    479 ml    Physical Exam: Affect appropriate Chronically ill black female  Dialysis catheter right subclavian  HEENT: normal Neck supple with no adenopathy JVP normal no bruits no thyromegaly Lungs clear with no wheezing and good diaphragmatic motion Heart:  S1/S2 SEM  murmur, no rub, gallop or click PMI normal Abdomen: benighn, BS positve, no tenderness, no AAA no bruit.  No HSM or HJR Distal pulses intact with no bruits No edema Neuro non-focal Skin warm and dry No muscular weakness   Lab Results: Basic Metabolic Panel:  Recent Labs  47/82/95 0200 08/02/14 0450 08/03/14 0757  NA 143 141 141  K 4.0 3.5* 3.7  CL 103 101 101  CO2 28 27 26   GLUCOSE 91 86 93  BUN 29* 16 27*  CREATININE 6.26* 4.89* 7.44*  CALCIUM 7.5* 7.5* 8.1*  MG 2.1  --   --   PHOS 3.9  --  4.7*   Liver Function Tests:  Recent Labs  08/02/14 0450 08/03/14 0757  AST 17  --   ALT <5  --   ALKPHOS 41  --   BILITOT 0.4  --   PROT 6.5  --   ALBUMIN 2.6* 2.7*    CBC:  Recent Labs  08/01/14 0200 08/02/14 0450 08/03/14 0757  WBC 8.6 7.0 6.0  NEUTROABS 5.7  --   --   HGB 9.3* 9.1* 9.4*  HCT 31.3* 30.6* 31.7*  MCV 103.3* 100.0 103.3*  PLT 164 136* 169    Imaging: Ct Coronary Morp W/cta Cor W/score W/ca W/cm &/or Wo/cm  08/02/2014   EXAM: OVER-READ INTERPRETATION  CT CHEST  The following report is an over-read performed by  radiologist Dr. Maryelizabeth Rowan [facility] on 08/02/2014. This over-read does not include interpretation of cardiac or coronary anatomy or pathology. The coronary CTA interpretation by the cardiologist is attached.  COMPARISON:  CT thorax 06/16/2008  FINDINGS: There is a new irregular lobulated nodule in the right upper lobe measuring 19 x 13 mm (image 29, series 201). Small bilateral pleural effusions. No evidence of mediastinal adenopathy on this noncontrast exam. The supraclavicular nodal stations not well assessed.  Limited view of the upper abdomen is unremarkable. Limited view of the skeleton demonstrates degenerative spurring of the thoracic spine.  Images saccular aneurysm descending thoracic measuring 14 mm in depth on sagittal image 57.  IMPRESSION: 1. New lobular nodule in the right upper lobe is concerning for bronchogenic carcinoma. Recommend FDG PET / CT scan for characterization and staging. 2.   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   Electronically Signed   By: Genevive BiStewart  Edmunds M.D.   On: 08/02/2014 14:44   Ct Angio Chest Aortic Dissect W &/or W/o  08/02/2014   CLINICAL DATA:  Thoracic aortic aneurysm and left-sided chest pain.  EXAM: CT ANGIOGRAPHY CHEST WITH CONTRAST  TECHNIQUE: Multidetector CT imaging of the chest was performed using the standard protocol during bolus administration of intravenous contrast. Multiplanar CT image reconstructions and MIPs were obtained to evaluate the vascular anatomy.  CONTRAST:  80mL OMNIPAQUE IOHEXOL 350 MG/ML SOLN  COMPARISON:  None.  FINDINGS: At the level of the sinuses of Valsalva, there is some asymmetric dilatation of the left aortic sinus which measures approximately 3 cm in maximal diameter. Maximal aortic diameter at the level of the sinuses is 4.6 cm.  Sinotubular junction measures 3.7 cm. The proximal ascending thoracic aorta is not overtly dilated and measures 3.9 cm in greatest caliber. The proximal arch measures 3.5 cm. The distal arch measures 3.3 cm. Focal ulcerated plaque along the left lateral wall of the aortic arch present with depth of 7 mm and ulcer with approximately also 7 mm. Irregular atheromatous plaque is present at the level of the arch. The great vessels are extremely tortuous. The innominate artery shows aneurysmal dilatation up to approximately 2.1 cm in maximum caliber. Calcified plaque at the origin of the left subclavian artery causes approximately 40% stenosis.  The descending thoracic aorta just beyond the arch shows irregular medial outpouching consistent with focal aneurysmal disease with maximal diameter of the aorta of 4.2 cm at this level. The mid descending thoracic aorta is of normal caliber and measures 2.8 cm. The thoracic aorta then becomes dilated in the lower chest and is extremely tortuous. Maximal diameter of the distal descending thoracic aorta is 4.3 cm. A the aorta measures 3.7 cm at the diaphragmatic hiatus.  There is no evidence of aortic dissection, intramural hemorrhage or aortic rupture. The visualized upper abdominal aorta demonstrates eccentric calcified plaque at the origin of the celiac axis causing approximately 50% stenosis.  Review of the MIP images confirms the above findings.  Nonvascular Findings:  As noted on the over read of the coronary CTA, there is a lobulated central mass in the right upper lobe measure approximately 1.3 x 2.0 x 1.4 cm in greatest dimensions. There is some adjacent nodularity/inferior extension towards the hilum. No enlarged lymph nodes are seen. Lungs show evidence of emphysema. There is left lower lobe atelectasis/consolidation and a small left pleural effusion. No pneumothorax. No significant airway obstruction or narrowing identified. The heart is moderately enlarged. No  pericardial fluid is seen. A dialysis catheter is present.  IMPRESSION: 1. Asymmetric dilatation of the left aortic sinus. Maximal aortic diameter at the level of the sinuses of Valsalva is approximately 4.6 cm. 2. Ectasia of the thoracic aorta with maximal diameter of 4.2 cm at the level of the descending thoracic aorta. No aortic dissection is identified. Focal ulcerated plaque is identified at the level of the aortic arch. 3. 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 4. Lobulated central pulmonary mass of the right upper lobe measuring 1.3 x 2.0 x 1.4 cm. This has an appearance most likely consistent with primary lung carcinoma.   Electronically Signed   By: Irish LackGlenn  Yamagata M.D.   On: 08/02/2014 21:00    Cardiac Studies:  ECG:    Telemetry:  NSR no arrhythmia   Echo: - There is highly echogenic structure in  the sinotubular junction measuring 7 x 5 mm and aneurysmal appearing ascending thoracic aorta. There is also moderate aortic regurgitation. Aortic dissection can&'t be excluded and further evaluation with with chest CTA is recommended.  There appears to be a mobile mass attached to the interatrial septum measuring 13 x 17 mm. This is possibly due to hypermobile interatrial septum, but further evaluation with CT is recommended.  Medications:   . allopurinol  100 mg Oral Daily  . amiodarone  200 mg Oral Daily  . antiseptic oral rinse  7 mL Mouth Rinse BID  . arformoterol  15 mcg Nebulization BID  . aspirin  81 mg Oral Daily  . budesonide  0.5 mg Nebulization BID  . calcitRIOL  1 mcg Oral Q M,W,F-HD  . [START ON 08/04/2014] colchicine  0.3 mg Oral Once per day on Mon Thu  . darbepoetin (ARANESP) injection - DIALYSIS  40 mcg Intravenous Q Wed-HD  . heparin  3,000 Units Dialysis Once in dialysis  . heparin  5,000 Units Subcutaneous 3 times per day  . ipratropium-albuterol  3 mL Nebulization Q6H  . midodrine  10 mg Oral  BID WC  . multivitamin  1 tablet Oral QHS  . pantoprazole  40 mg Oral Daily  . sodium chloride  10-40 mL Intracatheter Q12H  . sodium chloride  3 mL Intravenous BID       Assessment/Plan:  Abnormal Echo:  CT;s and echo reviewed No LA mass and CT with aortic debris consist ant with age mild dilatation of left coronary sinus no intramural hematoma, or dissection Valve disease:  Moderate AR in setting of AV sclerosis and mild MR medical Rx BP soft and already on midodrine for dialysis  Lung cancer:  New diagnosis ? PET scan will need outpatient pulmonary f/u   Will sign off   Charlton Hawseter Nishan 08/03/2014, 10:52 AM

## 2014-08-03 NOTE — Progress Notes (Addendum)
CTA did not show dissection or mass. May need vascular f/u for ectasia of descending thoracic aorta (and other vascular dz noted in study). However, CT also incidentally did show a possible lung carcinoma. IM aware. Will s/o - please call with questions.  Jenilyn Magana PA-C

## 2014-08-04 MED ORDER — IPRATROPIUM-ALBUTEROL 0.5-2.5 (3) MG/3ML IN SOLN: 3.0000 mL | Freq: Four times a day (QID) | RESPIRATORY_TRACT | Status: DC | PRN

## 2014-08-04 NOTE — Progress Notes (Signed)
PROGRESS NOTE  [name] ZOX:[id number] DOB: 08/12/1929 DOA: 07/27/2014 PCP: [name], MD  HPI/Recap of past 24 hours: 80 year female with past medical history atrial fibrillation, end-stage renal disease on hemodialysis and COPD transferred from [facility] on 11/4 for acute encephalopathy. Prior to admission there, patient had reported increasing weakness and decreased appetite for several weeks and then confusion for several days prior to admission. Had been recently started on Remeron 3 weeks prior. Also noted to have urinary tract infection at outside hospital. Following admission, patient developed acute respiratory failure and was intubated on 11/5 through 11/7 with cause felt to be left lower lung pneumonia. Patient also required pressors. She is able to be successfully resuscitated and extubated and transferred to the hospitalist service on 11/9.  Since that time, patient has continued to improve. Encephalopathy has become more more minimal. Patient had an echocardiogram done on 11/7 when assessing her respiratory failure which noted a questionable aneurysmal area or mass. Cardiology consult and patient underwent CT angiogram of chest done 11/11 which ruled out both. She has continued to receive dialysis as per nephrology. In workup for her respiratory status, patient underwent CT angiogram of the chest to rule out pulmonary embolus. No pulmonary embolus noted, however a Lobulated central pulmonary mass of the right upper lobe measuring 1.3 x 2.0 x 1.4 cm. This has an appearance most likely consistent with primary lung carcinoma, which has not been yet discussed with the patient.  Patient seen today in her room. States she is feeling okay. Denies any shortness of breath.  Assessment/Plan: Principal Problem:   Acuteon chronic hypercarbic respiratory failure: Felt to be secondary to COPD and left lower lobe pneumonia, resolved with stable oxygenation. Currently on minimal oxygen.  Antibiotic course complete Active Problems:   Essential hypertension: Stable. Controlling hypotension postdialysis with midodrine    Encephalopathy acute:felt to be secondary to urinary tract infection: Urine culture unrevealing, course of cefepime complete    ESRD on dialysis: Being seen by nephrology   A-fib: Rate controlled, currently normal sinus rhythm, on amiodarone, not on anticoagulation    Abnormal echocardiogram: Confirmed the TEE no evidence of aneurysm or mass   Lung mass: Highly suspicious suspicious for bronchogenic carcinoma. I discussed this with the patient and her granddaughter today. The granddaughter would like to discuss this with her mother. I spoke with pulmonary who in review of the CT feels it may be possible to get biopsy through bronchoscopy, but noted that the lesion was slow-growing. In addition, given patient's frailty, other comorbidities including end-stage renal disease-? Would this lead to offering of treatment other than possible radiation, which patient may not want to receive. Granddaughter and mother are planning to discuss this further and will come to some decisions this afternoon.  Code Status: full code  Family Communication: discussed with granddaughter at the bedside today  Disposition Plan: if no further workup planned, discharged to skilled nursing tomorrow 11/13  Consultants:  Nephrology  Pulmonary  Cardiology  Procedures:  11/7 - TTE - normal systolic fxn - EF 60-65% - no WMA - grade 1 DD - highly echogenic structure in the sinotubular junction measuring 7 x 5 mm and aneurysmal appearing ascending thoracic aorta. There is also moderate aortic regurgitation. Aortic dissection can't be excluded and further evaluation with chest CTA is recommended. There appears to be a mobile mass attached to the interatrial septum measuring 13 x 17 mm. This is possibly due to hypermobile interatrial septum, but further evaluation with CT is  recommended.  Antibiotics:  IV cefepime 11/4-11/11  IV vancomycin-discontinued   Objective: BP 97/49 mmHg  Pulse 81  Temp(Src) 98.9 F (37.2 C) (Oral)  Resp 22  Ht 5\' 5"  (1.651 m)  Wt 64 kg (141 lb 1.5 oz)  BMI 23.48 kg/m2  SpO2 92%  Intake/Output Summary (Last 24 hours) at 08/04/14 1216 Last data filed at 08/04/14 0917  Gross per 24 hour  Intake    360 ml  Output   1345 ml  Net   -985 ml   Filed Weights   08/03/14 0838 08/03/14 1223 08/03/14 2050  Weight: 65.1 kg (143 lb 8.3 oz) 63.8 kg (140 lb 10.5 oz) 64 kg (141 lb 1.5 oz)    Exam: Unchanged from previous day  General:  Alert and oriented 2, no acute distress  Cardiovascular: regular rate and rhythm, S1-S2  Respiratory: clear to auscultation bilaterally  Abdomen: soft, nontender, nondistended, positive bowel sounds  Musculoskeletal: no clubbing or cyanosis or edema   Data Reviewed: Basic Metabolic Panel:  Recent Labs Lab 07/30/14 0409 07/31/14 0510 08/01/14 0200 08/02/14 0450 08/03/14 0757  NA 138 143 143 141 141  K 3.7 3.8 4.0 3.5* 3.7  CL 99 103 103 101 101  CO2 24 28 28 27 26   GLUCOSE 84 66* 91 86 93  BUN 19 17 29* 16 27*  CREATININE 5.11* 4.40* 6.26* 4.89* 7.44*  CALCIUM 7.0* 7.3* 7.5* 7.5* 8.1*  MG 1.9 1.9 2.1  --   --   PHOS 2.9 3.3 3.9  --  4.7*   Liver Function Tests:  Recent Labs Lab 07/29/14 0455 08/02/14 0450 08/03/14 0757  AST 19 17  --   ALT <5 <5  --   ALKPHOS 36* 41  --   BILITOT 0.4 0.4  --   PROT 5.8* 6.5  --   ALBUMIN 2.4* 2.6* 2.7*   No results for input(s): LIPASE, AMYLASE in the last 168 hours. No results for input(s): AMMONIA in the last 168 hours. CBC:  Recent Labs Lab 07/30/14 0409 07/31/14 0510 08/01/14 0200 08/02/14 0450 08/03/14 0757  WBC 7.7 8.9 8.6 7.0 6.0  NEUTROABS 5.5 6.9 5.7  --   --   HGB 9.4* 9.2* 9.3* 9.1* 9.4*  HCT 30.6* 30.2* 31.3* 30.6* 31.7*  MCV 96.8 99.7 103.3* 100.0 103.3*  PLT 166 140* 164 136* 169   Cardiac Enzymes:     Recent Labs Lab 07/29/14 0455  TROPONINI <0.30   BNP (last 3 results) No results for input(s): PROBNP in the last 8760 hours. CBG:  Recent Labs Lab 07/29/14 1139 07/29/14 1628 07/29/14 2012 07/29/14 2312 07/30/14 0438  GLUCAP 103* 106* 98 87 86    Recent Results (from the past 240 hour(s))  Culture, Urine     Status: None   Collection Time: 07/27/14  6:23 PM  Result Value Ref Range Status   Specimen Description URINE, RANDOM  Final   Special Requests NONE  Final   Culture  Setup Time   Final    07/27/2014 22:15 Performed at [facility]    Colony Count NO GROWTH Performed at [facility]   Final   Culture NO GROWTH Performed at [facility]   Final   Report Status 07/28/2014 FINAL  Final  Culture, blood (routine x 2)     Status: None   Collection Time: 07/27/14  6:50 PM  Result Value Ref Range Status   Specimen Description BLOOD RIGHT ARM  Final   Special Requests  BOTTLES DRAWN AEROBIC ONLY 5CC  Final   Culture  Setup Time   Final    07/27/2014 22:20 Performed at [facility]    Culture   Final    NO GROWTH 5 DAYS Performed at [facility]    Report Status 08/02/2014 FINAL  Final  MRSA PCR Screening     Status: None   Collection Time: 07/27/14  6:51 PM  Result Value Ref Range Status   MRSA by PCR NEGATIVE NEGATIVE Final    Comment:        The GeneXpert MRSA Assay (FDA approved for NASAL specimens only), is one component of a comprehensive MRSA colonization surveillance program. It is not intended to diagnose MRSA infection nor to guide or monitor treatment for MRSA infections.   Culture, blood (routine x 2)     Status: None   Collection Time: 07/27/14  6:55 PM  Result Value Ref Range Status   Specimen Description BLOOD RIGHT ARM  Final   Special Requests BOTTLES DRAWN AEROBIC ONLY 5CC  Final   Culture  Setup Time   Final    07/27/2014 22:23 Performed at [facility]    Culture   Final    NO  GROWTH 5 DAYS Performed at [facility]    Report Status 08/02/2014 FINAL  Final  Culture, respiratory (NON-Expectorated)     Status: None   Collection Time: 07/28/14 10:25 AM  Result Value Ref Range Status   Specimen Description TRACHEAL ASPIRATE  Final   Special Requests NONE  Final   Gram Stain   Final    RARE WBC PRESENT,BOTH PMN AND MONONUCLEAR RARE SQUAMOUS EPITHELIAL CELLS PRESENT RARE GRAM POSITIVE COCCI IN PAIRS IN CLUSTERS RARE YEAST Performed at [facility]    Culture   Final    Non-Pathogenic Oropharyngeal-type Flora Isolated. Performed at [facility]    Report Status 07/30/2014 FINAL  Final     Studies: No results found.  Scheduled Meds: . allopurinol  100 mg Oral Daily  . amiodarone  200 mg Oral Daily  . antiseptic oral rinse  7 mL Mouth Rinse BID  . arformoterol  15 mcg Nebulization BID  . aspirin  81 mg Oral Daily  . budesonide  0.5 mg Nebulization BID  . calcitRIOL  1 mcg Oral Q M,W,F-HD  . colchicine  0.3 mg Oral Once per day on Mon Thu  . darbepoetin (ARANESP) injection - DIALYSIS  40 mcg Intravenous Q Wed-HD  . heparin  3,000 Units Dialysis Once in dialysis  . heparin  5,000 Units Subcutaneous 3 times per day  . midodrine  10 mg Oral BID WC  . multivitamin  1 tablet Oral QHS  . pantoprazole  40 mg Oral Daily  . sodium chloride  10-40 mL Intracatheter Q12H  . sodium chloride  3 mL Intravenous BID    Continuous Infusions:    Time spent: 25 minutes  Hollice EspyKRISHNAN,Jarad Barth K  [facility] Pager [phone]. If 7PM-7AM, please contact night-coverage at [email or web address], password [email or web address] 08/04/2014, 12:16 PM  LOS: 8 days

## 2014-08-04 NOTE — Clinical Documentation Improvement (Signed)
"  Left Lower Lobe Pneumonia" documented in current medical record.  Please clarify if the LLL PNA was:   - Present on Admission   - not Present on Admission, and developed during the hospital stay   - Unable to Clinically Determine   Thank You, Jerral Ralphathy R Raschelle Wisenbaker ,RN Clinical Documentation Specialist:  [phone] [facility]

## 2014-08-04 NOTE — Progress Notes (Signed)
Subjective:  No cos in room with grandaughter  Objective Vital signs in last 24 hours: Filed Vitals:   08/03/14 1733 08/03/14 1952 08/03/14 2050 08/04/14 0542  BP: 104/47  115/43 108/49  Pulse: 120  74 75  Temp: 98.9 F (37.2 C)  98.4 F (36.9 C) 98.2 F (36.8 C)  TempSrc: Oral  Oral Oral  Resp: 18  19 19   Height:   5\' 5"  (1.651 m)   Weight:   64 kg (141 lb 1.5 oz)   SpO2: 92% 92% 97% 95%   Weight change: -0.6 kg (-1 lb 5.2 oz)  Physical Exam: General: Alert , NAD,  OX4 Heart: RRR Lungs: Decr  bs at bila bases Abdomen: BS +, soft, NT, ND Extremities: No pedal edema Dialysis Access: R IJ perm cath   HD: MWF [facility] 3.5h 69kg 2/2.25 BAth Heparin 3000 R IJ cath Aranesp 25 ug every 2 weeks, Calcitriol 1.0 ug TIW pth 991, Ca 7.4, P 4.5    Problem/Plan: 1.  Resp. Failure sec to PNA, Pulm . Edema, Copd,(  R Upper lung Mass RX per Admit team) Has home O2 2. ESRD - MWF ( ash op mwf) continue on schedule  hd 3. Anemia - HGB 9.4 on aranesp 40 q wed hd 4. Secondary hyperparathyroidism - on po calcitriol qhd  Phos 4.7  No binder/ ca okay 5. HTN/volume - Midodrine 10mg   BID WC bp 108/49 1800 cc uf yesterday on hd  Below edw now 64 kg ( op edw was 69)  Lenny Pastelavid Zeyfang, PA-C [facility] Beeper [phone] 08/04/2014,9:15 AM  LOS: 8 days  I have seen and examined this patient and agree with plan per Lenny Pastelavid Zeyfang. Pt ready to go to rehab but pulm to see to address lung nodule.  If not DC'd today then will plan HD in AM. Roma Bondar T,MD 08/04/2014 9:51 AM Labs: Basic Metabolic Panel:  Recent Labs Lab 07/31/14 0510 08/01/14 0200 08/02/14 0450 08/03/14 0757  NA 143 143 141 141  K 3.8 4.0 3.5* 3.7  CL 103 103 101 101  CO2 28 28 27 26   GLUCOSE 66* 91 86 93  BUN 17 29* 16 27*  CREATININE 4.40* 6.26* 4.89* 7.44*  CALCIUM 7.3* 7.5* 7.5* 8.1*  PHOS 3.3 3.9  --  4.7*   Liver Function Tests:  Recent Labs Lab 07/29/14 0455 08/02/14 0450 08/03/14 0757   AST 19 17  --   ALT <5 <5  --   ALKPHOS 36* 41  --   BILITOT 0.4 0.4  --   PROT 5.8* 6.5  --   ALBUMIN 2.4* 2.6* 2.7*   CBC:  Recent Labs Lab 07/30/14 0409 07/31/14 0510 08/01/14 0200 08/02/14 0450 08/03/14 0757  WBC 7.7 8.9 8.6 7.0 6.0  NEUTROABS 5.5 6.9 5.7  --   --   HGB 9.4* 9.2* 9.3* 9.1* 9.4*  HCT 30.6* 30.2* 31.3* 30.6* 31.7*  MCV 96.8 99.7 103.3* 100.0 103.3*  PLT 166 140* 164 136* 169   Cardiac Enzymes:  Recent Labs Lab 07/29/14 0455  TROPONINI <0.30   CBG:  Recent Labs Lab 07/29/14 1139 07/29/14 1628 07/29/14 2012 07/29/14 2312 07/30/14 0438  GLUCAP 103* 106* 98 87 86    Studies/Results: Ct Coronary Morp W/cta Cor W/score W/ca W/cm &/or Wo/cm  08/03/2014   ADDENDUM REPORT: 08/03/2014 18:54  CLINICAL DATA:  Abnormal echocardiogram suspicious for calcified lesion in the ascending thoracic aorta and in the left atrium. Rule out dissection and left atrial mass.  EXAM:  Cardiac/Coronary  CT  TECHNIQUE: The patient was scanned on a Philips 256 scanner.  FINDINGS: A 120 kV prospective scan was triggered in the descending thoracic aorta at 111 HU's. Axial non-contrast 3mm slices were carried out through the heart. The data set was analyzed on a dedicated work station and scored using the Agatson method. Gantry rotation speed was 270 msecs and collimation was .9 mm. No beta blockade and 0.4 mg of sl NTG was given. The 3D data set was reconstructed in 5% intervals of the 67-82 % of the R-R cycle. Diastolic phases were analyzed on a dedicated work station using MPR, MIP and VRT modes. The patient received 80 cc of contrast.  Aorta: Heavily diffusely calcified with aneurysmal arch and tortuous and aneurysmal descending thoracic aorta with maximum diameter 40 x 33 mm. Severe atherosclerotic plaque. No dissection.  Dilated aortic root with dimensions of the leaflets: Right: 38 mm, Left: 41 mm, Non-coronary 35 mm  Sinotubular junction:  37 x 34 mm  Ascending aorta:  37 x 36  mm  Aortic arch:  Aneurysmal measuring 33 x 28 mm  Descending thoracic aorta:  Aortic Valve:  Trileaflet, no calcifications.  Coronary Arteries: Originating in normal position. Right dominance.  RCA is a large dominant vessel that gives rise to PDA and PLVB. There is only minimal non-calcified plaque.  Left main is a large caliber, short vessel that has distal calcified plaque with positive remodeling associated with only 0-25% stenosis.  LAD is a moderate caliber long vessel that gives rise to one small diagonal branch. There is only minimal calcified plaque in mid LAD associated with 0-25% stenosis.  LCX is moderate caliber non-dominant vessel that is diffusely calcified. There is mild calcified plaque in the proximal portion associated with 25-50% stenosis and moderate circumferential plague in mid LAD associated with 50-70% stenosis. Distal LCX has moderate calcified plaque. OM is diffusely calcified with associated stenosis 25-50%.  Left atrium:  No mass seen.  IMPRESSION: 1. Coronary calcium score of 996. This was 42 percentile for age and sex matched control.  2.  Normal coronary origin.  Right dominance.  3. Moderate CAD with moderate diffuse non-obstructive calcified plaque predominantly in non-dominant LCX. An aggressive medical management is recommended.  4. No evidence of aortic dissection. Heavily calcified thoracic aorta with dilated root, and aneurysmal aortic arch and descending aorta.  5. No evidence of left atrial mass. The lesion seen on echocardiogram most probably represents hypermobile septum.  Tobias Alexander   Electronically Signed   By: Tobias Alexander   On: 08/03/2014 18:54   08/03/2014   EXAM: OVER-READ INTERPRETATION  CT CHEST  The following report is an over-read performed by radiologist Dr. Maryelizabeth Row[facility] on 08/02/2014. This over-read does not include interpretation of cardiac or coronary anatomy or pathology. The coronary CTA interpretation by the  cardiologist is attached.  COMPARISON:  CT thorax 06/16/2008  FINDINGS: There is a new irregular lobulated nodule in the right upper lobe measuring 19 x 13 mm (image 29, series 201). Small bilateral pleural effusions. No evidence of mediastinal adenopathy on this noncontrast exam. The supraclavicular nodal stations not well assessed.  Limited view of the upper abdomen is unremarkable. Limited view of the skeleton demonstrates degenerative spurring of the thoracic spine.  Images saccular aneurysm descending thoracic measuring 14 mm in depth on sagittal image 57.  IMPRESSION: 1. New lobular nodule in the right upper lobe is concerning for bronchogenic carcinoma. Recommend FDG PET / CT scan  for characterization and staging. 2.   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  Electronically Signed: By: Stewart  EdGenevive Bimunds M.D. On: 08/02/2014 14:44   Ct Angio Chest Aortic Dissect W &/or W/o  08/02/2014   CLINICAL DATA:  Thoracic aortic aneurysm and left-sided chest pain.  EXAM: CT ANGIOGRAPHY CHEST WITH CONTRAST  TECHNIQUE: Multidetector CT imaging of the chest was performed using the standard protocol during bolus administration of intravenous contrast. Multiplanar CT image reconstructions and MIPs were obtained to evaluate the vascular anatomy.  CONTRAST:  80mL OMNIPAQUE IOHEXOL 350 MG/ML SOLN  COMPARISON:  None.  FINDINGS: At the level of the sinuses of Valsalva, there is some asymmetric dilatation of the left aortic sinus which measures approximately 3 cm in maximal diameter. Maximal aortic diameter at the level of the sinuses is 4.6 cm. Sinotubular junction measures 3.7 cm. The proximal ascending thoracic aorta is not overtly dilated and measures 3.9 cm in greatest caliber. The proximal arch measures 3.5 cm. The distal arch measures 3.3 cm. Focal  ulcerated plaque along the left lateral wall of the aortic arch present with depth of 7 mm and ulcer with approximately also 7 mm. Irregular atheromatous plaque is present at the level of the arch. The great vessels are extremely tortuous. The innominate artery shows aneurysmal dilatation up to approximately 2.1 cm in maximum caliber. Calcified plaque at the origin of the left subclavian artery causes approximately 40% stenosis.  The descending thoracic aorta just beyond the arch shows irregular medial outpouching consistent with focal aneurysmal disease with maximal diameter of the aorta of 4.2 cm at this level. The mid descending thoracic aorta is of normal caliber and measures 2.8 cm. The thoracic aorta then becomes dilated in the lower chest and is extremely tortuous. Maximal diameter of the distal descending thoracic aorta is 4.3 cm. A the aorta measures 3.7 cm at the diaphragmatic hiatus.  There is no evidence of aortic dissection, intramural hemorrhage or aortic rupture. The visualized upper abdominal aorta demonstrates eccentric calcified plaque at the origin of the celiac axis causing approximately 50% stenosis.  Review of the MIP images confirms the above findings.  Nonvascular Findings:  As noted on the over read of the coronary CTA, there is a lobulated central mass in the right upper lobe measure approximately 1.3 x 2.0 x 1.4 cm in greatest dimensions. There is some adjacent nodularity/inferior extension towards the hilum. No enlarged lymph nodes are seen. Lungs show evidence of emphysema. There is left lower lobe atelectasis/consolidation and a small left pleural effusion. No pneumothorax. No significant airway obstruction or narrowing identified. The heart is moderately enlarged. No pericardial fluid is seen. A dialysis catheter is present.  IMPRESSION: 1. Asymmetric dilatation of the left aortic sinus. Maximal aortic diameter at the level of the sinuses of Valsalva is approximately 4.6 cm. 2. Ectasia  of the thoracic aorta with maximal diameter of 4.2 cm at the level of the descending thoracic aorta. No aortic dissection is identified. Focal ulcerated plaque is identified at the level of the aortic arch. 3. 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 4. Lobulated central pulmonary mass of the right upper lobe measuring 1.3 x 2.0 x 1.4 cm. This has an appearance most likely consistent with primary lung carcinoma.   Electronically Signed   By: Rudene AndaGlenn  Yamagata M.D.  On: 08/02/2014 21:00   Medications:   . allopurinol  100 mg Oral Daily  . amiodarone  200 mg Oral Daily  . antiseptic oral rinse  7 mL Mouth Rinse BID  . arformoterol  15 mcg Nebulization BID  . aspirin  81 mg Oral Daily  . budesonide  0.5 mg Nebulization BID  . calcitRIOL  1 mcg Oral Q M,W,F-HD  . colchicine  0.3 mg Oral Once per day on Mon Thu  . darbepoetin (ARANESP) injection - DIALYSIS  40 mcg Intravenous Q Wed-HD  . heparin  3,000 Units Dialysis Once in dialysis  . heparin  5,000 Units Subcutaneous 3 times per day  . midodrine  10 mg Oral BID WC  . multivitamin  1 tablet Oral QHS  . pantoprazole  40 mg Oral Daily  . sodium chloride  10-40 mL Intracatheter Q12H  . sodium chloride  3 mL Intravenous BID

## 2014-08-04 NOTE — Care Management Note (Signed)
CARE MANAGEMENT NOTE 08/04/2014  [facility]   Account Number:  [id number]  Date Initiated:  07/29/2014  Documentation initiated by:  [facility]  Subjective/Objective Assessment:   Admitted with AMS and UTI - progressed to resp failure and was intubated.     Action/Plan:   11/12/201  Chart reviewed noted that pt is very weak and plan to d/c to SNF, however this pt must sit and be able to travel the outpatient HD as well as sit for HD prior to d/c. Will continue to follow   Anticipated DC Date:  08/05/2014   Anticipated DC Plan:  SKILLED NURSING FACILITY      DC Planning Services  CM consult      Choice offered to / List presented to:             Status of service:  In process, will continue to follow Medicare Important Message given?  YES (If response is "NO", the following Medicare IM given date fields will be blank) Date Medicare IM given:  08/01/2014 Medicare IM given by:  [facility] Date Additional Medicare IM given:   Additional Medicare IM given by:    Discharge Disposition:    Per UR Regulation:  Reviewed for med. necessity/level of care/duration of stay  If discussed at Long Length of Stay Meetings, dates discussed:    Comments:  Contact:  Flower,[name] Daughter [phone]     Lyndal PulleyHill,Debra Grandaughter [phone]

## 2014-08-04 NOTE — Plan of Care (Signed)
Problem: Phase I Progression Outcomes Goal: Up in chair Outcome: Completed/Met Date Met:  08/04/14 Goal: Hemodynamically stable Outcome: Completed/Met Date Met:  08/04/14  Problem: Phase II Progression Outcomes Goal: Tolerating diet Outcome: Completed/Met Date Met:  08/04/14

## 2014-08-04 NOTE — Progress Notes (Signed)
Paged Dr. Rito EhrlichKrishnan pt last bm 11/09, pt on senna at home, may she have something for constipation.

## 2014-08-05 DIAGNOSIS — Z720 Tobacco use: Secondary | ICD-10-CM | POA: Diagnosis present

## 2014-08-05 LAB — CBC
HCT: 32.3 % — ABNORMAL LOW (ref 36.0–46.0)
Hemoglobin: 9.5 g/dL — ABNORMAL LOW (ref 12.0–15.0)
MCH: 29.8 pg (ref 26.0–34.0)
MCHC: 29.4 g/dL — AB (ref 30.0–36.0)
MCV: 101.3 fL — ABNORMAL HIGH (ref 78.0–100.0)
PLATELETS: 193 10*3/uL (ref 150–400)
RBC: 3.19 MIL/uL — ABNORMAL LOW (ref 3.87–5.11)
RDW: 15.1 % (ref 11.5–15.5)
WBC: 7.5 10*3/uL (ref 4.0–10.5)

## 2014-08-05 LAB — BASIC METABOLIC PANEL
ANION GAP: 15 (ref 5–15)
BUN: 33 mg/dL — ABNORMAL HIGH (ref 6–23)
CALCIUM: 8.7 mg/dL (ref 8.4–10.5)
CO2: 26 meq/L (ref 19–32)
Chloride: 103 mEq/L (ref 96–112)
Creatinine, Ser: 7.81 mg/dL — ABNORMAL HIGH (ref 0.50–1.10)
GFR calc Af Amer: 5 mL/min — ABNORMAL LOW (ref >90)
GFR, EST NON AFRICAN AMERICAN: 4 mL/min — AB (ref >90)
Glucose, Bld: 88 mg/dL (ref 70–99)
Potassium: 4.4 mEq/L (ref 3.7–5.3)
SODIUM: 144 meq/L (ref 137–147)

## 2014-08-05 MED ORDER — MIDODRINE HCL 10 MG PO TABS: 10.0000 mg | ORAL_TABLET | Freq: Two times a day (BID) | ORAL | Status: AC

## 2014-08-05 MED ORDER — MIDODRINE HCL 5 MG PO TABS
ORAL_TABLET | ORAL | Status: AC
  Filled 2014-08-05: qty 2

## 2014-08-05 MED ORDER — AMIODARONE HCL 200 MG PO TABS: 200.0000 mg | ORAL_TABLET | Freq: Every day | ORAL | Status: AC

## 2014-08-05 MED ORDER — SORBITOL 70 % SOLN: 30.0000 mL | Freq: Every day | Status: DC | PRN

## 2014-08-05 MED ORDER — COLCHICINE 0.6 MG PO TABS: 0.3000 mg | ORAL_TABLET | ORAL | Status: DC

## 2014-08-05 NOTE — Discharge Summary (Signed)
Discharge Summary  [name] ZOX:[id number] DOB: [birth year]  PCP: [name], MD  Admit date: 07/27/2014 Discharge date: 08/05/2014  Time spent: 25 minutes  Recommendations for Outpatient Follow-up:  1. New medication: midodrine 10 mgby mouth twice a day 2. Medication change: colchicine now at 0.3 mg by mouth weekly (renal adjustment) 3. Follow-up appointment: Patient to have repeat CT chest with contrast in 3 months to follow-up on right upper lobe lung mass 4. With patient's volumes being managed with dialysis and episodes of hypotension after dialysis, patient's antihypertensives are being discontinued  Discharge Diagnoses:  Active Hospital Problems   Diagnosis Date Noted  . Acute respiratory failure 07/28/2014  . Lung mass 08/03/2014  . Abnormal echocardiogram 08/02/2014  . Acute respiratory failure with hypercapnia 07/28/2014  . Encephalopathy acute 07/27/2014  . ESRD on dialysis 07/27/2014  . A-fib 07/27/2014  . COPD (chronic obstructive pulmonary disease) 07/27/2014  . Chronic respiratory failure 07/27/2014  . UTI (urinary tract infection) 07/27/2014  . Essential hypertension 07/12/2008    Resolved Hospital Problems   Diagnosis Date Noted Date Resolved  No resolved problems to display.    Discharge Condition: improved, being discharged to skilled nursing  Diet recommendation: low-sodium  Filed Weights   08/03/14 2050 08/04/14 2111 08/05/14 0707  Weight: 64 kg (141 lb 1.5 oz) 67.5 kg (148 lb 13 oz) 63.3 kg (139 lb 8.8 oz)    History of present illness:  81 year female with past medical history atrial fibrillation, end-stage renal disease on hemodialysis and COPD transferred from [facility] on 11/4 for acute encephalopathy. Prior to admission there, patient had reported increasing weakness and decreased appetite for several weeks and then confusion for several days prior to admission. Had been recently started on Remeron 3 weeks prior. Also noted to  have urinary tract infection at outside hospital.  Hospital Course:  Following admission, patient developed acute respiratory failure and was intubated on 11/5 through 11/7 with cause felt to be left lower lung pneumonia, present since admission. Patient also required pressors. She is able to be successfully resuscitated and extubated and transferred to the hospitalist service on 11/9.  Since that time, patient has continued to improve. Encephalopathy has become more more minimal. Patient had an echocardiogram done on 11/7 when assessing her respiratory failure which noted a questionable aneurysmal area or mass. Cardiology consult and patient underwent CT angiogram of chest done 11/11 which ruled out both. She has continued to receive dialysis as per nephrology. In workup for her respiratory status, patient underwent CT angiogram of the chest to rule out pulmonary embolus. No pulmonary embolus noted, however a Lobulated central pulmonary mass of the right upper lobe measuring 1.3 x 2.0 x 1.4 cm.  Spoke with pulmonary who suspected lesion was slow-growing. In addition, given patient's frailty and other comorbidities including end-stage renal disease, they questioned whether invasive bronchoscopy and biopsy would be of much yield given that it would lead to limited treatment options-at most some palliative radiation which patient may not want to receive. After presenting these issues to the patient, her daughter and her granddaughter, they opted for the plan to repeat a CT scan in 3 months and at that time make a reassessment.  Principal Problem:   Acute on chronic respiratory failurefrom COPD: Secondary to volume overload and left lower lobe pneumonia plus COPD, resolved with improved oxygenation, treatment of infection and volume management  Active Problems:   Essential hypertension: Antihypertensives being discontinued    Encephalopathy acute: Resolved with treatment of infection  and improvement of  respiratory status    ESRD on dialysis: managed by nephrology, receiving dialysis on day of discharge, next session scheduled for Monday 11/16.     A-fib: Now on amiodarone. Currently normal sinus rhythm, not on anticoagulation  Tobacco abuse: Patient had quit for a while then resumed. Counseled to quit    UTI (urinary tract infection): Urine culture ended up being unrevealing. Course of cefepime for pneumonia and urinary tract infection completed for discharge    Acute respiratory failure with hypercapnia: Resolved   Abnormal echocardiogram: As above, CT of chest confirmed no evidence for aneurysm or cardiac mass    Lung mass: As above, repeat CT in 3 months   Procedures:  11/7 - TTE - normal systolic fxn - EF 60-65% - no WMA - grade 1 DD - highly echogenic structure in the sinotubular junction measuring 7 x 5 mm and aneurysmal appearing ascending thoracic aorta. There is also moderate aortic regurgitation. Aortic dissection can't be excluded and further evaluation with chest CTA is recommended. There appears to be a mobile mass attached to the interatrial septum measuring 13 x 17 mm. This is possibly due to hypermobile interatrial septum, but further evaluation with CT is recommended.  Consultations:  Nephrology  Pulmonary  Cardiology  Discharge Exam: BP 100/42 mmHg  Pulse 75  Temp(Src) 98.3 F (36.8 C) (Oral)  Resp 19  Ht 5\' 5"  (1.651 m)  Wt 63.3 kg (139 lb 8.8 oz)  BMI 23.22 kg/m2  SpO2 100%  General: alert and oriented 3, no acute distress Cardiovascular: regular rate and rhythm, S1-S2 Respiratory: clear to auscultation bilaterally  Discharge Instructions You were cared for by a hospitalist during your hospital stay. If you have any questions about your discharge medications or the care you received while you were in the hospital after you are discharged, you can call the unit and asked to speak with the hospitalist on call if the hospitalist that took care of you  is not available. Once you are discharged, your primary care physician will handle any further medical issues. Please note that NO REFILLS for any discharge medications will be authorized once you are discharged, as it is imperative that you return to your primary care physician (or establish a relationship with a primary care physician if you do not have one) for your aftercare needs so that they can reassess your need for medications and monitor your lab values.  Discharge Instructions    Diet - low sodium heart healthy    Complete by:  As directed      Increase activity slowly    Complete by:  As directed             Medication List    STOP taking these medications        BYSTOLIC 5 MG tablet  Generic drug:  nebivolol     furosemide 20 MG tablet  Commonly known as:  LASIX     potassium chloride SA 20 MEQ tablet  Commonly known as:  K-DUR,KLOR-CON     quinapril-hydrochlorothiazide 20-12.5 MG per tablet  Commonly known as:  ACCURETIC     verapamil 40 MG tablet  Commonly known as:  CALAN      TAKE these medications        albuterol (2.5 MG/3ML) 0.083% nebulizer solution  Commonly known as:  PROVENTIL  Take 2.5 mg by nebulization every 6 (six) hours as needed.     allopurinol 100 MG tablet  Commonly known as:  ZYLOPRIM  Take 100 mg by mouth daily.     amiodarone 200 MG tablet  Commonly known as:  PACERONE  Take 1 tablet (200 mg total) by mouth daily.     arformoterol 15 MCG/2ML Nebu  Commonly known as:  BROVANA  Take 15 mcg by nebulization 2 (two) times daily.     aspirin 81 MG tablet  Take 81 mg by mouth daily.     atorvastatin 20 MG tablet  Commonly known as:  LIPITOR  Take 20 mg by mouth daily.     budesonide 0.5 MG/2ML nebulizer solution  Commonly known as:  PULMICORT  Take 0.5 mg by nebulization 2 (two) times daily.     clopidogrel 75 MG tablet  Commonly known as:  PLAVIX  Take 75 mg by mouth daily.     colchicine 0.6 MG tablet  Take 0.5 tablets  (0.3 mg total) by mouth 2 (two) times a week.  Notes to Patient:  The dose for this medication has been changed.     DALIRESP 500 MCG Tabs tablet  Generic drug:  roflumilast  Take 500 mcg by mouth daily.     DETROL LA 4 MG 24 hr capsule  Generic drug:  tolterodine  Take 4 mg by mouth daily.     midodrine 10 MG tablet  Commonly known as:  PROAMATINE  Take 1 tablet (10 mg total) by mouth 2 (two) times daily with a meal.  Notes to Patient:  New medication     multivitamin Tabs tablet  Take 1 tablet by mouth daily.     pantoprazole 40 MG tablet  Commonly known as:  PROTONIX  Take 40 mg by mouth daily.     senna 8.6 MG Tabs tablet  Commonly known as:  SENOKOT  Take 1 tablet by mouth daily.     theophylline 100 MG 12 hr tablet  Commonly known as:  THEODUR  Take 100 mg by mouth 2 (two) times daily.     tiotropium 18 MCG inhalation capsule  Commonly known as:  SPIRIVA  Place 18 mcg into inhaler and inhale daily.       No Known Allergies    The results of significant diagnostics from this hospitalization (including imaging, microbiology, ancillary and laboratory) are listed below for reference.    Significant Diagnostic Studies: Ct Coronary Morp W/cta Cor W/score W/ca W/cm &/or Wo/cm  08/03/2014   ADDENDUM REPORT: 08/03/2014 18:54  CLINICAL DATA:  Abnormal echocardiogram suspicious for calcified lesion in the ascending thoracic aorta and in the left atrium. Rule out dissection and left atrial mass.  EXAM: Cardiac/Coronary  CT  TECHNIQUE: The patient was scanned on a Philips 256 scanner.  FINDINGS: A 120 kV prospective scan was triggered in the descending thoracic aorta at 111 HU's. Axial non-contrast 3mm slices were carried out through the heart. The data set was analyzed on a dedicated work station and scored using the Agatson method. Gantry rotation speed was 270 msecs and collimation was .9 mm. No beta blockade and 0.4 mg of sl NTG was given. The 3D data set was reconstructed  in 5% intervals of the 67-82 % of the R-R cycle. Diastolic phases were analyzed on a dedicated work station using MPR, MIP and VRT modes. The patient received 80 cc of contrast.  Aorta: Heavily diffusely calcified with aneurysmal arch and tortuous and aneurysmal descending thoracic aorta with maximum diameter 40 x 33 mm. Severe atherosclerotic plaque. No dissection.  Dilated aortic root with dimensions of the  leaflets: Right: 38 mm, Left: 41 mm, Non-coronary 35 mm  Sinotubular junction:  37 x 34 mm  Ascending aorta:  37 x 36 mm  Aortic arch:  Aneurysmal measuring 33 x 28 mm  Descending thoracic aorta:  Aortic Valve:  Trileaflet, no calcifications.  Coronary Arteries: Originating in normal position. Right dominance.  RCA is a large dominant vessel that gives rise to PDA and PLVB. There is only minimal non-calcified plaque.  Left main is a large caliber, short vessel that has distal calcified plaque with positive remodeling associated with only 0-25% stenosis.  LAD is a moderate caliber long vessel that gives rise to one small diagonal branch. There is only minimal calcified plaque in mid LAD associated with 0-25% stenosis.  LCX is moderate caliber non-dominant vessel that is diffusely calcified. There is mild calcified plaque in the proximal portion associated with 25-50% stenosis and moderate circumferential plague in mid LAD associated with 50-70% stenosis. Distal LCX has moderate calcified plaque. OM is diffusely calcified with associated stenosis 25-50%.  Left atrium:  No mass seen.  IMPRESSION: 1. Coronary calcium score of 996. This was 52 percentile for age and sex matched control.  2.  Normal coronary origin.  Right dominance.  3. Moderate CAD with moderate diffuse non-obstructive calcified plaque predominantly in non-dominant LCX. An aggressive medical management is recommended.  4. No evidence of aortic dissection. Heavily calcified thoracic aorta with dilated root, and aneurysmal aortic arch and descending  aorta.  5. No evidence of left atrial mass. The lesion seen on echocardiogram most probably represents hypermobile septum.  Tobias Alexander   Electronically Signed   By: Tobias Alexander   On: 08/03/2014 18:54   08/03/2014   EXAM: OVER-READ INTERPRETATION  CT CHEST  The following report is an over-read performed by radiologist Dr. Maryelizabeth Rowan [facility] on 08/02/2014. This over-read does not include interpretation of cardiac or coronary anatomy or pathology. The coronary CTA interpretation by the cardiologist is attached.  COMPARISON:  CT thorax 06/16/2008  FINDINGS: There is a new irregular lobulated nodule in the right upper lobe measuring 19 x 13 mm (image 29, series 201). Small bilateral pleural effusions. No evidence of mediastinal adenopathy on this noncontrast exam. The supraclavicular nodal stations not well assessed.  Limited view of the upper abdomen is unremarkable. Limited view of the skeleton demonstrates degenerative spurring of the thoracic spine.  Images saccular aneurysm descending thoracic measuring 14 mm in depth on sagittal image 57.  IMPRESSION: 1. New lobular nodule in the right upper lobe is concerning for bronchogenic carcinoma. Recommend FDG PET / CT scan for characterization and staging. 2.   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  Electronically Signed: By: Genevive Bi M.D. On: 08/02/2014 14:44   Dg Chest Port 1 View  08/01/2014   CLINICAL DATA:  Respiratory failure.  EXAM: PORTABLE CHEST - 1 VIEW  COMPARISON:  07/30/2014  FINDINGS: The endotracheal tube and nasogastric tube have been removed. Left IJ central line tip overlies the level of the superior vena cava. Right-sided dialysis catheter tip overlies the level of the upper right atrium.  The heart is enlarged. There is opacity at  the lung bases, similar in appearance on the left and slightly increased on the right. Bilateral pleural effusions are suspected.  IMPRESSION: 1. Interval removal of nasogastric tube and endotracheal tube.  2. Increased atelectasis at the right lung base.   Electronically Signed   By: Rosalie GumsBeth  Brown M.D.   On: 08/01/2014 07:13   Dg Chest Port 1 View  07/30/2014   CLINICAL DATA:  80 year old female currently admitted with acute respiratory failure. Subsequent encounter.  EXAM: PORTABLE CHEST - 1 VIEW  COMPARISON:  Multiple recent prior chest x-rays. Most recent prior chest x-ray 07/29/2014  FINDINGS: The tip of the endotracheal tube is 2.2 cm above the carina. The proximal side hole of the nasogastric tube overlies the gastric fundus. A left IJ approach central venous catheter has advanced. The tip now overlies the mid SVC in good position. Right IJ approach tunneled hemodialysis catheter. Catheter tip at the superior cavoatrial junction in good position.  Stable enlargement of the cardiopericardial silhouette. Probable left pleural effusion and associated atelectasis. Slight interval improvement in pulmonary vascular congestion. Inspiratory volumes remain low. No pneumothorax.  IMPRESSION: 1. The left IJ central venous catheter has advanced so that the tip now overlies the mid SVC. Other support apparatus remain in unchanged position. 2. Resolved pulmonary vascular congestion. 3. Persistent globular enlargement of the cardiopericardial silhouette which may reflect cardiomegaly and/or pericardial effusion. 4. Persistent low inspiratory volumes with left greater than right basilar atelectasis. 5. Suspect layering left pleural effusion.   Electronically Signed   By: Malachy MoanHeath  McCullough M.D.   On: 07/30/2014 07:39   Dg Chest Port 1 View  07/29/2014   CLINICAL DATA:  Respiratory failure.  Shortness of breath.  EXAM: PORTABLE CHEST - 1 VIEW  COMPARISON:  07/28/2014  FINDINGS: Endotracheal tube is in place with tip 2.5 cm  above carina. Nasogastric tube is in place with tip off the film but the on the gastroesophageal junction. Right IJ central line tip overlies the level of the superior vena cava. Left IJ central line tip overlies the level of the brachiocephalic vein confluence with the superior vena cava.  Heart is enlarged. There is dense opacity at the left lung base which obscures the hemidiaphragm. Opacity has increased over recent exams. There is mild perihilar edema.  IMPRESSION: 1. Dense left lower lobe consolidation, increased. 2. Cardiomegaly and mild edema.   Electronically Signed   By: Rosalie GumsBeth  Brown M.D.   On: 07/29/2014 07:10   Dg Chest Port 1 View  07/28/2014   CLINICAL DATA:  Evaluate ET tube placement  EXAM: PORTABLE CHEST - 1 VIEW  COMPARISON:  07/28/2014  FINDINGS: There is a left IJ catheter with tip in the projection of the SVC. Right-sided dialysis catheter is noted with tip in the cavoatrial junction. The ET tube tip is at the carina. There is a nasogastric tube with side port below GE junction. Stable cardiac enlargement. Calcified plaque is noted within the thoracic aorta. There is a moderate left pleural effusion noted.  IMPRESSION: 1. Support apparatus positioned as described above. 2. Cardiac enlargement and CHF.  Unchanged.   Electronically Signed   By: Signa Kellaylor  Stroud M.D.   On: 07/28/2014 10:08   Dg Chest Port 1 View  07/28/2014   CLINICAL DATA:  Acute onset shortness of breath. End-stage renal disease on hemodialysis.  EXAM: PORTABLE CHEST - 1 VIEW  COMPARISON:  Two-view chest x-ray yesterday, 07/05/2014, 03/17/2014.  FINDINGS: Cardiac silhouette markedly enlarged but stable. Pulmonary venous hypertension without overt pulmonary edema. Bilateral pleural effusions, left greater than right. Prominent paracardiac fat pad on the left mimics a lingular opacity, but this has been present on multiple prior examinations. Dialysis catheter tips project  over the lower SVC and cavoatrial junction.  IMPRESSION:  Stable marked cardiomegaly. Pulmonary venous hypertension without overt edema. Bilateral pleural effusions, left greater than right. No acute cardiopulmonary disease otherwise.   Electronically Signed   By: Hulan Saashomas  Lawrence M.D.   On: 07/28/2014 08:56   Ct Angio Chest Aortic Dissect W &/or W/o  08/02/2014   CLINICAL DATA:  Thoracic aortic aneurysm and left-sided chest pain.  EXAM: CT ANGIOGRAPHY CHEST WITH CONTRAST  TECHNIQUE: Multidetector CT imaging of the chest was performed using the standard protocol during bolus administration of intravenous contrast. Multiplanar CT image reconstructions and MIPs were obtained to evaluate the vascular anatomy.  CONTRAST:  80mL OMNIPAQUE IOHEXOL 350 MG/ML SOLN  COMPARISON:  None.  FINDINGS: At the level of the sinuses of Valsalva, there is some asymmetric dilatation of the left aortic sinus which measures approximately 3 cm in maximal diameter. Maximal aortic diameter at the level of the sinuses is 4.6 cm. Sinotubular junction measures 3.7 cm. The proximal ascending thoracic aorta is not overtly dilated and measures 3.9 cm in greatest caliber. The proximal arch measures 3.5 cm. The distal arch measures 3.3 cm. Focal ulcerated plaque along the left lateral wall of the aortic arch present with depth of 7 mm and ulcer with approximately also 7 mm. Irregular atheromatous plaque is present at the level of the arch. The great vessels are extremely tortuous. The innominate artery shows aneurysmal dilatation up to approximately 2.1 cm in maximum caliber. Calcified plaque at the origin of the left subclavian artery causes approximately 40% stenosis.  The descending thoracic aorta just beyond the arch shows irregular medial outpouching consistent with focal aneurysmal disease with maximal diameter of the aorta of 4.2 cm at this level. The mid descending thoracic aorta is of normal caliber and measures 2.8 cm. The thoracic aorta then becomes dilated in the lower chest and is extremely  tortuous. Maximal diameter of the distal descending thoracic aorta is 4.3 cm. A the aorta measures 3.7 cm at the diaphragmatic hiatus.  There is no evidence of aortic dissection, intramural hemorrhage or aortic rupture. The visualized upper abdominal aorta demonstrates eccentric calcified plaque at the origin of the celiac axis causing approximately 50% stenosis.  Review of the MIP images confirms the above findings.  Nonvascular Findings:  As noted on the over read of the coronary CTA, there is a lobulated central mass in the right upper lobe measure approximately 1.3 x 2.0 x 1.4 cm in greatest dimensions. There is some adjacent nodularity/inferior extension towards the hilum. No enlarged lymph nodes are seen. Lungs show evidence of emphysema. There is left lower lobe atelectasis/consolidation and a small left pleural effusion. No pneumothorax. No significant airway obstruction or narrowing identified. The heart is moderately enlarged. No pericardial fluid is seen. A dialysis catheter is present.  IMPRESSION: 1. Asymmetric dilatation of the left aortic sinus. Maximal aortic diameter at the level of the sinuses of Valsalva is approximately 4.6 cm. 2. Ectasia of the thoracic aorta with maximal diameter of 4.2 cm at the level of the descending thoracic aorta. No aortic dissection is identified. Focal ulcerated plaque is identified at the level of the aortic arch. 3. 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 4. Lobulated central pulmonary mass of the right upper lobe measuring 1.3 x 2.0 x 1.4 cm. This has an appearance most likely consistent with primary lung carcinoma.   Electronically Signed   By: Irish LackGlenn  Yamagata M.D.   On: 08/02/2014  21:00    Microbiology: Recent Results (from the past 240 hour(s))  Culture, Urine     Status: None   Collection Time: 07/27/14  6:23 PM  Result Value Ref Range Status   Specimen Description URINE, RANDOM  Final   Special Requests  NONE  Final   Culture  Setup Time   Final    07/27/2014 22:15 Performed at [facility]    Colony Count NO GROWTH Performed at [facility]   Final   Culture NO GROWTH Performed at [facility]   Final   Report Status 07/28/2014 FINAL  Final  Culture, blood (routine x 2)     Status: None   Collection Time: 07/27/14  6:50 PM  Result Value Ref Range Status   Specimen Description BLOOD RIGHT ARM  Final   Special Requests BOTTLES DRAWN AEROBIC ONLY 5CC  Final   Culture  Setup Time   Final    07/27/2014 22:20 Performed at [facility]    Culture   Final    NO GROWTH 5 DAYS Performed at [facility]    Report Status 08/02/2014 FINAL  Final  MRSA PCR Screening     Status: None   Collection Time: 07/27/14  6:51 PM  Result Value Ref Range Status   MRSA by PCR NEGATIVE NEGATIVE Final    Comment:        The GeneXpert MRSA Assay (FDA approved for NASAL specimens only), is one component of a comprehensive MRSA colonization surveillance program. It is not intended to diagnose MRSA infection nor to guide or monitor treatment for MRSA infections.   Culture, blood (routine x 2)     Status: None   Collection Time: 07/27/14  6:55 PM  Result Value Ref Range Status   Specimen Description BLOOD RIGHT ARM  Final   Special Requests BOTTLES DRAWN AEROBIC ONLY 5CC  Final   Culture  Setup Time   Final    07/27/2014 22:23 Performed at [facility]    Culture   Final    NO GROWTH 5 DAYS Performed at [facility]    Report Status 08/02/2014 FINAL  Final  Culture, respiratory (NON-Expectorated)     Status: None   Collection Time: 07/28/14 10:25 AM  Result Value Ref Range Status   Specimen Description TRACHEAL ASPIRATE  Final   Special Requests NONE  Final   Gram Stain   Final    RARE WBC PRESENT,BOTH PMN AND MONONUCLEAR RARE SQUAMOUS EPITHELIAL CELLS PRESENT RARE GRAM POSITIVE COCCI IN PAIRS IN CLUSTERS RARE YEAST Performed at  [facility]    Culture   Final    Non-Pathogenic Oropharyngeal-type Flora Isolated. Performed at [facility]    Report Status 07/30/2014 FINAL  Final     Labs: Basic Metabolic Panel:  Recent Labs Lab 07/30/14 0409 07/31/14 0510 08/01/14 0200 08/02/14 0450 08/03/14 0757 08/05/14 0714  NA 138 143 143 141 141 144  K 3.7 3.8 4.0 3.5* 3.7 4.4  CL 99 103 103 101 101 103  CO2 24 28 28 27 26 26   GLUCOSE 84 66* 91 86 93 88  BUN 19 17 29* 16 27* 33*  CREATININE 5.11* 4.40* 6.26* 4.89* 7.44* 7.81*  CALCIUM 7.0* 7.3* 7.5* 7.5* 8.1* 8.7  MG 1.9 1.9 2.1  --   --   --   PHOS 2.9 3.3 3.9  --  4.7*  --    Liver Function Tests:  Recent Labs Lab 08/02/14  0450 08/03/14 0757  AST 17  --   ALT <5  --   ALKPHOS 41  --   BILITOT 0.4  --   PROT 6.5  --   ALBUMIN 2.6* 2.7*   No results for input(s): LIPASE, AMYLASE in the last 168 hours. No results for input(s): AMMONIA in the last 168 hours. CBC:  Recent Labs Lab 07/30/14 0409 07/31/14 0510 08/01/14 0200 08/02/14 0450 08/03/14 0757 08/05/14 0714  WBC 7.7 8.9 8.6 7.0 6.0 7.5  NEUTROABS 5.5 6.9 5.7  --   --   --   HGB 9.4* 9.2* 9.3* 9.1* 9.4* 9.5*  HCT 30.6* 30.2* 31.3* 30.6* 31.7* 32.3*  MCV 96.8 99.7 103.3* 100.0 103.3* 101.3*  PLT 166 140* 164 136* 169 193   Cardiac Enzymes: No results for input(s): CKTOTAL, CKMB, CKMBINDEX, TROPONINI in the last 168 hours. BNP: BNP (last 3 results) No results for input(s): PROBNP in the last 8760 hours. CBG:  Recent Labs Lab 07/29/14 1139 07/29/14 1628 07/29/14 2012 07/29/14 2312 07/30/14 0438  GLUCAP 103* 106* 98 87 86       Signed:  KRISHNAN,SENDIL K  [facility] 08/05/2014, 10:02 AM

## 2014-08-05 NOTE — Progress Notes (Signed)
Physical Therapy Treatment Patient Details Name: [name] MRN: [id number] DOB: 22-Jan-1929 Today's Date: 08/05/2014    History of Present Illness 16F with history atrial fibrillation, COPD on home O2, ESRD on HD MWF, HTN transferred [facility] 07/27/2014 with acute encephalopathy, weakness, confusion, UTI.[facility] was [facility]. VDRF 11/5-11/7.    PT Comments    Pt. Appears confused but is pleasant and conversant.  Pt. Felt as thought she needed to use the bathroom but did not void on 3 n 1.  Pt. Anticipated to DC to SNF , possibly later today.    Follow Up Recommendations  SNF;Supervision/Assistance - 24 hour     Equipment Recommendations  None recommended by PT    Recommendations for Other Services       Precautions / Restrictions Precautions Precautions: Fall Precaution Comments: orthostatic hypotension Restrictions Weight Bearing Restrictions: No    Mobility  Bed Mobility Overal bed mobility: Needs Assistance       Supine to sit: Min guard     General bed mobility comments: Pt. moved side to sit with min guard assist for safety  Transfers Overall transfer level: Needs assistance Equipment used: 1 person hand held assist Transfers: Sit to/from Stand;Stand Pivot Transfers Sit to Stand: Min assist Stand pivot transfers: Min assist       General transfer comment: Pt. indicated she thought she needed to use the bathroom.  Pt. assisted to 3 n 1 at the bedside with min assist.  Pt. unable to void and was assisted back to bed for EOB exercises.  .  Pt. needed min assist for LEs to lie down in bed  Ambulation/Gait Ambulation/Gait assistance:  (pt. declined)               Stairs            Wheelchair Mobility    Modified Rankin (Stroke Patients Only)       Balance                                    Cognition Arousal/Alertness: Awake/alert Behavior During Therapy: WFL for tasks  assessed/performed Overall Cognitive Status: Impaired/Different from baseline Area of Impairment: Orientation;Memory;Safety/judgement;Problem solving Orientation Level: Place;Time;Situation   Memory: Decreased short-term memory   Safety/Judgement: Decreased awareness of safety;Decreased awareness of deficits   Problem Solving: Slow processing;Difficulty sequencing;Requires verbal cues General Comments: Continued disorientation and pleasantly confused    Exercises General Exercises - Lower Extremity Ankle Circles/Pumps: AROM;Both;10 reps;Seated Long Arc Quad: AROM;Both;15 reps;Seated Hip Flexion/Marching: Seated;Both;10 reps    General Comments        Pertinent Vitals/Pain Pain Assessment: No/denies pain    Home Living                      Prior Function            PT Goals (current goals can now be found in the care plan section) Progress towards PT goals: Progressing toward goals    Frequency  Min 2X/week    PT Plan Current plan remains appropriate    Co-evaluation             End of Session Equipment Utilized During Treatment: Oxygen Activity Tolerance: Patient limited by fatigue Patient left: in bed;with call bell/phone within reach;with bed alarm set     Time: [id number] PT Time Calculation (min) (ACUTE ONLY): 25 min  Charges:  $Therapeutic Exercise:  8-22 mins $Therapeutic Activity: 8-22 mins                    G Codes:      Ferman HammingBlankenship, Anja Neuzil B 08/05/2014, 4:51 PM Weldon PickingSusan Kaylee Wombles PT Acute Rehab Services [phone] Bee[place] [phone]

## 2014-08-05 NOTE — Progress Notes (Signed)
Subjective:  No complaints, wants to eat  Objective: Vital signs in last 24 hours: Temp:  [97.7 F (36.5 C)-99.1 F (37.3 C)] 98.3 F (36.8 C) (11/13 0707) Pulse Rate:  [69-82] 74 (11/13 0707) Resp:  [18-22] 19 (11/13 0707) BP: (97-133)/(45-62) 133/62 mmHg (11/13 0707) SpO2:  [92 %-100 %] 100 % (11/13 0707) Weight:  [63.3 kg (139 lb 8.8 oz)-67.5 kg (148 lb 13 oz)] 63.3 kg (139 lb 8.8 oz) (11/13 0707) Weight change: 2.4 kg (5 lb 4.7 oz)  Intake/Output from previous day: 11/12 0701 - 11/13 0700 In: 600 [P.O.:600] Out: -  Intake/Output this shift:   EXAM: General appearance:  Alert, in no apparent distress Resp:  CTA without rales, rhonchi, or wheezes Cardio:  RRR without murmur or rub GI: + BS, soft and nontender Extremities:   No edema  Access:  R IJ catheter  Lab Results:  Recent Labs  08/03/14 0757  WBC 6.0  HGB 9.4*  HCT 31.7*  PLT 169   BMET:  Recent Labs  08/03/14 0757  NA 141  K 3.7  CL 101  CO2 26  GLUCOSE 93  BUN 27*  CREATININE 7.44*  CALCIUM 8.1*  ALBUMIN 2.7*   No results for input(s): PTH in the last 72 hours. Iron Studies: No results for input(s): IRON, TIBC, TRANSFERRIN, FERRITIN in the last 72 hours.  Studies/Results: No results found.   HD: MWF [place] 3.5h 69kg 2/2.25 BAth Heparin 3000 R IJ cath Aranesp 25 ug every 2 weeks, Calcitriol 1.0 ug TIW pth 991, Ca 7.4, P 4.5   Assessment/Plan: 1. Respiratory failure - sec to PNA, COPD, edema; also RUL mass; on home O2. 2. Lung mass - lobulated RUL mass (1.3 x 2 x 1.4 cm) per CT 11/10, likely carcinoma, per admit. 3. ESRD - HD on MWF @ [facility], K 3.7.  HD pending. 4. HTN/Volume - BP 133/62 on Midodrine 10 mg bid; below EDW with UF goal 2 L today. 5. Anemia - Hgb 9.4, on Aranesp 40 mcg on Wed. 6. Sec HPT - Ca 8.1 (9.1 corrected), P 4.7; Calcitriol 1 mcg, no binders. 7. Nutrition - Alb 2.7, renal diet, vitamin. 8. A-fib - rate controlled, on Amiodarone.   LOS: 9 days    LYLES,CHARLES 08/05/2014,7:24 AM  I have seen and examined this patient and agree with plan per Gerome Apleyharles Lyles.  Pt seen on HD.  Ap 200 Vp 150 BFR 350.  Co constipation, will order sorbitol.  Awaiting SNF and pulm consult. Tandrea Kommer T,MD 08/05/2014 9:01 AM

## 2014-08-05 NOTE — Progress Notes (Signed)
Called report to Luiz IronPam Thacker, [facility].

## 2014-09-23 DIAGNOSIS — E876 Hypokalemia: Secondary | ICD-10-CM | POA: Diagnosis not present

## 2014-09-23 DIAGNOSIS — D631 Anemia in chronic kidney disease: Secondary | ICD-10-CM | POA: Diagnosis not present

## 2014-09-23 DIAGNOSIS — Z4901 Encounter for fitting and adjustment of extracorporeal dialysis catheter: Secondary | ICD-10-CM | POA: Diagnosis not present

## 2014-09-23 DIAGNOSIS — Z4931 Encounter for adequacy testing for hemodialysis: Secondary | ICD-10-CM | POA: Diagnosis not present

## 2014-09-23 DIAGNOSIS — N186 End stage renal disease: Secondary | ICD-10-CM | POA: Diagnosis not present

## 2014-09-23 DIAGNOSIS — N2581 Secondary hyperparathyroidism of renal origin: Secondary | ICD-10-CM | POA: Diagnosis not present

## 2014-09-23 DIAGNOSIS — D689 Coagulation defect, unspecified: Secondary | ICD-10-CM | POA: Diagnosis not present

## 2014-09-26 DIAGNOSIS — D689 Coagulation defect, unspecified: Secondary | ICD-10-CM | POA: Diagnosis not present

## 2014-09-26 DIAGNOSIS — N2581 Secondary hyperparathyroidism of renal origin: Secondary | ICD-10-CM | POA: Diagnosis not present

## 2014-09-26 DIAGNOSIS — E876 Hypokalemia: Secondary | ICD-10-CM | POA: Diagnosis not present

## 2014-09-26 DIAGNOSIS — Z4901 Encounter for fitting and adjustment of extracorporeal dialysis catheter: Secondary | ICD-10-CM | POA: Diagnosis not present

## 2014-09-26 DIAGNOSIS — D631 Anemia in chronic kidney disease: Secondary | ICD-10-CM | POA: Diagnosis not present

## 2014-09-26 DIAGNOSIS — Z4931 Encounter for adequacy testing for hemodialysis: Secondary | ICD-10-CM | POA: Diagnosis not present

## 2014-09-26 DIAGNOSIS — N186 End stage renal disease: Secondary | ICD-10-CM | POA: Diagnosis not present

## 2014-09-27 DIAGNOSIS — F17210 Nicotine dependence, cigarettes, uncomplicated: Secondary | ICD-10-CM | POA: Diagnosis not present

## 2014-09-27 DIAGNOSIS — I120 Hypertensive chronic kidney disease with stage 5 chronic kidney disease or end stage renal disease: Secondary | ICD-10-CM | POA: Diagnosis not present

## 2014-09-27 DIAGNOSIS — Z992 Dependence on renal dialysis: Secondary | ICD-10-CM | POA: Diagnosis not present

## 2014-09-27 DIAGNOSIS — N186 End stage renal disease: Secondary | ICD-10-CM | POA: Diagnosis not present

## 2014-09-27 DIAGNOSIS — J449 Chronic obstructive pulmonary disease, unspecified: Secondary | ICD-10-CM | POA: Diagnosis not present

## 2014-09-27 DIAGNOSIS — Z9981 Dependence on supplemental oxygen: Secondary | ICD-10-CM | POA: Diagnosis not present

## 2014-09-27 DIAGNOSIS — Z7982 Long term (current) use of aspirin: Secondary | ICD-10-CM | POA: Diagnosis not present

## 2014-09-27 DIAGNOSIS — J9610 Chronic respiratory failure, unspecified whether with hypoxia or hypercapnia: Secondary | ICD-10-CM | POA: Diagnosis not present

## 2014-09-27 DIAGNOSIS — I4891 Unspecified atrial fibrillation: Secondary | ICD-10-CM | POA: Diagnosis not present

## 2014-09-28 DIAGNOSIS — E876 Hypokalemia: Secondary | ICD-10-CM | POA: Diagnosis not present

## 2014-09-28 DIAGNOSIS — D631 Anemia in chronic kidney disease: Secondary | ICD-10-CM | POA: Diagnosis not present

## 2014-09-28 DIAGNOSIS — D689 Coagulation defect, unspecified: Secondary | ICD-10-CM | POA: Diagnosis not present

## 2014-09-28 DIAGNOSIS — Z4931 Encounter for adequacy testing for hemodialysis: Secondary | ICD-10-CM | POA: Diagnosis not present

## 2014-09-28 DIAGNOSIS — Z4901 Encounter for fitting and adjustment of extracorporeal dialysis catheter: Secondary | ICD-10-CM | POA: Diagnosis not present

## 2014-09-28 DIAGNOSIS — N186 End stage renal disease: Secondary | ICD-10-CM | POA: Diagnosis not present

## 2014-09-28 DIAGNOSIS — N2581 Secondary hyperparathyroidism of renal origin: Secondary | ICD-10-CM | POA: Diagnosis not present

## 2014-09-29 DIAGNOSIS — I4891 Unspecified atrial fibrillation: Secondary | ICD-10-CM | POA: Diagnosis not present

## 2014-09-29 DIAGNOSIS — N186 End stage renal disease: Secondary | ICD-10-CM | POA: Diagnosis not present

## 2014-09-29 DIAGNOSIS — Z992 Dependence on renal dialysis: Secondary | ICD-10-CM | POA: Diagnosis not present

## 2014-09-29 DIAGNOSIS — I120 Hypertensive chronic kidney disease with stage 5 chronic kidney disease or end stage renal disease: Secondary | ICD-10-CM | POA: Diagnosis not present

## 2014-09-29 DIAGNOSIS — J9610 Chronic respiratory failure, unspecified whether with hypoxia or hypercapnia: Secondary | ICD-10-CM | POA: Diagnosis not present

## 2014-09-29 DIAGNOSIS — F17210 Nicotine dependence, cigarettes, uncomplicated: Secondary | ICD-10-CM | POA: Diagnosis not present

## 2014-09-29 DIAGNOSIS — Z7982 Long term (current) use of aspirin: Secondary | ICD-10-CM | POA: Diagnosis not present

## 2014-09-29 DIAGNOSIS — J449 Chronic obstructive pulmonary disease, unspecified: Secondary | ICD-10-CM | POA: Diagnosis not present

## 2014-09-29 DIAGNOSIS — Z9981 Dependence on supplemental oxygen: Secondary | ICD-10-CM | POA: Diagnosis not present

## 2014-09-30 DIAGNOSIS — D631 Anemia in chronic kidney disease: Secondary | ICD-10-CM | POA: Diagnosis not present

## 2014-09-30 DIAGNOSIS — N186 End stage renal disease: Secondary | ICD-10-CM | POA: Diagnosis not present

## 2014-09-30 DIAGNOSIS — D689 Coagulation defect, unspecified: Secondary | ICD-10-CM | POA: Diagnosis not present

## 2014-09-30 DIAGNOSIS — Z4931 Encounter for adequacy testing for hemodialysis: Secondary | ICD-10-CM | POA: Diagnosis not present

## 2014-09-30 DIAGNOSIS — N2581 Secondary hyperparathyroidism of renal origin: Secondary | ICD-10-CM | POA: Diagnosis not present

## 2014-09-30 DIAGNOSIS — Z4901 Encounter for fitting and adjustment of extracorporeal dialysis catheter: Secondary | ICD-10-CM | POA: Diagnosis not present

## 2014-09-30 DIAGNOSIS — E876 Hypokalemia: Secondary | ICD-10-CM | POA: Diagnosis not present

## 2014-10-02 DIAGNOSIS — I509 Heart failure, unspecified: Secondary | ICD-10-CM | POA: Diagnosis not present

## 2014-10-02 DIAGNOSIS — E119 Type 2 diabetes mellitus without complications: Secondary | ICD-10-CM | POA: Diagnosis not present

## 2014-10-02 DIAGNOSIS — N19 Unspecified kidney failure: Secondary | ICD-10-CM | POA: Diagnosis not present

## 2014-10-02 DIAGNOSIS — N186 End stage renal disease: Secondary | ICD-10-CM | POA: Diagnosis not present

## 2014-10-02 DIAGNOSIS — R1030 Lower abdominal pain, unspecified: Secondary | ICD-10-CM | POA: Diagnosis not present

## 2014-10-02 DIAGNOSIS — N189 Chronic kidney disease, unspecified: Secondary | ICD-10-CM | POA: Diagnosis not present

## 2014-10-02 DIAGNOSIS — R911 Solitary pulmonary nodule: Secondary | ICD-10-CM | POA: Diagnosis not present

## 2014-10-02 DIAGNOSIS — E860 Dehydration: Secondary | ICD-10-CM | POA: Diagnosis not present

## 2014-10-02 DIAGNOSIS — R1110 Vomiting, unspecified: Secondary | ICD-10-CM | POA: Diagnosis not present

## 2014-10-02 DIAGNOSIS — K5730 Diverticulosis of large intestine without perforation or abscess without bleeding: Secondary | ICD-10-CM | POA: Diagnosis not present

## 2014-10-02 DIAGNOSIS — Z7982 Long term (current) use of aspirin: Secondary | ICD-10-CM | POA: Diagnosis not present

## 2014-10-02 DIAGNOSIS — R112 Nausea with vomiting, unspecified: Secondary | ICD-10-CM | POA: Diagnosis not present

## 2014-10-02 DIAGNOSIS — N390 Urinary tract infection, site not specified: Secondary | ICD-10-CM | POA: Diagnosis not present

## 2014-10-02 DIAGNOSIS — Z792 Long term (current) use of antibiotics: Secondary | ICD-10-CM | POA: Diagnosis not present

## 2014-10-02 DIAGNOSIS — I120 Hypertensive chronic kidney disease with stage 5 chronic kidney disease or end stage renal disease: Secondary | ICD-10-CM | POA: Diagnosis not present

## 2014-10-02 DIAGNOSIS — E780 Pure hypercholesterolemia: Secondary | ICD-10-CM | POA: Diagnosis not present

## 2014-10-02 DIAGNOSIS — K8020 Calculus of gallbladder without cholecystitis without obstruction: Secondary | ICD-10-CM | POA: Diagnosis not present

## 2014-10-02 DIAGNOSIS — I714 Abdominal aortic aneurysm, without rupture: Secondary | ICD-10-CM | POA: Diagnosis not present

## 2014-10-02 DIAGNOSIS — R918 Other nonspecific abnormal finding of lung field: Secondary | ICD-10-CM | POA: Diagnosis not present

## 2014-10-03 DIAGNOSIS — E876 Hypokalemia: Secondary | ICD-10-CM | POA: Diagnosis not present

## 2014-10-03 DIAGNOSIS — N186 End stage renal disease: Secondary | ICD-10-CM | POA: Diagnosis not present

## 2014-10-03 DIAGNOSIS — Z4901 Encounter for fitting and adjustment of extracorporeal dialysis catheter: Secondary | ICD-10-CM | POA: Diagnosis not present

## 2014-10-03 DIAGNOSIS — D689 Coagulation defect, unspecified: Secondary | ICD-10-CM | POA: Diagnosis not present

## 2014-10-03 DIAGNOSIS — N2581 Secondary hyperparathyroidism of renal origin: Secondary | ICD-10-CM | POA: Diagnosis not present

## 2014-10-03 DIAGNOSIS — Z4931 Encounter for adequacy testing for hemodialysis: Secondary | ICD-10-CM | POA: Diagnosis not present

## 2014-10-03 DIAGNOSIS — D631 Anemia in chronic kidney disease: Secondary | ICD-10-CM | POA: Diagnosis not present

## 2014-10-04 DIAGNOSIS — I4891 Unspecified atrial fibrillation: Secondary | ICD-10-CM | POA: Diagnosis not present

## 2014-10-04 DIAGNOSIS — J9610 Chronic respiratory failure, unspecified whether with hypoxia or hypercapnia: Secondary | ICD-10-CM | POA: Diagnosis not present

## 2014-10-04 DIAGNOSIS — F17210 Nicotine dependence, cigarettes, uncomplicated: Secondary | ICD-10-CM | POA: Diagnosis not present

## 2014-10-04 DIAGNOSIS — Z992 Dependence on renal dialysis: Secondary | ICD-10-CM | POA: Diagnosis not present

## 2014-10-04 DIAGNOSIS — Z7982 Long term (current) use of aspirin: Secondary | ICD-10-CM | POA: Diagnosis not present

## 2014-10-04 DIAGNOSIS — I120 Hypertensive chronic kidney disease with stage 5 chronic kidney disease or end stage renal disease: Secondary | ICD-10-CM | POA: Diagnosis not present

## 2014-10-04 DIAGNOSIS — Z9981 Dependence on supplemental oxygen: Secondary | ICD-10-CM | POA: Diagnosis not present

## 2014-10-04 DIAGNOSIS — J449 Chronic obstructive pulmonary disease, unspecified: Secondary | ICD-10-CM | POA: Diagnosis not present

## 2014-10-04 DIAGNOSIS — N186 End stage renal disease: Secondary | ICD-10-CM | POA: Diagnosis not present

## 2014-10-05 DIAGNOSIS — Z4931 Encounter for adequacy testing for hemodialysis: Secondary | ICD-10-CM | POA: Diagnosis not present

## 2014-10-05 DIAGNOSIS — D689 Coagulation defect, unspecified: Secondary | ICD-10-CM | POA: Diagnosis not present

## 2014-10-05 DIAGNOSIS — Z4901 Encounter for fitting and adjustment of extracorporeal dialysis catheter: Secondary | ICD-10-CM | POA: Diagnosis not present

## 2014-10-05 DIAGNOSIS — D631 Anemia in chronic kidney disease: Secondary | ICD-10-CM | POA: Diagnosis not present

## 2014-10-05 DIAGNOSIS — N186 End stage renal disease: Secondary | ICD-10-CM | POA: Diagnosis not present

## 2014-10-05 DIAGNOSIS — N2581 Secondary hyperparathyroidism of renal origin: Secondary | ICD-10-CM | POA: Diagnosis not present

## 2014-10-05 DIAGNOSIS — E876 Hypokalemia: Secondary | ICD-10-CM | POA: Diagnosis not present

## 2014-10-07 DIAGNOSIS — N186 End stage renal disease: Secondary | ICD-10-CM | POA: Diagnosis not present

## 2014-10-07 DIAGNOSIS — E876 Hypokalemia: Secondary | ICD-10-CM | POA: Diagnosis not present

## 2014-10-07 DIAGNOSIS — D689 Coagulation defect, unspecified: Secondary | ICD-10-CM | POA: Diagnosis not present

## 2014-10-07 DIAGNOSIS — Z4901 Encounter for fitting and adjustment of extracorporeal dialysis catheter: Secondary | ICD-10-CM | POA: Diagnosis not present

## 2014-10-07 DIAGNOSIS — N2581 Secondary hyperparathyroidism of renal origin: Secondary | ICD-10-CM | POA: Diagnosis not present

## 2014-10-07 DIAGNOSIS — D631 Anemia in chronic kidney disease: Secondary | ICD-10-CM | POA: Diagnosis not present

## 2014-10-07 DIAGNOSIS — Z4931 Encounter for adequacy testing for hemodialysis: Secondary | ICD-10-CM | POA: Diagnosis not present

## 2014-10-10 DIAGNOSIS — D689 Coagulation defect, unspecified: Secondary | ICD-10-CM | POA: Diagnosis not present

## 2014-10-10 DIAGNOSIS — N2581 Secondary hyperparathyroidism of renal origin: Secondary | ICD-10-CM | POA: Diagnosis not present

## 2014-10-10 DIAGNOSIS — Z4931 Encounter for adequacy testing for hemodialysis: Secondary | ICD-10-CM | POA: Diagnosis not present

## 2014-10-10 DIAGNOSIS — Z4901 Encounter for fitting and adjustment of extracorporeal dialysis catheter: Secondary | ICD-10-CM | POA: Diagnosis not present

## 2014-10-10 DIAGNOSIS — N186 End stage renal disease: Secondary | ICD-10-CM | POA: Diagnosis not present

## 2014-10-10 DIAGNOSIS — D631 Anemia in chronic kidney disease: Secondary | ICD-10-CM | POA: Diagnosis not present

## 2014-10-10 DIAGNOSIS — E876 Hypokalemia: Secondary | ICD-10-CM | POA: Diagnosis not present

## 2014-10-11 DIAGNOSIS — R918 Other nonspecific abnormal finding of lung field: Secondary | ICD-10-CM | POA: Diagnosis not present

## 2014-10-11 DIAGNOSIS — R197 Diarrhea, unspecified: Secondary | ICD-10-CM | POA: Diagnosis not present

## 2014-10-11 DIAGNOSIS — J449 Chronic obstructive pulmonary disease, unspecified: Secondary | ICD-10-CM | POA: Diagnosis not present

## 2014-10-11 DIAGNOSIS — I714 Abdominal aortic aneurysm, without rupture: Secondary | ICD-10-CM | POA: Diagnosis not present

## 2014-10-12 DIAGNOSIS — Z4901 Encounter for fitting and adjustment of extracorporeal dialysis catheter: Secondary | ICD-10-CM | POA: Diagnosis not present

## 2014-10-12 DIAGNOSIS — N2581 Secondary hyperparathyroidism of renal origin: Secondary | ICD-10-CM | POA: Diagnosis not present

## 2014-10-12 DIAGNOSIS — D631 Anemia in chronic kidney disease: Secondary | ICD-10-CM | POA: Diagnosis not present

## 2014-10-12 DIAGNOSIS — D689 Coagulation defect, unspecified: Secondary | ICD-10-CM | POA: Diagnosis not present

## 2014-10-12 DIAGNOSIS — E876 Hypokalemia: Secondary | ICD-10-CM | POA: Diagnosis not present

## 2014-10-12 DIAGNOSIS — Z4931 Encounter for adequacy testing for hemodialysis: Secondary | ICD-10-CM | POA: Diagnosis not present

## 2014-10-12 DIAGNOSIS — N186 End stage renal disease: Secondary | ICD-10-CM | POA: Diagnosis not present

## 2014-10-14 DIAGNOSIS — D689 Coagulation defect, unspecified: Secondary | ICD-10-CM | POA: Diagnosis not present

## 2014-10-14 DIAGNOSIS — N186 End stage renal disease: Secondary | ICD-10-CM | POA: Diagnosis not present

## 2014-10-14 DIAGNOSIS — Z4901 Encounter for fitting and adjustment of extracorporeal dialysis catheter: Secondary | ICD-10-CM | POA: Diagnosis not present

## 2014-10-14 DIAGNOSIS — E876 Hypokalemia: Secondary | ICD-10-CM | POA: Diagnosis not present

## 2014-10-14 DIAGNOSIS — Z4931 Encounter for adequacy testing for hemodialysis: Secondary | ICD-10-CM | POA: Diagnosis not present

## 2014-10-14 DIAGNOSIS — N2581 Secondary hyperparathyroidism of renal origin: Secondary | ICD-10-CM | POA: Diagnosis not present

## 2014-10-14 DIAGNOSIS — D631 Anemia in chronic kidney disease: Secondary | ICD-10-CM | POA: Diagnosis not present

## 2014-10-17 DIAGNOSIS — N186 End stage renal disease: Secondary | ICD-10-CM | POA: Diagnosis not present

## 2014-10-17 DIAGNOSIS — Z4931 Encounter for adequacy testing for hemodialysis: Secondary | ICD-10-CM | POA: Diagnosis not present

## 2014-10-17 DIAGNOSIS — D689 Coagulation defect, unspecified: Secondary | ICD-10-CM | POA: Diagnosis not present

## 2014-10-17 DIAGNOSIS — N2581 Secondary hyperparathyroidism of renal origin: Secondary | ICD-10-CM | POA: Diagnosis not present

## 2014-10-17 DIAGNOSIS — D631 Anemia in chronic kidney disease: Secondary | ICD-10-CM | POA: Diagnosis not present

## 2014-10-17 DIAGNOSIS — Z4901 Encounter for fitting and adjustment of extracorporeal dialysis catheter: Secondary | ICD-10-CM | POA: Diagnosis not present

## 2014-10-17 DIAGNOSIS — E876 Hypokalemia: Secondary | ICD-10-CM | POA: Diagnosis not present

## 2014-10-19 DIAGNOSIS — Z4901 Encounter for fitting and adjustment of extracorporeal dialysis catheter: Secondary | ICD-10-CM | POA: Diagnosis not present

## 2014-10-19 DIAGNOSIS — Z4931 Encounter for adequacy testing for hemodialysis: Secondary | ICD-10-CM | POA: Diagnosis not present

## 2014-10-19 DIAGNOSIS — D689 Coagulation defect, unspecified: Secondary | ICD-10-CM | POA: Diagnosis not present

## 2014-10-19 DIAGNOSIS — D631 Anemia in chronic kidney disease: Secondary | ICD-10-CM | POA: Diagnosis not present

## 2014-10-19 DIAGNOSIS — E876 Hypokalemia: Secondary | ICD-10-CM | POA: Diagnosis not present

## 2014-10-19 DIAGNOSIS — N2581 Secondary hyperparathyroidism of renal origin: Secondary | ICD-10-CM | POA: Diagnosis not present

## 2014-10-19 DIAGNOSIS — N186 End stage renal disease: Secondary | ICD-10-CM | POA: Diagnosis not present

## 2014-10-20 DIAGNOSIS — I120 Hypertensive chronic kidney disease with stage 5 chronic kidney disease or end stage renal disease: Secondary | ICD-10-CM | POA: Diagnosis not present

## 2014-10-20 DIAGNOSIS — Z9981 Dependence on supplemental oxygen: Secondary | ICD-10-CM | POA: Diagnosis not present

## 2014-10-20 DIAGNOSIS — Z7982 Long term (current) use of aspirin: Secondary | ICD-10-CM | POA: Diagnosis not present

## 2014-10-20 DIAGNOSIS — N186 End stage renal disease: Secondary | ICD-10-CM | POA: Diagnosis not present

## 2014-10-20 DIAGNOSIS — J449 Chronic obstructive pulmonary disease, unspecified: Secondary | ICD-10-CM | POA: Diagnosis not present

## 2014-10-20 DIAGNOSIS — I4891 Unspecified atrial fibrillation: Secondary | ICD-10-CM | POA: Diagnosis not present

## 2014-10-20 DIAGNOSIS — Z992 Dependence on renal dialysis: Secondary | ICD-10-CM | POA: Diagnosis not present

## 2014-10-20 DIAGNOSIS — F17210 Nicotine dependence, cigarettes, uncomplicated: Secondary | ICD-10-CM | POA: Diagnosis not present

## 2014-10-20 DIAGNOSIS — J9610 Chronic respiratory failure, unspecified whether with hypoxia or hypercapnia: Secondary | ICD-10-CM | POA: Diagnosis not present

## 2014-10-21 DIAGNOSIS — N186 End stage renal disease: Secondary | ICD-10-CM | POA: Diagnosis not present

## 2014-10-21 DIAGNOSIS — Z4931 Encounter for adequacy testing for hemodialysis: Secondary | ICD-10-CM | POA: Diagnosis not present

## 2014-10-21 DIAGNOSIS — N2581 Secondary hyperparathyroidism of renal origin: Secondary | ICD-10-CM | POA: Diagnosis not present

## 2014-10-21 DIAGNOSIS — D631 Anemia in chronic kidney disease: Secondary | ICD-10-CM | POA: Diagnosis not present

## 2014-10-21 DIAGNOSIS — E876 Hypokalemia: Secondary | ICD-10-CM | POA: Diagnosis not present

## 2014-10-21 DIAGNOSIS — D689 Coagulation defect, unspecified: Secondary | ICD-10-CM | POA: Diagnosis not present

## 2014-10-21 DIAGNOSIS — Z4901 Encounter for fitting and adjustment of extracorporeal dialysis catheter: Secondary | ICD-10-CM | POA: Diagnosis not present

## 2014-10-22 DIAGNOSIS — J449 Chronic obstructive pulmonary disease, unspecified: Secondary | ICD-10-CM | POA: Diagnosis not present

## 2014-10-23 DIAGNOSIS — N186 End stage renal disease: Secondary | ICD-10-CM | POA: Diagnosis not present

## 2014-10-23 DIAGNOSIS — Z992 Dependence on renal dialysis: Secondary | ICD-10-CM | POA: Diagnosis not present

## 2014-10-24 DIAGNOSIS — N2581 Secondary hyperparathyroidism of renal origin: Secondary | ICD-10-CM | POA: Diagnosis not present

## 2014-10-24 DIAGNOSIS — Z4901 Encounter for fitting and adjustment of extracorporeal dialysis catheter: Secondary | ICD-10-CM | POA: Diagnosis not present

## 2014-10-24 DIAGNOSIS — N186 End stage renal disease: Secondary | ICD-10-CM | POA: Diagnosis not present

## 2014-10-24 DIAGNOSIS — D689 Coagulation defect, unspecified: Secondary | ICD-10-CM | POA: Diagnosis not present

## 2014-10-24 DIAGNOSIS — R52 Pain, unspecified: Secondary | ICD-10-CM | POA: Diagnosis not present

## 2014-10-24 DIAGNOSIS — D631 Anemia in chronic kidney disease: Secondary | ICD-10-CM | POA: Diagnosis not present

## 2014-10-24 DIAGNOSIS — E876 Hypokalemia: Secondary | ICD-10-CM | POA: Diagnosis not present

## 2014-10-26 DIAGNOSIS — R52 Pain, unspecified: Secondary | ICD-10-CM | POA: Diagnosis not present

## 2014-10-26 DIAGNOSIS — N186 End stage renal disease: Secondary | ICD-10-CM | POA: Diagnosis not present

## 2014-10-26 DIAGNOSIS — N2581 Secondary hyperparathyroidism of renal origin: Secondary | ICD-10-CM | POA: Diagnosis not present

## 2014-10-26 DIAGNOSIS — D689 Coagulation defect, unspecified: Secondary | ICD-10-CM | POA: Diagnosis not present

## 2014-10-26 DIAGNOSIS — E876 Hypokalemia: Secondary | ICD-10-CM | POA: Diagnosis not present

## 2014-10-26 DIAGNOSIS — Z4901 Encounter for fitting and adjustment of extracorporeal dialysis catheter: Secondary | ICD-10-CM | POA: Diagnosis not present

## 2014-10-26 DIAGNOSIS — D631 Anemia in chronic kidney disease: Secondary | ICD-10-CM | POA: Diagnosis not present

## 2014-10-27 ENCOUNTER — Encounter (HOSPITAL_COMMUNITY): Payer: Self-pay | Admitting: Emergency Medicine

## 2014-10-27 ENCOUNTER — Inpatient Hospital Stay (HOSPITAL_COMMUNITY)
Admission: EM | Admit: 2014-10-27 | Discharge: 2014-11-01 | DRG: 981 | Disposition: A | Discharge status: Skilled Nursing Facility | Payer: Medicare Other | Attending: Internal Medicine | Admitting: Internal Medicine

## 2014-10-27 DIAGNOSIS — J449 Chronic obstructive pulmonary disease, unspecified: Secondary | ICD-10-CM | POA: Diagnosis not present

## 2014-10-27 DIAGNOSIS — K529 Noninfective gastroenteritis and colitis, unspecified: Secondary | ICD-10-CM | POA: Diagnosis not present

## 2014-10-27 DIAGNOSIS — I480 Paroxysmal atrial fibrillation: Secondary | ICD-10-CM | POA: Diagnosis present

## 2014-10-27 DIAGNOSIS — A047 Enterocolitis due to Clostridium difficile: Principal | ICD-10-CM | POA: Diagnosis present

## 2014-10-27 DIAGNOSIS — R262 Difficulty in walking, not elsewhere classified: Secondary | ICD-10-CM | POA: Diagnosis not present

## 2014-10-27 DIAGNOSIS — D649 Anemia, unspecified: Secondary | ICD-10-CM | POA: Diagnosis not present

## 2014-10-27 DIAGNOSIS — K8020 Calculus of gallbladder without cholecystitis without obstruction: Secondary | ICD-10-CM | POA: Diagnosis not present

## 2014-10-27 DIAGNOSIS — Z681 Body mass index (BMI) 19 or less, adult: Secondary | ICD-10-CM

## 2014-10-27 DIAGNOSIS — E872 Acidosis: Secondary | ICD-10-CM | POA: Diagnosis not present

## 2014-10-27 DIAGNOSIS — D631 Anemia in chronic kidney disease: Secondary | ICD-10-CM | POA: Diagnosis not present

## 2014-10-27 DIAGNOSIS — T8249XA Other complication of vascular dialysis catheter, initial encounter: Secondary | ICD-10-CM | POA: Diagnosis not present

## 2014-10-27 DIAGNOSIS — K219 Gastro-esophageal reflux disease without esophagitis: Secondary | ICD-10-CM | POA: Diagnosis present

## 2014-10-27 DIAGNOSIS — E440 Moderate protein-calorie malnutrition: Secondary | ICD-10-CM | POA: Diagnosis not present

## 2014-10-27 DIAGNOSIS — M6281 Muscle weakness (generalized): Secondary | ICD-10-CM | POA: Diagnosis not present

## 2014-10-27 DIAGNOSIS — Z87891 Personal history of nicotine dependence: Secondary | ICD-10-CM

## 2014-10-27 DIAGNOSIS — Z992 Dependence on renal dialysis: Secondary | ICD-10-CM | POA: Diagnosis not present

## 2014-10-27 DIAGNOSIS — N390 Urinary tract infection, site not specified: Secondary | ICD-10-CM | POA: Diagnosis not present

## 2014-10-27 DIAGNOSIS — R197 Diarrhea, unspecified: Secondary | ICD-10-CM | POA: Diagnosis not present

## 2014-10-27 DIAGNOSIS — I120 Hypertensive chronic kidney disease with stage 5 chronic kidney disease or end stage renal disease: Secondary | ICD-10-CM | POA: Diagnosis present

## 2014-10-27 DIAGNOSIS — R410 Disorientation, unspecified: Secondary | ICD-10-CM | POA: Diagnosis not present

## 2014-10-27 DIAGNOSIS — N2581 Secondary hyperparathyroidism of renal origin: Secondary | ICD-10-CM | POA: Diagnosis present

## 2014-10-27 DIAGNOSIS — Z7902 Long term (current) use of antithrombotics/antiplatelets: Secondary | ICD-10-CM

## 2014-10-27 DIAGNOSIS — R0602 Shortness of breath: Secondary | ICD-10-CM | POA: Diagnosis not present

## 2014-10-27 DIAGNOSIS — Z7982 Long term (current) use of aspirin: Secondary | ICD-10-CM

## 2014-10-27 DIAGNOSIS — E785 Hyperlipidemia, unspecified: Secondary | ICD-10-CM | POA: Diagnosis present

## 2014-10-27 DIAGNOSIS — Z720 Tobacco use: Secondary | ICD-10-CM | POA: Diagnosis present

## 2014-10-27 DIAGNOSIS — N186 End stage renal disease: Secondary | ICD-10-CM | POA: Diagnosis present

## 2014-10-27 DIAGNOSIS — E43 Unspecified severe protein-calorie malnutrition: Secondary | ICD-10-CM | POA: Diagnosis present

## 2014-10-27 DIAGNOSIS — A0472 Enterocolitis due to Clostridium difficile, not specified as recurrent: Secondary | ICD-10-CM | POA: Diagnosis present

## 2014-10-27 DIAGNOSIS — R918 Other nonspecific abnormal finding of lung field: Secondary | ICD-10-CM | POA: Diagnosis not present

## 2014-10-27 DIAGNOSIS — I2510 Atherosclerotic heart disease of native coronary artery without angina pectoris: Secondary | ICD-10-CM | POA: Diagnosis present

## 2014-10-27 DIAGNOSIS — I509 Heart failure, unspecified: Secondary | ICD-10-CM | POA: Diagnosis present

## 2014-10-27 LAB — COMPREHENSIVE METABOLIC PANEL
ALBUMIN: 2.9 g/dL — AB (ref 3.5–5.2)
ALK PHOS: 58 U/L (ref 39–117)
ALT: 11 U/L (ref 0–35)
ALT: 12 U/L (ref 0–35)
ANION GAP: 17 — AB (ref 5–15)
AST: 32 U/L (ref 0–37)
AST: 53 U/L — ABNORMAL HIGH (ref 0–37)
Albumin: 3.1 g/dL — ABNORMAL LOW (ref 3.5–5.2)
Alkaline Phosphatase: 53 U/L (ref 39–117)
Anion gap: 18 — ABNORMAL HIGH (ref 5–15)
BUN: 34 mg/dL — ABNORMAL HIGH (ref 6–23)
BUN: 35 mg/dL — ABNORMAL HIGH (ref 6–23)
CHLORIDE: 104 mmol/L (ref 96–112)
CO2: 19 mmol/L (ref 19–32)
CO2: 18 mmol/L — ABNORMAL LOW (ref 19–32)
CREATININE: 9.09 mg/dL — AB (ref 0.50–1.10)
Calcium: 9.5 mg/dL (ref 8.4–10.5)
Calcium: 9.8 mg/dL (ref 8.4–10.5)
Chloride: 104 mmol/L (ref 96–112)
Creatinine, Ser: 9.13 mg/dL — ABNORMAL HIGH (ref 0.50–1.10)
GFR calc Af Amer: 4 mL/min — ABNORMAL LOW (ref >90)
GFR calc non Af Amer: 4 mL/min — ABNORMAL LOW (ref >90)
GFR, EST AFRICAN AMERICAN: 4 mL/min — AB (ref >90)
GFR, EST NON AFRICAN AMERICAN: 3 mL/min — AB (ref >90)
GLUCOSE: 79 mg/dL (ref 70–99)
GLUCOSE: 74 mg/dL (ref 70–99)
Potassium: 4 mmol/L (ref 3.5–5.1)
Potassium: 4.4 mmol/L (ref 3.5–5.1)
Sodium: 140 mmol/L (ref 135–145)
Sodium: 140 mmol/L (ref 135–145)
TOTAL PROTEIN: 7.1 g/dL (ref 6.0–8.3)
Total Bilirubin: 0.8 mg/dL (ref 0.3–1.2)
Total Bilirubin: 1 mg/dL (ref 0.3–1.2)
Total Protein: 6.6 g/dL (ref 6.0–8.3)

## 2014-10-27 LAB — CBC WITH DIFFERENTIAL/PLATELET
BASOS PCT: 0 % (ref 0–1)
Basophils Absolute: 0 10*3/uL (ref 0.0–0.1)
EOS ABS: 0 10*3/uL (ref 0.0–0.7)
EOS PCT: 0 % (ref 0–5)
HEMATOCRIT: 37 % (ref 36.0–46.0)
Hemoglobin: 12.3 g/dL (ref 12.0–15.0)
LYMPHS PCT: 6 % — AB (ref 12–46)
Lymphs Abs: 1.1 10*3/uL (ref 0.7–4.0)
MCH: 30.8 pg (ref 26.0–34.0)
MCHC: 33.2 g/dL (ref 30.0–36.0)
MCV: 92.7 fL (ref 78.0–100.0)
Monocytes Absolute: 1.4 10*3/uL — ABNORMAL HIGH (ref 0.1–1.0)
Monocytes Relative: 8 % (ref 3–12)
Neutro Abs: 15.2 10*3/uL — ABNORMAL HIGH (ref 1.7–7.7)
Neutrophils Relative %: 86 % — ABNORMAL HIGH (ref 43–77)
Platelets: 145 10*3/uL — ABNORMAL LOW (ref 150–400)
RBC: 3.99 MIL/uL (ref 3.87–5.11)
RDW: 20 % — ABNORMAL HIGH (ref 11.5–15.5)
WBC: 17.7 10*3/uL — ABNORMAL HIGH (ref 4.0–10.5)

## 2014-10-27 MED ORDER — BUDESONIDE 0.5 MG/2ML IN SUSP
0.5000 mg | Freq: Two times a day (BID) | RESPIRATORY_TRACT | Status: DC
  Administered 2014-10-28 – 2014-11-01 (×7): 0.5 mg via RESPIRATORY_TRACT
  Filled 2014-10-27 (×11): qty 2

## 2014-10-27 MED ORDER — ASPIRIN EC 81 MG PO TBEC
81.0000 mg | DELAYED_RELEASE_TABLET | Freq: Every day | ORAL | Status: DC
  Administered 2014-10-28 – 2014-11-01 (×5): 81 mg via ORAL
  Filled 2014-10-27 (×5): qty 1

## 2014-10-27 MED ORDER — CLOPIDOGREL BISULFATE 75 MG PO TABS
75.0000 mg | ORAL_TABLET | Freq: Every day | ORAL | Status: DC
  Administered 2014-10-28 – 2014-11-01 (×5): 75 mg via ORAL
  Filled 2014-10-27 (×5): qty 1

## 2014-10-27 MED ORDER — ROFLUMILAST 500 MCG PO TABS
500.0000 ug | ORAL_TABLET | Freq: Every day | ORAL | Status: DC
  Administered 2014-10-28 – 2014-11-01 (×4): 500 ug via ORAL
  Filled 2014-10-27 (×5): qty 1

## 2014-10-27 MED ORDER — AMIODARONE HCL 200 MG PO TABS
200.0000 mg | ORAL_TABLET | Freq: Every day | ORAL | Status: DC
  Administered 2014-10-29 – 2014-11-01 (×3): 200 mg via ORAL
  Filled 2014-10-27 (×5): qty 1

## 2014-10-27 MED ORDER — COLCHICINE 0.6 MG PO TABS
0.3000 mg | ORAL_TABLET | ORAL | Status: DC
  Administered 2014-10-31: 0.3 mg via ORAL
  Filled 2014-10-27: qty 0.5

## 2014-10-27 MED ORDER — FESOTERODINE FUMARATE ER 8 MG PO TB24
8.0000 mg | ORAL_TABLET | Freq: Every day | ORAL | Status: DC
  Administered 2014-10-28 – 2014-11-01 (×5): 8 mg via ORAL
  Filled 2014-10-27 (×5): qty 1

## 2014-10-27 MED ORDER — TIOTROPIUM BROMIDE MONOHYDRATE 18 MCG IN CAPS
18.0000 ug | ORAL_CAPSULE | Freq: Every day | RESPIRATORY_TRACT | Status: DC
  Filled 2014-10-27: qty 5

## 2014-10-27 MED ORDER — MIDODRINE HCL 5 MG PO TABS
10.0000 mg | ORAL_TABLET | Freq: Two times a day (BID) | ORAL | Status: DC
  Administered 2014-10-28 – 2014-11-01 (×9): 10 mg via ORAL
  Filled 2014-10-27 (×11): qty 2

## 2014-10-27 MED ORDER — SODIUM CHLORIDE 0.9 % IV SOLN
INTRAVENOUS | Status: DC
  Administered 2014-10-28 (×2): via INTRAVENOUS

## 2014-10-27 MED ORDER — THEOPHYLLINE ER 100 MG PO TB12
100.0000 mg | ORAL_TABLET | Freq: Two times a day (BID) | ORAL | Status: DC
  Administered 2014-10-28 – 2014-11-01 (×10): 100 mg via ORAL
  Filled 2014-10-27 (×13): qty 1

## 2014-10-27 MED ORDER — ARFORMOTEROL TARTRATE 15 MCG/2ML IN NEBU
15.0000 ug | INHALATION_SOLUTION | Freq: Two times a day (BID) | RESPIRATORY_TRACT | Status: DC
  Administered 2014-10-28 – 2014-11-01 (×8): 15 ug via RESPIRATORY_TRACT
  Filled 2014-10-27 (×11): qty 2

## 2014-10-27 MED ORDER — PANTOPRAZOLE SODIUM 40 MG PO TBEC
40.0000 mg | DELAYED_RELEASE_TABLET | Freq: Every day | ORAL | Status: DC
  Administered 2014-10-28 – 2014-10-30 (×3): 40 mg via ORAL
  Filled 2014-10-27 (×3): qty 1

## 2014-10-27 MED ORDER — SODIUM CHLORIDE 0.9 % IV SOLN
Freq: Once | INTRAVENOUS | Status: AC
  Administered 2014-10-27: 22:00:00 via INTRAVENOUS

## 2014-10-27 MED ORDER — METRONIDAZOLE IN NACL 5-0.79 MG/ML-% IV SOLN
500.0000 mg | Freq: Once | INTRAVENOUS | Status: AC
  Administered 2014-10-27: 500 mg via INTRAVENOUS
  Filled 2014-10-27: qty 100

## 2014-10-27 MED ORDER — ATORVASTATIN CALCIUM 20 MG PO TABS
20.0000 mg | ORAL_TABLET | Freq: Every day | ORAL | Status: DC
  Administered 2014-10-28 – 2014-11-01 (×5): 20 mg via ORAL
  Filled 2014-10-27 (×5): qty 1

## 2014-10-27 MED ORDER — RENA-VITE PO TABS
1.0000 | ORAL_TABLET | Freq: Every day | ORAL | Status: DC
  Administered 2014-10-28 – 2014-10-30 (×4): 1 via ORAL
  Filled 2014-10-27 (×6): qty 1

## 2014-10-27 MED ORDER — ALBUTEROL SULFATE (2.5 MG/3ML) 0.083% IN NEBU: 2.5000 mg | INHALATION_SOLUTION | Freq: Four times a day (QID) | RESPIRATORY_TRACT | Status: DC | PRN

## 2014-10-27 MED ORDER — CIPROFLOXACIN IN D5W 400 MG/200ML IV SOLN
400.0000 mg | Freq: Two times a day (BID) | INTRAVENOUS | Status: DC
  Filled 2014-10-27: qty 200

## 2014-10-27 MED ORDER — METRONIDAZOLE IN NACL 5-0.79 MG/ML-% IV SOLN
500.0000 mg | Freq: Three times a day (TID) | INTRAVENOUS | Status: DC
  Administered 2014-10-28 (×3): 500 mg via INTRAVENOUS
  Filled 2014-10-27 (×5): qty 100

## 2014-10-27 MED ORDER — HEPARIN SODIUM (PORCINE) 5000 UNIT/ML IJ SOLN
5000.0000 [IU] | Freq: Three times a day (TID) | INTRAMUSCULAR | Status: DC
  Administered 2014-10-28 – 2014-10-31 (×9): 5000 [IU] via SUBCUTANEOUS
  Filled 2014-10-27 (×13): qty 1

## 2014-10-27 NOTE — H&P (Addendum)
Hospitalist Admission History and Physical  Patient name: [name] Medical record number: [id number] Date of birth: [birth year] Age: 81 y.o. Gender: female  Primary Care Provider: [name],[name], MD  Chief Complaint: diarrhea  History of Present Illness:This is a 81 y.o. year old female with significant past medical history of ESRD on HD MWF, COPD, HTN, Afib  presenting with diarrhea. Pt reports 3 weeks of progressive diarrhea. No nausea, vomiting, fevers, chills. Pt denies any abd pain. Has been tolerating dialysis fairly well. Went to dialysis yesterday with minimal to mild weakness at the end of session. Diarrhea has been intermittently dark. Patient on sure if it is melanotic/black tarry. Last antibiotic use was during last admission November 2015 for encephalopathy and acute respiratory failure in the setting of UTI. No known sick contacts. Presents to the ER temperature 98.4, heart rate in the 80s to 90s, respirations and intense 20s, blood pressure in the 90s to 130s over 50s. Satting 100% on room air. White blood cell count 17.7, hemoglobin 12.3. Creatinine 9.09. Bicarbonate 18. Platelets 145. C. difficile obtained in the ER. CT of the abdomen and pelvis with oral contrast pending given palpable abdominal pain on exam in the ER. Started on IV Flagyl empirically.  Assessment and Plan: [name] PieriniRuth [name] is a 81 y.o. year old female presenting with diarrhea Active Problems:   Diarrhea   1-diarrhea -IV Cipro Flagyl empirically -Follow-up CT of the abdomen and pelvis -Stool studies including C. difficile, stool culture -Euvolemic to mildly dry on exam -Gently hydrate -Continue to follow -GI c/s as clinically indicated  2-COPD -No respiratory distress, hypoxia, wheezing -Stable on room air -Continue home regimen  3-ESRD -Hemodialysis Monday Wednesday Friday, -Noted mild metabolic acidosis in setting of ESRD and above -f/u lactate -Dialysis on schedule -Renal consult in  a.m.  4-atrial fibrillation -On amiodarone -Rate control currently -Pending EKG -Telemetry bed -Not on Coumadin  5-CHF -EF 60-65%, grade 1 diastolic dysfunction on 2D ECHO 07/2014 - euvolemic to dry on exam  -gently hydrate -follow -strict Is and Os and daily weights   FEN/GI: renal diet  Prophylaxis: sub q heparin  Disposition: pending further evaluation Code Status:Full Code    Patient Active Problem List   Diagnosis Date Noted  . Diarrhea 10/27/2014  . Tobacco abuse 08/05/2014  . Lung mass 08/03/2014  . Abnormal echocardiogram 08/02/2014  . ESRD on dialysis 07/27/2014  . A-fib 07/27/2014  . COPD (chronic obstructive pulmonary disease) 07/27/2014  . Chronic respiratory failure 07/27/2014  . Abdominal aneurysm without mention of rupture 10/08/2011  . AAA (abdominal aortic aneurysm) 09/26/2011  . DYSPNEA 10/11/2008  . Essential hypertension 07/12/2008  . EMPHYSEMA 07/12/2008  . COPD UNSPECIFIED 07/12/2008  . HEMOPTYSIS 07/12/2008   Past Medical History: Past Medical History  Diagnosis Date  . Hypertension   . Hyperlipidemia   . COPD (chronic obstructive pulmonary disease)   . Anemia   . ESRD (end stage renal disease) on dialysis   . CHF (congestive heart failure)   . GERD (gastroesophageal reflux disease)   . Constipation   . PEA (Pulseless electrical activity) 04/19/2012    PEA arrest complicated by hyperkalemia, PNA, anocic encephalopathy, VDRF    Past Surgical History: Past Surgical History  Procedure Laterality Date  . Abdominal hysterectomy    . Arteriovenous fistula  06/02/12    left radiocephalic - failed  . Arteriovenous graft placement  07/14/12    Created at [facility]    Social History: History   Social History  . Marital Status:  Widowed    Spouse Name: N/A    Number of Children: N/A  . Years of Education: N/A   Social History Main Topics  . Smoking status: Former Smoker    Types: Cigarettes    Quit date: 10/07/2006  . Smokeless  tobacco: None  . Alcohol Use: No  . Drug Use: No  . Sexual Activity: None   Other Topics Concern  . None   Social History Narrative    Family History: No family history on file.  Allergies: No Known Allergies  Current Facility-Administered Medications  Medication Dose Route Frequency Provider Last Rate Last Dose  . 0.9 %  sodium chloride infusion   Intravenous Continuous Doree Albee, MD      . albuterol (PROVENTIL) (2.5 MG/3ML) 0.083% nebulizer solution 2.5 mg  2.5 mg Nebulization Q6H PRN Doree Albee, MD      . Melene Muller ON 10/28/2014] amiodarone (PACERONE) tablet 200 mg  200 mg Oral Daily Doree Albee, MD      . Melene Muller ON 10/28/2014] arformoterol (BROVANA) nebulizer solution 15 mcg  15 mcg Nebulization BID Doree Albee, MD      . Melene Muller ON 10/28/2014] aspirin tablet 81 mg  81 mg Oral Daily Doree Albee, MD      . Melene Muller ON 10/28/2014] atorvastatin (LIPITOR) tablet 20 mg  20 mg Oral Daily Doree Albee, MD      . budesonide (PULMICORT) nebulizer solution 0.5 mg  0.5 mg Nebulization BID Doree Albee, MD      . ciprofloxacin (CIPRO) IVPB 400 mg  400 mg Intravenous Q12H Doree Albee, MD      . Melene Muller ON 10/28/2014] clopidogrel (PLAVIX) tablet 75 mg  75 mg Oral Daily Doree Albee, MD      . Melene Muller ON 10/31/2014] colchicine tablet 0.3 mg  0.3 mg Oral Once per day on Mon Thu Doree Albee, MD      . Melene Muller ON 10/28/2014] fesoterodine (TOVIAZ) tablet 8 mg  8 mg Oral Daily Doree Albee, MD      . heparin injection 5,000 Units  5,000 Units Subcutaneous 3 times per day Doree Albee, MD      . metroNIDAZOLE (FLAGYL) IVPB 500 mg  500 mg Intravenous Once Gerhard Munch, MD 100 mL/hr at 10/27/14 2309 500 mg at 10/27/14 2309  . metroNIDAZOLE (FLAGYL) IVPB 500 mg  500 mg Intravenous Q8H Doree Albee, MD      . Melene Muller ON 10/28/2014] midodrine (PROAMATINE) tablet 10 mg  10 mg Oral BID WC Doree Albee, MD      . Melene Muller ON 10/28/2014] multivitamin (RENA-VIT) tablet 1 tablet  1 tablet Oral Daily Doree Albee, MD      . Melene Muller ON 10/28/2014] pantoprazole (PROTONIX) EC tablet 40 mg  40 mg Oral Daily Doree Albee, MD      . Melene Muller ON 10/28/2014] roflumilast (DALIRESP) tablet 500 mcg  500 mcg Oral Daily Doree Albee, MD      . theophylline (THEODUR) 12 hr tablet 100 mg  100 mg Oral BID Doree Albee, MD      . Melene Muller ON 10/28/2014] tiotropium (SPIRIVA) inhalation capsule 18 mcg  18 mcg Inhalation Daily Doree Albee, MD       Current Outpatient Prescriptions  Medication Sig Dispense Refill  . albuterol (PROVENTIL) (2.5 MG/3ML) 0.083% nebulizer solution Take 2.5 mg by nebulization every 6 (six) hours as needed.    Marland Kitchen allopurinol (ZYLOPRIM) 100 MG tablet Take 100 mg by mouth daily.     Marland Kitchen amiodarone (PACERONE) 200 MG  tablet Take 1 tablet (200 mg total) by mouth daily. 30 tablet 1  . arformoterol (BROVANA) 15 MCG/2ML NEBU Take 15 mcg by nebulization 2 (two) times daily.    Marland Kitchen aspirin 81 MG tablet Take 81 mg by mouth daily.     Marland Kitchen atorvastatin (LIPITOR) 20 MG tablet Take 20 mg by mouth daily.    . budesonide (PULMICORT) 0.5 MG/2ML nebulizer solution Take 0.5 mg by nebulization 2 (two) times daily.    . clopidogrel (PLAVIX) 75 MG tablet Take 75 mg by mouth daily.    . colchicine 0.6 MG tablet Take 0.5 tablets (0.3 mg total) by mouth 2 (two) times a week. 8 tablet 1  . DALIRESP 500 MCG TABS tablet Take 500 mcg by mouth daily.     Marland Kitchen DETROL LA 4 MG 24 hr capsule Take 4 mg by mouth daily.     . midodrine (PROAMATINE) 10 MG tablet Take 1 tablet (10 mg total) by mouth 2 (two) times daily with a meal. 60 tablet 1  . multivitamin (RENA-VIT) TABS tablet Take 1 tablet by mouth daily.    . pantoprazole (PROTONIX) 40 MG tablet Take 40 mg by mouth daily.    Marland Kitchen senna (SENOKOT) 8.6 MG TABS Take 1 tablet by mouth daily.    . theophylline (THEODUR) 100 MG 12 hr tablet Take 100 mg by mouth 2 (two) times daily.    Marland Kitchen tiotropium (SPIRIVA) 18 MCG inhalation capsule Place 18 mcg into inhaler and inhale daily.     Review Of  Systems: 12 point ROS negative except as noted above in HPI.  Physical Exam: Filed Vitals:   10/27/14 2245  BP: 133/50  Pulse: 87  Temp:   Resp: 22    General: alert, cooperative and cachectic HEENT: PERRLA and extra ocular movement intact Heart: S1, S2 normal, no murmur, rub or gallop, regular rate and rhythm Lungs: clear to auscultation, no wheezes or rales and unlabored breathing Abdomen: + bowel sounds, + mild generalized abd TTP Extremities: extremities normal, atraumatic, no cyanosis or edema Skin:no rashes, no ecchymoses Neurology: normal without focal findings  Labs and Imaging: Lab Results  Component Value Date/Time   NA 140 10/27/2014 09:16 PM   K 4.4 10/27/2014 09:16 PM   CL 104 10/27/2014 09:16 PM   CO2 18* 10/27/2014 09:16 PM   BUN 35* 10/27/2014 09:16 PM   CREATININE 9.09* 10/27/2014 09:16 PM   CREATININE 1.69* 09/26/2011 11:29 AM   GLUCOSE 74 10/27/2014 09:16 PM   Lab Results  Component Value Date   WBC 17.7* 10/27/2014   HGB 12.3 10/27/2014   HCT 37.0 10/27/2014   MCV 92.7 10/27/2014   PLT 145* 10/27/2014    No results found.         Doree Albee MD  Pager: [phone]

## 2014-10-27 NOTE — ED Notes (Signed)
Lab called and stated they need a re-draw of patient CBC

## 2014-10-27 NOTE — ED Notes (Signed)
Pt. reports persistent diarrhea for several weeks with fatigue , poor appetite and generalized weakness advised by her PCP to go to ER for evaluation . Denies fever or chills.

## 2014-10-27 NOTE — ED Provider Notes (Addendum)
CSN: [id number]     Arrival date & time 10/27/14  1926 History   First MD Initiated Contact with Patient 10/27/14 2054     Chief Complaint  Patient presents with  . Diarrhea     (Consider location/radiation/quality/duration/timing/severity/associated sxs/prior Treatment) HPI Patient is concerned weakness, diarrhea, anorexia. She has multiple medical issues, but it seems as though over the past 3 weeks, as the patient has developed increasingly frequent diarrhea she has had concurrent worsening anorexia, and fatigue with listlessness. Today, the patient was too weak to perform typical ADL. Patient spoke with her physician, was referred here for further evaluation and management. She acknowledges a history of dialysis, was last dialyzed yesterday. She also acknowledges a history of prior C. difficile colitis.  She was treated for this during the hospitalization in November, 2015.    Past Medical History  Diagnosis Date  . Hypertension   . Hyperlipidemia   . COPD (chronic obstructive pulmonary disease)   . Anemia   . ESRD (end stage renal disease) on dialysis   . CHF (congestive heart failure)   . GERD (gastroesophageal reflux disease)   . Constipation   . PEA (Pulseless electrical activity) 04/19/2012    PEA arrest complicated by hyperkalemia, PNA, anocic encephalopathy, VDRF   Past Surgical History  Procedure Laterality Date  . Abdominal hysterectomy    . Arteriovenous fistula  06/02/12    left radiocephalic - failed  . Arteriovenous graft placement  07/14/12    Created at [facility]   No family history on file. History  Substance Use Topics  . Smoking status: Former Smoker    Types: Cigarettes    Quit date: 10/07/2006  . Smokeless tobacco: Not on file  . Alcohol Use: No   OB History    No data available     Review of Systems  Constitutional:       Per HPI, otherwise negative  HENT:       Per HPI, otherwise negative  Respiratory:       Per HPI, otherwise negative    Cardiovascular:       Per HPI, otherwise negative  Gastrointestinal: Positive for nausea, abdominal pain and diarrhea. Negative for vomiting.       Unsettled abdominal discomfort  Endocrine:       Negative aside from HPI  Genitourinary:       Neg aside from HPI   Musculoskeletal:       Per HPI, otherwise negative  Skin: Negative.   Neurological: Negative for syncope.      Allergies  Review of patient's allergies indicates no known allergies.  Home Medications   Prior to Admission medications   Medication Sig Start Date End Date Taking? Authorizing Provider  albuterol (PROVENTIL) (2.5 MG/3ML) 0.083% nebulizer solution Take 2.5 mg by nebulization every 6 (six) hours as needed.    Historical Provider, MD  allopurinol (ZYLOPRIM) 100 MG tablet Take 100 mg by mouth daily.  09/05/11   Historical Provider, MD  amiodarone (PACERONE) 200 MG tablet Take 1 tablet (200 mg total) by mouth daily. 08/05/14   Hollice Espy, MD  arformoterol (BROVANA) 15 MCG/2ML NEBU Take 15 mcg by nebulization 2 (two) times daily.    Historical Provider, MD  aspirin 81 MG tablet Take 81 mg by mouth daily.     Historical Provider, MD  atorvastatin (LIPITOR) 20 MG tablet Take 20 mg by mouth daily.    Historical Provider, MD  budesonide (PULMICORT) 0.5 MG/2ML nebulizer solution Take 0.5 mg  by nebulization 2 (two) times daily.    Historical Provider, MD  clopidogrel (PLAVIX) 75 MG tablet Take 75 mg by mouth daily.    Historical Provider, MD  colchicine 0.6 MG tablet Take 0.5 tablets (0.3 mg total) by mouth 2 (two) times a week. 08/05/14   Hollice Espy, MD  DALIRESP 500 MCG TABS tablet Take 500 mcg by mouth daily.  08/30/11   Historical Provider, MD  DETROL LA 4 MG 24 hr capsule Take 4 mg by mouth daily.  09/10/11   Historical Provider, MD  midodrine (PROAMATINE) 10 MG tablet Take 1 tablet (10 mg total) by mouth 2 (two) times daily with a meal. 08/05/14   Hollice Espy, MD  multivitamin (RENA-VIT) TABS  tablet Take 1 tablet by mouth daily.    Historical Provider, MD  pantoprazole (PROTONIX) 40 MG tablet Take 40 mg by mouth daily.    Historical Provider, MD  senna (SENOKOT) 8.6 MG TABS Take 1 tablet by mouth daily.    Historical Provider, MD  theophylline (THEODUR) 100 MG 12 hr tablet Take 100 mg by mouth 2 (two) times daily.    Historical Provider, MD  tiotropium (SPIRIVA) 18 MCG inhalation capsule Place 18 mcg into inhaler and inhale daily.    Historical Provider, MD   BP 123/62 mmHg  Pulse 91  Temp(Src) 98.4 F (36.9 C) (Oral)  Resp 28  Ht  (1.651 m)  Wt 129 lb (58.514 kg)  BMI 21.47 kg/m2  SpO2 100% Physical Exam  Constitutional: She is oriented to person, place, and time. She appears well-developed and well-nourished. She has a sickly appearance. No distress.  HENT:  Head: Normocephalic and atraumatic.  Eyes: Conjunctivae and EOM are normal.  Cardiovascular: Normal rate and regular rhythm.   Pulmonary/Chest: Effort normal and breath sounds normal. No stridor. No respiratory distress.    Abdominal: She exhibits no distension.    Musculoskeletal: She exhibits no edema.  Neurological: She is alert and oriented to person, place, and time. No cranial nerve deficit.  Skin: Skin is warm and dry.  Psychiatric: She has a normal mood and affect.  Nursing note and vitals reviewed.   ED Course  Procedures (including critical care time) Labs Review Labs Reviewed  COMPREHENSIVE METABOLIC PANEL - Abnormal; Notable for the following:    BUN 34 (*)    Creatinine, Ser 9.13 (*)    Albumin 2.9 (*)    GFR calc non Af Amer 3 (*)    GFR calc Af Amer 4 (*)    Anion gap 17 (*)    All other components within normal limits  CBC WITH DIFFERENTIAL/PLATELET - Abnormal; Notable for the following:    WBC 17.7 (*)    RDW 20.0 (*)    Platelets 145 (*)    Neutrophils Relative % 86 (*)    Neutro Abs 15.2 (*)    Lymphocytes Relative 6 (*)    Monocytes Absolute 1.4 (*)    All other  components within normal limits  COMPREHENSIVE METABOLIC PANEL - Abnormal; Notable for the following:    CO2 18 (*)    BUN 35 (*)    Creatinine, Ser 9.09 (*)    Albumin 3.1 (*)    AST 53 (*)    GFR calc non Af Amer 4 (*)    GFR calc Af Amer 4 (*)    Anion gap 18 (*)    All other components within normal limits  CLOSTRIDIUM DIFFICILE BY PCR  CBC WITH  DIFFERENTIAL/PLATELET    Patient's labs notable for leukocytosis. Given her recent episode of C. difficile colitis, prescription of diarrhea approximately 10 times a day, patient will begin treatment for recurrence.  On repeat exam the patient voices understanding of all findings, intervention, admission.  Hospitalist requests CT, which is reasonable.  MDM  Patient is with diarrhea, anorexia.  Patient has recent episode of C. difficile colitis, and has substantial contact with the medical system. Patient was started on Flagyl, admitted for further evaluation, management or On admission PCR to confirm C. difficile was pending.  Gerhard Munchobert Dnaiel Voller, MD 10/27/14 [id number]  Gerhard Munchobert Veryl Winemiller, MD 10/27/14 [id number]

## 2014-10-28 ENCOUNTER — Encounter (HOSPITAL_COMMUNITY): Payer: Self-pay | Admitting: *Deleted

## 2014-10-28 ENCOUNTER — Inpatient Hospital Stay (HOSPITAL_COMMUNITY): Payer: Medicare Other

## 2014-10-28 DIAGNOSIS — R197 Diarrhea, unspecified: Secondary | ICD-10-CM

## 2014-10-28 DIAGNOSIS — A0472 Enterocolitis due to Clostridium difficile, not specified as recurrent: Secondary | ICD-10-CM | POA: Diagnosis present

## 2014-10-28 LAB — COMPREHENSIVE METABOLIC PANEL
ALK PHOS: 56 U/L (ref 39–117)
ALT: 11 U/L (ref 0–35)
ANION GAP: 14 (ref 5–15)
AST: 28 U/L (ref 0–37)
Albumin: 2.7 g/dL — ABNORMAL LOW (ref 3.5–5.2)
BUN: 37 mg/dL — ABNORMAL HIGH (ref 6–23)
CO2: 22 mmol/L (ref 19–32)
Calcium: 9 mg/dL (ref 8.4–10.5)
Chloride: 103 mmol/L (ref 96–112)
Creatinine, Ser: 9.22 mg/dL — ABNORMAL HIGH (ref 0.50–1.10)
GFR calc Af Amer: 4 mL/min — ABNORMAL LOW (ref >90)
GFR, EST NON AFRICAN AMERICAN: 3 mL/min — AB (ref >90)
Glucose, Bld: 74 mg/dL (ref 70–99)
Potassium: 3.6 mmol/L (ref 3.5–5.1)
SODIUM: 139 mmol/L (ref 135–145)
Total Bilirubin: 0.7 mg/dL (ref 0.3–1.2)
Total Protein: 6.4 g/dL (ref 6.0–8.3)

## 2014-10-28 LAB — CBC WITH DIFFERENTIAL/PLATELET
Basophils Absolute: 0 10*3/uL (ref 0.0–0.1)
Basophils Relative: 0 % (ref 0–1)
EOS ABS: 0 10*3/uL (ref 0.0–0.7)
EOS PCT: 0 % (ref 0–5)
HCT: 34 % — ABNORMAL LOW (ref 36.0–46.0)
Hemoglobin: 11.1 g/dL — ABNORMAL LOW (ref 12.0–15.0)
LYMPHS PCT: 4 % — AB (ref 12–46)
Lymphs Abs: 0.7 10*3/uL (ref 0.7–4.0)
MCH: 30.3 pg (ref 26.0–34.0)
MCHC: 32.6 g/dL (ref 30.0–36.0)
MCV: 92.9 fL (ref 78.0–100.0)
Monocytes Absolute: 1.5 10*3/uL — ABNORMAL HIGH (ref 0.1–1.0)
Monocytes Relative: 9 % (ref 3–12)
NEUTROS ABS: 13.5 10*3/uL — AB (ref 1.7–7.7)
Neutrophils Relative %: 87 % — ABNORMAL HIGH (ref 43–77)
Platelets: 162 10*3/uL (ref 150–400)
RBC: 3.66 MIL/uL — AB (ref 3.87–5.11)
RDW: 19.5 % — AB (ref 11.5–15.5)
WBC: 15.7 10*3/uL — ABNORMAL HIGH (ref 4.0–10.5)

## 2014-10-28 LAB — CBC
HEMATOCRIT: 32.5 % — AB (ref 36.0–46.0)
HEMOGLOBIN: 10.6 g/dL — AB (ref 12.0–15.0)
MCH: 30.4 pg (ref 26.0–34.0)
MCHC: 32.6 g/dL (ref 30.0–36.0)
MCV: 93.1 fL (ref 78.0–100.0)
PLATELETS: 159 10*3/uL (ref 150–400)
RBC: 3.49 MIL/uL — ABNORMAL LOW (ref 3.87–5.11)
RDW: 19.6 % — AB (ref 11.5–15.5)
WBC: 15.4 10*3/uL — AB (ref 4.0–10.5)

## 2014-10-28 LAB — CREATININE, SERUM
CREATININE: 9.31 mg/dL — AB (ref 0.50–1.10)
GFR calc Af Amer: 4 mL/min — ABNORMAL LOW (ref >90)
GFR calc non Af Amer: 3 mL/min — ABNORMAL LOW (ref >90)

## 2014-10-28 LAB — HEPATITIS B SURFACE ANTIGEN: HEP B S AG: NEGATIVE

## 2014-10-28 LAB — CLOSTRIDIUM DIFFICILE BY PCR: Toxigenic C. Difficile by PCR: POSITIVE — AB

## 2014-10-28 LAB — OCCULT BLOOD X 1 CARD TO LAB, STOOL: Fecal Occult Bld: NEGATIVE

## 2014-10-28 MED ORDER — HEPARIN SODIUM (PORCINE) 1000 UNIT/ML DIALYSIS: 3000.0000 [IU] | Freq: Once | INTRAMUSCULAR | Status: DC

## 2014-10-28 MED ORDER — ALTEPLASE 2 MG IJ SOLR
4.0000 mg | Freq: Once | INTRAMUSCULAR | Status: AC
  Administered 2014-10-28: 4 mg
  Filled 2014-10-28: qty 4

## 2014-10-28 MED ORDER — CEFTRIAXONE SODIUM IN DEXTROSE 20 MG/ML IV SOLN
1.0000 g | Freq: Every day | INTRAVENOUS | Status: DC
  Administered 2014-10-28: 1 g via INTRAVENOUS
  Filled 2014-10-28 (×2): qty 50

## 2014-10-28 MED ORDER — SODIUM CHLORIDE 0.9 % IV SOLN: 100.0000 mL | INTRAVENOUS | Status: DC | PRN

## 2014-10-28 MED ORDER — NEPRO/CARBSTEADY PO LIQD: 237.0000 mL | ORAL | Status: DC | PRN

## 2014-10-28 MED ORDER — SEVELAMER CARBONATE 800 MG PO TABS
1600.0000 mg | ORAL_TABLET | Freq: Three times a day (TID) | ORAL | Status: DC
  Administered 2014-10-29 – 2014-11-01 (×9): 1600 mg via ORAL
  Filled 2014-10-28 (×14): qty 2

## 2014-10-28 MED ORDER — CALCITRIOL 0.25 MCG PO CAPS
0.2500 ug | ORAL_CAPSULE | ORAL | Status: DC
  Administered 2014-10-28 – 2014-10-31 (×2): 0.25 ug via ORAL
  Filled 2014-10-28 (×2): qty 1

## 2014-10-28 MED ORDER — HEPARIN SODIUM (PORCINE) 1000 UNIT/ML DIALYSIS: 1000.0000 [IU] | INTRAMUSCULAR | Status: DC | PRN

## 2014-10-28 MED ORDER — ONDANSETRON HCL 4 MG/2ML IJ SOLN: 4.0000 mg | Freq: Four times a day (QID) | INTRAMUSCULAR | Status: DC | PRN

## 2014-10-28 MED ORDER — MIDODRINE HCL 5 MG PO TABS
ORAL_TABLET | ORAL | Status: AC
  Filled 2014-10-28: qty 2

## 2014-10-28 MED ORDER — BOOST / RESOURCE BREEZE PO LIQD
1.0000 | Freq: Two times a day (BID) | ORAL | Status: DC
  Administered 2014-10-29 – 2014-11-01 (×6): 1 via ORAL

## 2014-10-28 MED ORDER — ALTEPLASE 2 MG IJ SOLR
2.0000 mg | Freq: Once | INTRAMUSCULAR | Status: DC | PRN
  Filled 2014-10-28: qty 2

## 2014-10-28 MED ORDER — LIDOCAINE-PRILOCAINE 2.5-2.5 % EX CREA: 1.0000 "application " | TOPICAL_CREAM | CUTANEOUS | Status: DC | PRN

## 2014-10-28 MED ORDER — VANCOMYCIN 50 MG/ML ORAL SOLUTION
125.0000 mg | Freq: Four times a day (QID) | ORAL | Status: DC
  Administered 2014-10-28 – 2014-11-01 (×14): 125 mg via ORAL
  Filled 2014-10-28 (×19): qty 2.5

## 2014-10-28 MED ORDER — PENTAFLUOROPROP-TETRAFLUOROETH EX AERO: 1.0000 "application " | INHALATION_SPRAY | CUTANEOUS | Status: DC | PRN

## 2014-10-28 MED ORDER — LIDOCAINE HCL (PF) 1 % IJ SOLN: 5.0000 mL | INTRAMUSCULAR | Status: DC | PRN

## 2014-10-28 NOTE — Progress Notes (Signed)
[facility] PROGRESS NOTE   [name] PieriniRuth Rosiak YNW:[id number] DOB: 26-Dec-1928 DOA: 10/27/2014 PCP: [name],[name], MD  HPI/Subjective: Still has a lot of diarrhea, complaining about some nausea. Patient seen and examined earlier today by my colleague Dr. Alvester MorinNewton, patient seen and examined as stated below. This is a no charge note  Assessment/Plan: Active Problems:   Diarrhea   C. difficile colitis Will be treated as severe C. difficile colitis, with baseline creatinine above 1.5 and WBCs more than 15K on admission. Started on Flagyl in the ED, discontinued. Started on oral vancomycin.  Code Status: Full Code Family Communication: Plan discussed with the patient. Disposition Plan: Remains inpatient Diet: Diet renal W/124100mL fluid restriction  Consultants:  Nephrology  Procedures:  Dialysis on Monday/Wednesday/Friday  Antibiotics:  Oral vancomycin   Objective: Filed Vitals:   10/28/14 0613  BP: 129/50  Pulse: 83  Temp: 98.4 F (36.9 C)  Resp: 18    Intake/Output Summary (Last 24 hours) at 10/28/14 1309 Last data filed at 10/28/14 0046  Gross per 24 hour  Intake    200 ml  Output      0 ml  Net    200 ml   Filed Weights   10/27/14 1932 10/28/14 0129  Weight: 58.514 kg (129 lb) 54.233 kg (119 lb 9 oz)    Exam: General: Alert and awake, oriented x3, not in any acute distress. HEENT: anicteric sclera, pupils reactive to light and accommodation, EOMI CVS: S1-S2 clear, no murmur rubs or gallops Chest: clear to auscultation bilaterally, no wheezing, rales or rhonchi Abdomen: soft nontender, nondistended, normal bowel sounds, no organomegaly Extremities: no cyanosis, clubbing or edema noted bilaterally Neuro: Cranial nerves II-XII intact, no focal neurological deficits  Data Reviewed: Basic Metabolic Panel:  Recent Labs Lab 10/27/14 0029 10/27/14 1939 10/27/14 2116 10/28/14 0637  NA  --  140 140 139  K  --  4.0 4.4 3.6  CL  --  104 104 103  CO2   --  19 18* 22  GLUCOSE  --  79 74 74  BUN  --  34* 35* 37*  CREATININE 9.31* 9.13* 9.09* 9.22*  CALCIUM  --  9.5 9.8 9.0   Liver Function Tests:  Recent Labs Lab 10/27/14 1939 10/27/14 2116 10/28/14 0637  AST 32 53* 28  ALT 11 12 11   ALKPHOS 53 58 56  BILITOT 0.8 1.0 0.7  PROT 6.6 7.1 6.4  ALBUMIN 2.9* 3.1* 2.7*   No results for input(s): LIPASE, AMYLASE in the last 168 hours. No results for input(s): AMMONIA in the last 168 hours. CBC:  Recent Labs Lab 10/27/14 0029 10/27/14 2116 10/28/14 0637  WBC 15.4* 17.7* 15.7*  NEUTROABS  --  15.2* 13.5*  HGB 10.6* 12.3 11.1*  HCT 32.5* 37.0 34.0*  MCV 93.1 92.7 92.9  PLT 159 145* 162   Cardiac Enzymes: No results for input(s): CKTOTAL, CKMB, CKMBINDEX, TROPONINI in the last 168 hours. BNP (last 3 results) No results for input(s): BNP in the last 8760 hours.  ProBNP (last 3 results) No results for input(s): PROBNP in the last 8760 hours.  CBG: No results for input(s): GLUCAP in the last 168 hours.  Micro Recent Results (from the past 240 hour(s))  Clostridium Difficile by PCR     Status: Abnormal   Collection Time: 10/27/14 11:46 PM  Result Value Ref Range Status   C difficile by pcr POSITIVE (A) NEGATIVE Final    Comment: CRITICAL RESULT CALLED TO, READ BACK BY AND VERIFIED WITH: J.  BOARD RN 10:55 10/28/14 ([email or web address])      Studies: Ct Abdomen Pelvis Wo Contrast  10/28/2014   CLINICAL DATA:  Diarrhea for 3 weeks.  Rule out colitis.  EXAM: CT ABDOMEN AND PELVIS WITHOUT CONTRAST  TECHNIQUE: Multidetector CT imaging of the abdomen and pelvis was performed following the standard protocol without IV contrast.  COMPARISON:  CT 10/02/2014  FINDINGS: Chronic change at the lung bases. The heart is enlarged. There is a significant tortuosity of the distal thoracic aorta that appears similar to prior exam. No pleural effusion.  There is colonic wall thickening extending from the mid descending colon through the rectosigmoid colon.  Associated pericolonic inflammatory change. Findings are consistent with colitis. No pneumatosis or perforation. Multiple descending and sigmoid colonic diverticula are again seen without peri-diverticular inflammatory change to suggest diverticulitis. There is no small bowel dilatation. The stomach is physiologically distended.  Layering gallstones within physiologically distended gallbladder. The unenhanced liver is unremarkable. The unenhanced spleen, pancreas, and adrenal glands are normal. There is renal atrophy without hydronephrosis. The previous right renal cyst is not well seen without contrast.  Large infrarenal abdominal aortic aneurysm appear similar to prior exam. This measures 7.2 x 6.2 cm in biaxial dimension, however differences in size are likely related to differences in technique. There is no periaortic soft tissue stranding to suggest rupture. Dense atherosclerosis throughout the abdominal aorta and its branches again seen. No free air, free fluid, or intra-abdominal fluid collection.  Within the pelvis the urinary bladder is decompressed not well evaluated. The uterus not well seen, atrophic versus surgically absent. There is no adnexal mass.  Anterolisthesis of L4 on L5 and L5 on S1 with associated degenerative change, stable. Stable degenerative change of the pubic symphysis. No acute osseous abnormalities.  IMPRESSION: 1. Colitis involving the descending and sigmoid colon. Findings suggest an infectious or inflammatory etiology. There is diverticulosis throughout the colon without peri-diverticular inflammatory change to suggest diverticulitis. 2. Stable chronic findings include significant infrarenal abdominal aortic aneurysm. Current maximal dimension of 7.2 cm is minimally increased compared to prior where it measured 6.9 cm, however this is likely related to differences in technique. 3. Cholelithiasis.   Electronically Signed   By: Rubye Oaks M.D.   On: 10/28/2014 05:59     Scheduled Meds: . amiodarone  200 mg Oral Daily  . arformoterol  15 mcg Nebulization BID  . aspirin EC  81 mg Oral Daily  . atorvastatin  20 mg Oral Daily  . budesonide  0.5 mg Nebulization BID  . cefTRIAXone (ROCEPHIN)  IV  1 g Intravenous QHS  . clopidogrel  75 mg Oral Daily  . [START ON 10/31/2014] colchicine  0.3 mg Oral Once per day on Mon Thu  . fesoterodine  8 mg Oral Daily  . heparin  5,000 Units Subcutaneous 3 times per day  . midodrine  10 mg Oral BID WC  . multivitamin  1 tablet Oral QHS  . pantoprazole  40 mg Oral Daily  . roflumilast  500 mcg Oral Daily  . theophylline  100 mg Oral BID  . tiotropium  18 mcg Inhalation Daily  . vancomycin  125 mg Oral QID   Continuous Infusions: . sodium chloride 50 mL/hr at 10/28/14 1301       Time spent: 35 minutes    [facility]  [facility] Pager [phone] If 7PM-7AM, please contact night-coverage at [email or web address], password [facility] 10/28/2014, 1:09 PM  LOS: 1 day

## 2014-10-28 NOTE — Progress Notes (Signed)
Utilization review completed. Kyaire Gruenewald, RN, BSN. 

## 2014-10-28 NOTE — Procedures (Signed)
She is tolerating hemodialysis without any issues.  She is alert and appropriate.  We need to adjust EDW downward for weight loss. [name]

## 2014-10-28 NOTE — Consult Note (Signed)
Indication for Consultation:  Management of ESRD/hemodialysis; anemia, hypertension/volume and secondary hyperparathyroidism  HPI: [name] PieriniRuth Ladson is a 81 y.o. female admitted yesterday with complaints of diarrhea for about 2 weeks. She receives HD MWF @ [facility], Hx HTN, CHF, COPD. She reports she has been having diarrhea and poor appetite for 2 weeks with no improvement, she was sent to the ED by her PCP for evaluation. Denies fever/chills/abd pain/blood in stool. She denies any history of diarrhea like this in the past but was treated for cdiff in November, denies recent antibiotic use.  Cdiff was + yesterday- she was started on vanc and flagyl. Will arrange for HD today.   Past Medical History  Diagnosis Date  . Hypertension   . Hyperlipidemia   . COPD (chronic obstructive pulmonary disease)   . Anemia   . ESRD (end stage renal disease) on dialysis   . CHF (congestive heart failure)   . GERD (gastroesophageal reflux disease)   . Constipation   . PEA (Pulseless electrical activity) 04/19/2012    PEA arrest complicated by hyperkalemia, PNA, anocic encephalopathy, VDRF   Past Surgical History  Procedure Laterality Date  . Abdominal hysterectomy    . Arteriovenous fistula  06/02/12    left radiocephalic - failed  . Arteriovenous graft placement  07/14/12    Created at [facility]   No family history on file. Social History:  reports that she quit smoking about 8 years ago. Her smoking use included Cigarettes. She does not have any smokeless tobacco history on file. She reports that she does not drink alcohol or use illicit drugs. No Known Allergies Prior to Admission medications   Medication Sig Start Date End Date Taking? Authorizing Provider  albuterol (PROVENTIL) (2.5 MG/3ML) 0.083% nebulizer solution Take 2.5 mg by nebulization every 6 (six) hours as needed for wheezing or shortness of breath.    Yes Historical Provider, MD  allopurinol (ZYLOPRIM) 100 MG tablet Take 100 mg by mouth daily.   09/05/11  Yes Historical Provider, MD  amiodarone (PACERONE) 200 MG tablet Take 1 tablet (200 mg total) by mouth daily. 08/05/14  Yes Hollice EspySendil K Krishnan, MD  arformoterol (BROVANA) 15 MCG/2ML NEBU Take 15 mcg by nebulization 2 (two) times daily.   Yes Historical Provider, MD  aspirin 81 MG tablet Take 81 mg by mouth daily.    Yes Historical Provider, MD  budesonide (PULMICORT) 0.5 MG/2ML nebulizer solution Take 0.5 mg by nebulization 2 (two) times daily.   Yes Historical Provider, MD  midodrine (PROAMATINE) 10 MG tablet Take 1 tablet (10 mg total) by mouth 2 (two) times daily with a meal. 08/05/14  Yes Hollice EspySendil K Krishnan, MD  multivitamin (RENA-VIT) TABS tablet Take 1 tablet by mouth daily.   Yes Historical Provider, MD  pantoprazole (PROTONIX) 40 MG tablet Take 40 mg by mouth daily.   Yes Historical Provider, MD  senna (SENOKOT) 8.6 MG TABS Take 1 tablet by mouth daily.   Yes Historical Provider, MD  theophylline (THEODUR) 100 MG 12 hr tablet Take 100 mg by mouth 2 (two) times daily.   Yes Historical Provider, MD   Current Facility-Administered Medications  Medication Dose Route Frequency Provider Last Rate Last Dose  . 0.9 %  sodium chloride infusion   Intravenous Continuous Doree AlbeeSteven Newton, MD 50 mL/hr at 10/28/14 1301    . albuterol (PROVENTIL) (2.5 MG/3ML) 0.083% nebulizer solution 2.5 mg  2.5 mg Nebulization Q6H PRN Doree AlbeeSteven Newton, MD      . amiodarone (PACERONE) tablet 200 mg  200 mg Oral Daily Doree Albee, MD      . arformoterol [facility]) nebulizer solution 15 mcg  15 mcg Nebulization BID Doree Albee, MD   15 mcg at 10/28/14 0906  . aspirin EC tablet 81 mg  81 mg Oral Daily Doree Albee, MD   81 mg at 10/28/14 1015  . atorvastatin (LIPITOR) tablet 20 mg  20 mg Oral Daily Doree Albee, MD   20 mg at 10/28/14 1014  . budesonide (PULMICORT) nebulizer solution 0.5 mg  0.5 mg Nebulization BID Doree Albee, MD   0.5 mg at 10/28/14 0906  . cefTRIAXone (ROCEPHIN) 1 g in dextrose 5 % 50 mL IVPB  - Premix  1 g Intravenous QHS Doree Albee, MD   1 g at 10/28/14 0452  . clopidogrel (PLAVIX) tablet 75 mg  75 mg Oral Daily Doree Albee, MD   75 mg at 10/28/14 1014  . [START ON 10/31/2014] colchicine tablet 0.3 mg  0.3 mg Oral Once per day on Mon Thu Doree Albee, MD      . fesoterodine Gala Murdoch) tablet 8 mg  8 mg Oral Daily Doree Albee, MD   8 mg at 10/28/14 1014  . heparin injection 5,000 Units  5,000 Units Subcutaneous 3 times per day Doree Albee, MD   5,000 Units at 10/28/14 1302  . midodrine (PROAMATINE) tablet 10 mg  10 mg Oral BID WC Doree Albee, MD   10 mg at 10/28/14 1014  . multivitamin (RENA-VIT) tablet 1 tablet  1 tablet Oral QHS Doree Albee, MD   1 tablet at 10/28/14 0210  . ondansetron (ZOFRAN) injection 4 mg  4 mg Intravenous Q6H PRN Clydia Llano, MD      . pantoprazole (PROTONIX) EC tablet 40 mg  40 mg Oral Daily Doree Albee, MD   40 mg at 10/28/14 1014  . roflumilast (DALIRESP) tablet 500 mcg  500 mcg Oral Daily Doree Albee, MD   500 mcg at 10/28/14 1014  . theophylline (THEODUR) 12 hr tablet 100 mg  100 mg Oral BID Doree Albee, MD   100 mg at 10/28/14 1014  . tiotropium (SPIRIVA) inhalation capsule 18 mcg  18 mcg Inhalation Daily Doree Albee, MD   18 mcg at 10/28/14 0906  . vancomycin (VANCOCIN) 50 mg/mL oral solution 125 mg  125 mg Oral QID Clydia Llano, MD       Labs: Basic Metabolic Panel:  Recent Labs Lab 10/27/14 1939 10/27/14 2116 10/28/14 0637  NA 140 140 139  K 4.0 4.4 3.6  CL 104 104 103  CO2 19 18* 22  GLUCOSE 79 74 74  BUN 34* 35* 37*  CREATININE 9.13* 9.09* 9.22*  CALCIUM 9.5 9.8 9.0   Liver Function Tests:  Recent Labs Lab 10/27/14 1939 10/27/14 2116 10/28/14 0637  AST 32 53* 28  ALT 11 12 11   ALKPHOS 53 58 56  BILITOT 0.8 1.0 0.7  PROT 6.6 7.1 6.4  ALBUMIN 2.9* 3.1* 2.7*   No results for input(s): LIPASE, AMYLASE in the last 168 hours. No results for input(s): AMMONIA in the last 168 hours. CBC:  Recent Labs Lab  10/27/14 0029 10/27/14 2116 10/28/14 0637  WBC 15.4* 17.7* 15.7*  NEUTROABS  --  15.2* 13.5*  HGB 10.6* 12.3 11.1*  HCT 32.5* 37.0 34.0*  MCV 93.1 92.7 92.9  PLT 159 145* 162   Cardiac Enzymes: No results for input(s): CKTOTAL, CKMB, CKMBINDEX, TROPONINI in the last 168 hours. CBG: No results for input(s): GLUCAP in the last 168  hours. Iron Studies: No results for input(s): IRON, TIBC, TRANSFERRIN, FERRITIN in the last 72 hours. Studies/Results: Ct Abdomen Pelvis Wo Contrast  10/28/2014   CLINICAL DATA:  Diarrhea for 3 weeks.  Rule out colitis.  EXAM: CT ABDOMEN AND PELVIS WITHOUT CONTRAST  TECHNIQUE: Multidetector CT imaging of the abdomen and pelvis was performed following the standard protocol without IV contrast.  COMPARISON:  CT 10/02/2014  FINDINGS: Chronic change at the lung bases. The heart is enlarged. There is a significant tortuosity of the distal thoracic aorta that appears similar to prior exam. No pleural effusion.  There is colonic wall thickening extending from the mid descending colon through the rectosigmoid colon. Associated pericolonic inflammatory change. Findings are consistent with colitis. No pneumatosis or perforation. Multiple descending and sigmoid colonic diverticula are again seen without peri-diverticular inflammatory change to suggest diverticulitis. There is no small bowel dilatation. The stomach is physiologically distended.  Layering gallstones within physiologically distended gallbladder. The unenhanced liver is unremarkable. The unenhanced spleen, pancreas, and adrenal glands are normal. There is renal atrophy without hydronephrosis. The previous right renal cyst is not well seen without contrast.  Large infrarenal abdominal aortic aneurysm appear similar to prior exam. This measures 7.2 x 6.2 cm in biaxial dimension, however differences in size are likely related to differences in technique. There is no periaortic soft tissue stranding to suggest rupture. Dense  atherosclerosis throughout the abdominal aorta and its branches again seen. No free air, free fluid, or intra-abdominal fluid collection.  Within the pelvis the urinary bladder is decompressed not well evaluated. The uterus not well seen, atrophic versus surgically absent. There is no adnexal mass.  Anterolisthesis of L4 on L5 and L5 on S1 with associated degenerative change, stable. Stable degenerative change of the pubic symphysis. No acute osseous abnormalities.  IMPRESSION: 1. Colitis involving the descending and sigmoid colon. Findings suggest an infectious or inflammatory etiology. There is diverticulosis throughout the colon without peri-diverticular inflammatory change to suggest diverticulitis. 2. Stable chronic findings include significant infrarenal abdominal aortic aneurysm. Current maximal dimension of 7.2 cm is minimally increased compared to prior where it measured 6.9 cm, however this is likely related to differences in technique. 3. Cholelithiasis.   Electronically Signed   By: Rubye Oaks M.D.   On: 10/28/2014 05:59    Review of Systems: Gen: Reports poor appetite and weight loss. Denies any fever, chills, sweats HEENT: No visual complaints, No history of Retinopathy. Normal external appearance No Epistaxis or Sore throat. No sinusitis.   CV: Reports 'ankles swell' Denies chest pain, angina, palpitations, syncope, orthopnea, PND Resp: Denies dyspnea at rest, dyspnea with exercise, cough, sputum, wheezing, coughing up blood, and pleurisy. GI: Reports poor apptite and diarrhea x 2 weeks, 3 loose stools this am. Denies vomiting or abdominal pain, denies blood in stool GU : Denies urinary burning, blood in urine, urinary frequency, urinary hesitancy, nocturnal urination, and urinary incontinence.  No renal calculi. MS: Denies joint pain, limitation of movement, and swelling, stiffness, low back pain, extremity pain. Denies muscle weakness, cramps, atrophy.  No use of non steroidal  antiinflammatory drugs. Derm: Denies rash, itching, dry skin, hives, moles, warts, or unhealing ulcers.  Psych: Denies depression, anxiety, memory loss, suicidal ideation, hallucinations, paranoia, and confusion. Heme: Denies bruising, bleeding, and enlarged lymph nodes. Neuro: No headache.  No diplopia. No dysarthria.  No dysphasia.  No history of CVA.  No Seizures. No paresthesias.  No weakness. Endocrine No DM.  No Thyroid disease.  No Adrenal disease.  Physical Exam: Filed Vitals:   10/28/14 0122 10/28/14 0129 10/28/14 0613 10/28/14 0907  BP: 119/42 119/52 129/50   Pulse: 78 78 83   Temp: 98.1 F (36.7 C) 98.1 F (36.7 C) 98.4 F (36.9 C)   TempSrc: Oral Oral Oral   Resp: 18  18   Height:   (1.651 m)    Weight:  54.233 kg (119 lb 9 oz)    SpO2: 100% 100% 100% 100%     General: Well developed, well nourished, in no acute distress. Head: Normocephalic, atraumatic, sclera non-icteric, mucus membranes are moist Neck: Supple. JVD not elevated. Lungs: Clear bilaterally to auscultation without wheezes, rales, or rhonchi. Breathing is unlabored. Heart: RRR with S1 S2. No murmurs, rubs, or gallops appreciated. Abdomen: Soft, non-tender, non-distended with normoactive bowel sounds. No rebound/guarding. No obvious abdominal masses. M-S:  Strength and tone appear normal for age. Lower extremities: LLE +1 edema. RLE trace edema / no calf tenderness Neuro: Alert and oriented X 3. Moves all extremities spontaneously. Psych:  Responds to questions appropriately with a normal affect. Dialysis Access: cath R IJ  Dialysis Orders:  MWF [facility] 3hr 30 mins   57.5 kg   3K/2.25 ca    400/1.5    R IJ Cath   3000 u hep micera 75 q 2 weeks (next dose due 2/10) calcitirol 0.25  Assessment/Plan: 1.  cdiff- colitis on CT. management per primary. vanc and flagyl  2. afib- on amiodarone/ no coumadin 3.  ESRD -  MWF @ [facility], HD today. K+3.6 4.  Hypertension/volume  - 129/50 under edw/ will need lower at  DC- midodrine BID  5.  Anemia  - hgb 11.1  micera q 2 weeks, last dose 1/27- next dose 2/10. Watch CBC  No Fe- last tsat 53 6.  Metabolic bone disease -  Ca+9, cont calcitirol. Last phos 6.8 adn PTH 33(calcitirol was decreased from 1 at this time) cont renvela 7.  Nutrition - alb 2.7. Reports poor appetite. renal diet. renal vit  Jetty Duhamel, NP [facility] Beeper [phone] 10/28/2014, 2:02 PM    Renal Attending: C diff colitis now on PO Vanc. Clinically she is stable.  Dialysis to be done today.  Weight down and we need to adjust for weight loss. [name] C  C diff colitis confirmed and receiving treatmen

## 2014-10-28 NOTE — Progress Notes (Signed)
INITIAL NUTRITION ASSESSMENT  DOCUMENTATION CODES Per approved criteria  -Severe malnutrition in the context of chronic illness   Pt meets criteria for severe MALNUTRITION in the context of chronic illness as evidenced by severe fat and muscle depletion, 11% wt loss x 3 months.  INTERVENTION: Resource Breeze po BID, each supplement provides 250 kcal and 9 grams of protein  NUTRITION DIAGNOSIS: Inadequate oral intake related to decreased appetite as evidenced by severe muscle and fat depletion, poor po intake.   Goal: Pt will meet >90% of estimated nutritional needs  Monitor:  PO/supplement intake, labs, weight changes, I/O's  Reason for Assessment: MST=2  81 y.o. female  Admitting Dx: <principal problem not specified>  This is a 81 y.o. year old female with significant past medical history of ESRD on HD MWF, COPD, HTN, Afib presenting with diarrhea. Pt reports 3 weeks of progressive diarrhea. No nausea, vomiting, fevers, chills. Pt denies any abd pain. Has been tolerating dialysis fairly well. Went to dialysis yesterday with minimal to mild weakness at the end of session.  ASSESSMENT: Pt admitted with diarrhea and C-diff. Hx obtained by pt and pt grandson at bedside. Both confirm a general decline in health over the past 3 weeks, with significant change in the past week. She is fairly independent at baseline, however, grandson reports that family members are now required to assist her more frequently.  She reports UBW of 120-130#. Noted an 11% wt loss over the past 3 months.  Intake PTA has been extremely minimal. Pt reports she doesn't feel like eating due to decreased appetite and fear developing more diarrhea. Per grandson, "everything runs right through her". Meal tray at bedside has been unattempted and both confirm that she did not eat breakfast this morning.  Pt reports she does not like Nepro or Prostat supplement. She reports she occasionally drinks a supplement at the  dialysis center that is black in color and "tastes like butter pecan". She is agreeable to [employer]. Discussed importance of nutritional adequacy for healing. Pt and grandsonson very grateful for visit.  Labs reviewed. BUN/Creat: 37/9.22.   Nutrition Focused Physical Exam:  Subcutaneous Fat:  Orbital Region: mild depleiton Upper Arm Region: severe depletion Thoracic and Lumbar Region: WDL  Muscle:  Temple Region: mild depletion Clavicle Bone Region: severe depletion Clavicle and Acromion Bone Region: severe depletion Scapular Bone Region: severe depletion Dorsal Hand: mild depletion Patellar Region: severe depletion Anterior Thigh Region: severe depletion Posterior Calf Region: severe depletion  Edema: none present  Height: Ht Readings from Last 1 Encounters:  10/28/14  (1.651 m)    Weight: Wt Readings from Last 1 Encounters:  10/28/14 119 lb 9 oz (54.233 kg)    Ideal Body Weight: 125#  % Ideal Body Weight: 95%  Wt Readings from Last 10 Encounters:  10/28/14 119 lb 9 oz (54.233 kg)  08/05/14 134 lb 14.7 oz (61.2 kg)  10/08/11 183 lb (83.008 kg)  10/11/08 207 lb (93.895 kg)  08/09/08 194 lb (87.998 kg)    Usual Body Weight: 130#  % Usual Body Weight: 92%  BMI:  Body mass index is 19.9 kg/(m^2). Normal weight range  Estimated Nutritional Needs: Kcal: 1600-1800 Protein: 68-78 grams Fluid: >1.5 L  Skin: Intact  Diet Order: Diet renal W/1245mL fluid restriction  EDUCATION NEEDS: -Education needs addressed   Intake/Output Summary (Last 24 hours) at 10/28/14 1218 Last data filed at 10/28/14 0046  Gross per 24 hour  Intake    200 ml  Output  0 ml  Net    200 ml    Last BM: 10/28/14  Labs:   Recent Labs Lab 10/27/14 1939 10/27/14 2116 10/28/14 0637  NA 140 140 139  K 4.0 4.4 3.6  CL 104 104 103  CO2 19 18* 22  BUN 34* 35* 37*  CREATININE 9.13* 9.09* 9.22*  CALCIUM 9.5 9.8 9.0  GLUCOSE 79 74 74    CBG (last 3)   No results for input(s): GLUCAP in the last 72 hours.  Scheduled Meds: . amiodarone  200 mg Oral Daily  . arformoterol  15 mcg Nebulization BID  . aspirin EC  81 mg Oral Daily  . atorvastatin  20 mg Oral Daily  . budesonide  0.5 mg Nebulization BID  . cefTRIAXone (ROCEPHIN)  IV  1 g Intravenous QHS  . clopidogrel  75 mg Oral Daily  . [START ON 10/31/2014] colchicine  0.3 mg Oral Once per day on Mon Thu  . fesoterodine  8 mg Oral Daily  . heparin  5,000 Units Subcutaneous 3 times per day  . metronidazole  500 mg Intravenous Q8H  . midodrine  10 mg Oral BID WC  . multivitamin  1 tablet Oral QHS  . pantoprazole  40 mg Oral Daily  . roflumilast  500 mcg Oral Daily  . theophylline  100 mg Oral BID  . tiotropium  18 mcg Inhalation Daily    Continuous Infusions: . sodium chloride 50 mL/hr at 10/28/14 0210    Past Medical History  Diagnosis Date  . Hypertension   . Hyperlipidemia   . COPD (chronic obstructive pulmonary disease)   . Anemia   . ESRD (end stage renal disease) on dialysis   . CHF (congestive heart failure)   . GERD (gastroesophageal reflux disease)   . Constipation   . PEA (Pulseless electrical activity) 04/19/2012    PEA arrest complicated by hyperkalemia, PNA, anocic encephalopathy, VDRF    Past Surgical History  Procedure Laterality Date  . Abdominal hysterectomy    . Arteriovenous fistula  06/02/12    left radiocephalic - failed  . Arteriovenous graft placement  07/14/12    Created at [facility]    Eppie Barhorst A. Mayford KnifeWilliams, RD, LDN, CDE Pager: [phone] After hours Pager: [phone]

## 2014-10-29 DIAGNOSIS — Z720 Tobacco use: Secondary | ICD-10-CM

## 2014-10-29 DIAGNOSIS — A047 Enterocolitis due to Clostridium difficile: Principal | ICD-10-CM

## 2014-10-29 DIAGNOSIS — Z992 Dependence on renal dialysis: Secondary | ICD-10-CM

## 2014-10-29 DIAGNOSIS — N186 End stage renal disease: Secondary | ICD-10-CM

## 2014-10-29 DIAGNOSIS — R918 Other nonspecific abnormal finding of lung field: Secondary | ICD-10-CM

## 2014-10-29 DIAGNOSIS — E43 Unspecified severe protein-calorie malnutrition: Secondary | ICD-10-CM | POA: Diagnosis present

## 2014-10-29 LAB — COMPREHENSIVE METABOLIC PANEL
ALT: 9 U/L (ref 0–35)
ANION GAP: 13 (ref 5–15)
AST: 15 U/L (ref 0–37)
Albumin: 2.2 g/dL — ABNORMAL LOW (ref 3.5–5.2)
Alkaline Phosphatase: 57 U/L (ref 39–117)
BILIRUBIN TOTAL: 0.8 mg/dL (ref 0.3–1.2)
BUN: 34 mg/dL — ABNORMAL HIGH (ref 6–23)
CHLORIDE: 105 mmol/L (ref 96–112)
CO2: 20 mmol/L (ref 19–32)
Calcium: 8.3 mg/dL — ABNORMAL LOW (ref 8.4–10.5)
Creatinine, Ser: 8.61 mg/dL — ABNORMAL HIGH (ref 0.50–1.10)
GFR calc non Af Amer: 4 mL/min — ABNORMAL LOW (ref >90)
GFR, EST AFRICAN AMERICAN: 4 mL/min — AB (ref >90)
GLUCOSE: 60 mg/dL — AB (ref 70–99)
Potassium: 3.3 mmol/L — ABNORMAL LOW (ref 3.5–5.1)
SODIUM: 138 mmol/L (ref 135–145)
TOTAL PROTEIN: 5.4 g/dL — AB (ref 6.0–8.3)

## 2014-10-29 LAB — CBC WITH DIFFERENTIAL/PLATELET
BASOS ABS: 0 10*3/uL (ref 0.0–0.1)
Basophils Relative: 0 % (ref 0–1)
EOS PCT: 0 % (ref 0–5)
Eosinophils Absolute: 0 10*3/uL (ref 0.0–0.7)
HCT: 29.4 % — ABNORMAL LOW (ref 36.0–46.0)
Hemoglobin: 9.6 g/dL — ABNORMAL LOW (ref 12.0–15.0)
Lymphocytes Relative: 7 % — ABNORMAL LOW (ref 12–46)
Lymphs Abs: 1 10*3/uL (ref 0.7–4.0)
MCH: 30 pg (ref 26.0–34.0)
MCHC: 32.7 g/dL (ref 30.0–36.0)
MCV: 91.9 fL (ref 78.0–100.0)
MONO ABS: 1.3 10*3/uL — AB (ref 0.1–1.0)
MONOS PCT: 9 % (ref 3–12)
NEUTROS PCT: 84 % — AB (ref 43–77)
Neutro Abs: 11.6 10*3/uL — ABNORMAL HIGH (ref 1.7–7.7)
Platelets: 134 10*3/uL — ABNORMAL LOW (ref 150–400)
RBC: 3.2 MIL/uL — AB (ref 3.87–5.11)
RDW: 19.2 % — ABNORMAL HIGH (ref 11.5–15.5)
WBC: 13.8 10*3/uL — ABNORMAL HIGH (ref 4.0–10.5)

## 2014-10-29 MED ORDER — CETYLPYRIDINIUM CHLORIDE 0.05 % MT LIQD
7.0000 mL | Freq: Two times a day (BID) | OROMUCOSAL | Status: DC
  Administered 2014-10-29 – 2014-11-01 (×6): 7 mL via OROMUCOSAL

## 2014-10-29 NOTE — Progress Notes (Signed)
[facility] PROGRESS NOTE   [name] ZOX:[id number] DOB: [birth year] DOA: 10/27/2014 PCP: [name], MD  HPI/Subjective: Diarrhea improving since yesterday, patient did not remember me seeing her yesterday.  Assessment/Plan: Principal Problem:   C. difficile colitis Active Problems:   ESRD on dialysis   Lung mass   Tobacco abuse   Diarrhea   Protein-calorie malnutrition, severe   C. difficile colitis   Presented with diarrhea, slight nausea and vomiting. CT showed colitis involving the descending and sigmoid colon, Stool is positive for C. difficile by PCR. Will be treated as severe C. difficile colitis, with baseline creatinine above 1.5 and WBCs more than 15K on admission. Started on Flagyl in the ED, discontinued. Started on oral vancomycin. Overall feels much better, diarrhea slowing down, continue current treatment.  ESRD Nephrology consulted, patient getting dialysis on Monday, Wednesday and Friday.  Lung mass Right upper lobe mass measures 19 x 13 mm showed an CT scan on 08/02/2014. Patient made aware about the mass, this is needs close follow-up as it appears to be bronchogenic carcinoma.  History of tobacco abuse Counseled extensively.  Severe protein calorie malnutrition Dietitian consulted, to add dietary supplements.  Code Status: Full Code Family Communication: Plan discussed with the patient. Disposition Plan: Remains inpatient Diet: Diet renal W/1237mL fluid restriction  Consultants:  Nephrology  Procedures:  Dialysis on Monday/Wednesday/Friday  Antibiotics:  Oral vancomycin   Objective: Filed Vitals:   10/29/14 0634  BP: 102/41  Pulse: 82  Temp: 97.9 F (36.6 C)  Resp: 20    Intake/Output Summary (Last 24 hours) at 10/29/14 1243 Last data filed at 10/28/14 2215  Gross per 24 hour  Intake  272.5 ml  Output    625 ml  Net -352.5 ml   Filed Weights   10/28/14 1630 10/28/14 2033 10/29/14 0634  Weight: 55.3 kg (121  lb 14.6 oz) 54.8 kg (120 lb 13 oz) 54.5 kg (120 lb 2.4 oz)    Exam: General: Alert and awake, oriented x3, not in any acute distress. HEENT: anicteric sclera, pupils reactive to light and accommodation, EOMI CVS: S1-S2 clear, no murmur rubs or gallops Chest: clear to auscultation bilaterally, no wheezing, rales or rhonchi Abdomen: soft nontender, nondistended, normal bowel sounds, no organomegaly Extremities: no cyanosis, clubbing or edema noted bilaterally Neuro: Cranial nerves II-XII intact, no focal neurological deficits  Data Reviewed: Basic Metabolic Panel:  Recent Labs Lab 10/27/14 0029 10/27/14 1939 10/27/14 2116 10/28/14 0637 10/29/14 0412  NA  --  140 140 139 138  K  --  4.0 4.4 3.6 3.3*  CL  --  104 104 103 105  CO2  --  19 18* 22 20  GLUCOSE  --  79 74 74 60*  BUN  --  34* 35* 37* 34*  CREATININE 9.31* 9.13* 9.09* 9.22* 8.61*  CALCIUM  --  9.5 9.8 9.0 8.3*   Liver Function Tests:  Recent Labs Lab 10/27/14 1939 10/27/14 2116 10/28/14 0637 10/29/14 0412  AST 32 53* 28 15  ALT ALKPHOS 53 58 56 57  BILITOT 0.8 1.0 0.7 0.8  PROT 6.6 7.1 6.4 5.4*  ALBUMIN 2.9* 3.1* 2.7* 2.2*   No results for input(s): LIPASE, AMYLASE in the last 168 hours. No results for input(s): AMMONIA in the last 168 hours. CBC:  Recent Labs Lab 10/27/14 0029 10/27/14 2116 10/28/14 0637 10/29/14 0412  WBC 15.4* 17.7* 15.7* 13.8*  NEUTROABS  --  15.2* 13.5* 11.6*  HGB 10.6* 12.3 11.1* 9.6*  HCT 32.5* 37.0 34.0* 29.4*  MCV 93.1 92.7 92.9 91.9  PLT 159 145* 162 134*   Cardiac Enzymes: No results for input(s): CKTOTAL, CKMB, CKMBINDEX, TROPONINI in the last 168 hours. BNP (last 3 results) No results for input(s): BNP in the last 8760 hours.  ProBNP (last 3 results) No results for input(s): PROBNP in the last 8760 hours.  CBG: No results for input(s): GLUCAP in the last 168 hours.  Micro Recent Results (from the past 240 hour(s))  Clostridium Difficile by PCR      Status: Abnormal   Collection Time: 10/27/14 11:46 PM  Result Value Ref Range Status   C difficile by pcr POSITIVE (A) NEGATIVE Final    Comment: CRITICAL RESULT CALLED TO, READ BACK BY AND VERIFIED WITH: J. BOARD RN 10:55 10/28/14 ([email or web address])      Studies: Ct Abdomen Pelvis Wo Contrast  10/28/2014   CLINICAL DATA:  Diarrhea for 3 weeks.  Rule out colitis.  EXAM: CT ABDOMEN AND PELVIS WITHOUT CONTRAST  TECHNIQUE: Multidetector CT imaging of the abdomen and pelvis was performed following the standard protocol without IV contrast.  COMPARISON:  CT 10/02/2014  FINDINGS: Chronic change at the lung bases. The heart is enlarged. There is a significant tortuosity of the distal thoracic aorta that appears similar to prior exam. No pleural effusion.  There is colonic wall thickening extending from the mid descending colon through the rectosigmoid colon. Associated pericolonic inflammatory change. Findings are consistent with colitis. No pneumatosis or perforation. Multiple descending and sigmoid colonic diverticula are again seen without peri-diverticular inflammatory change to suggest diverticulitis. There is no small bowel dilatation. The stomach is physiologically distended.  Layering gallstones within physiologically distended gallbladder. The unenhanced liver is unremarkable. The unenhanced spleen, pancreas, and adrenal glands are normal. There is renal atrophy without hydronephrosis. The previous right renal cyst is not well seen without contrast.  Large infrarenal abdominal aortic aneurysm appear similar to prior exam. This measures 7.2 x 6.2 cm in biaxial dimension, however differences in size are likely related to differences in technique. There is no periaortic soft tissue stranding to suggest rupture. Dense atherosclerosis throughout the abdominal aorta and its branches again seen. No free air, free fluid, or intra-abdominal fluid collection.  Within the pelvis the urinary bladder is decompressed not  well evaluated. The uterus not well seen, atrophic versus surgically absent. There is no adnexal mass.  Anterolisthesis of L4 on L5 and L5 on S1 with associated degenerative change, stable. Stable degenerative change of the pubic symphysis. No acute osseous abnormalities.  IMPRESSION: 1. Colitis involving the descending and sigmoid colon. Findings suggest an infectious or inflammatory etiology. There is diverticulosis throughout the colon without peri-diverticular inflammatory change to suggest diverticulitis. 2. Stable chronic findings include significant infrarenal abdominal aortic aneurysm. Current maximal dimension of 7.2 cm is minimally increased compared to prior where it measured 6.9 cm, however this is likely related to differences in technique. 3. Cholelithiasis.   Electronically Signed   By: Rubye OaksMelanie  Ehinger M.D.   On: 10/28/2014 05:59    Scheduled Meds: . amiodarone  200 mg Oral Daily  . arformoterol  15 mcg Nebulization BID  . aspirin EC  81 mg Oral Daily  . atorvastatin  20 mg Oral Daily  . budesonide  0.5 mg Nebulization BID  . calcitRIOL  0.25 mcg Oral Q M,W,F-HD  . clopidogrel  75 mg Oral Daily  . [START ON 10/31/2014] colchicine  0.3 mg Oral Once per day on Mon Thu  .  feeding supplement (RESOURCE BREEZE)  1 Container Oral BID BM  . fesoterodine  8 mg Oral Daily  . heparin  5,000 Units Subcutaneous 3 times per day  . midodrine  10 mg Oral BID WC  . multivitamin  1 tablet Oral QHS  . pantoprazole  40 mg Oral Daily  . roflumilast  500 mcg Oral Daily  . sevelamer carbonate  1,600 mg Oral TID WC  . theophylline  100 mg Oral BID  . tiotropium  18 mcg Inhalation Daily  . vancomycin  125 mg Oral QID   Continuous Infusions: . sodium chloride 50 mL/hr at 10/28/14 1301       Time spent: 35 minutes    [facility] A  [facility] Pager [phone] If 7PM-7AM, please contact night-coverage at [email or web address], password [facility] 10/29/2014, 12:43 PM  LOS: 2 days

## 2014-10-29 NOTE — Progress Notes (Signed)
Subjective:   Feels better, only 1 loose stool this AM. Eating breakfast  Objective Filed Vitals:   10/28/14 2033 10/28/14 2150 10/29/14 0634 10/29/14 0744  BP: 109/59 108/62 102/41   Pulse: 101 106 82   Temp: 98.2 F (36.8 C) 98.1 F (36.7 C) 97.9 F (36.6 C)   TempSrc: Oral Oral Oral   Resp: Height:      Weight: 54.8 kg (120 lb 13 oz)  54.5 kg (120 lb 2.4 oz)   SpO2: 98% 97% 99% 97%   Physical Exam General: alert and oriented. No acute distress.  Heart: RRR  Lungs: CTA, unlabored  Abdomen: soft, mild tenderness +BS Extremities: LLE +1  Dialysis Access:  R IJ cath   Dialysis Orders: MWF [facility] 3hr 30 mins 57.5 kg 3K/2.25 ca 400/1.5 R IJ Cath 3000 u hep micera 75 q 2 weeks (next dose due 2/10) calcitirol 0.25  Assessment/Plan: 1. cdiff- colitis on CT. management per primary. Oral vanc , symptoms improving this AM 2. afib- on amiodarone/ no coumadin 3. ESRD - MWF @ [facility], HD yesterday  4. Hypertension/volume - 102/41 under edw/ will need lower at DC- midodrine BID  5. Anemia - hgb 9.6 micera q 2 weeks, last dose 1/27- next dose 2/10. Watch CBC No Fe- last tsat 53 6. Metabolic bone disease - Ca+8.3, cont calcitirol. Last phos 6.8 adn PTH 33(calcitirol was decreased from 1 at this time) cont renvela 7. Nutrition - alb 2.2. Reports poor appetite. renal diet. renal vit  Jetty Duhamel, NP [facility] Beeper [phone] 10/29/2014,10:21 AM  LOS: 2 days    Renal Attending: Stable as noteda and articulated above.  Mild abd tenderness on exam.  Some prob with catheter BR yesterday and they would like repair if poss PTD. [place] C    Additional Objective Labs: Basic Metabolic Panel:  Recent Labs Lab 10/27/14 2116 10/28/14 0637 10/29/14 0412  NA 140 139 138  K 4.4 3.6 3.3*  CL 104 103 105  CO2 18* 22 20  GLUCOSE 74 74 60*  BUN 35* 37* 34*  CREATININE 9.09* 9.22* 8.61*  CALCIUM 9.8 9.0 8.3*   Liver Function  Tests:  Recent Labs Lab 10/27/14 2116 10/28/14 0637 10/29/14 0412  AST 53* 28 15  ALT ALKPHOS 58 56 57  BILITOT 1.0 0.7 0.8  PROT 7.1 6.4 5.4*  ALBUMIN 3.1* 2.7* 2.2*   No results for input(s): LIPASE, AMYLASE in the last 168 hours. CBC:  Recent Labs Lab 10/27/14 0029 10/27/14 2116 10/28/14 0637 10/29/14 0412  WBC 15.4* 17.7* 15.7* 13.8*  NEUTROABS  --  15.2* 13.5* 11.6*  HGB 10.6* 12.3 11.1* 9.6*  HCT 32.5* 37.0 34.0* 29.4*  MCV 93.1 92.7 92.9 91.9  PLT 159 145* 162 134*   Blood Culture    Component Value Date/Time   SDES TRACHEAL ASPIRATE 07/28/2014 1025   SPECREQUEST NONE 07/28/2014 1025   CULT  07/28/2014 1025    Non-Pathogenic Oropharyngeal-type Flora Isolated. Performed at [facility]    REPTSTATUS 07/30/2014 FINAL 07/28/2014 1025    Cardiac Enzymes: No results for input(s): CKTOTAL, CKMB, CKMBINDEX, TROPONINI in the last 168 hours. CBG: No results for input(s): GLUCAP in the last 168 hours. Iron Studies: No results for input(s): IRON, TIBC, TRANSFERRIN, FERRITIN in the last 72 hours. @ Studies/Results: Ct Abdomen Pelvis Wo Contrast  10/28/2014   CLINICAL DATA:  Diarrhea for 3 weeks.  Rule out colitis.  EXAM: CT ABDOMEN AND  PELVIS WITHOUT CONTRAST  TECHNIQUE: Multidetector CT imaging of the abdomen and pelvis was performed following the standard protocol without IV contrast.  COMPARISON:  CT 10/02/2014  FINDINGS: Chronic change at the lung bases. The heart is enlarged. There is a significant tortuosity of the distal thoracic aorta that appears similar to prior exam. No pleural effusion.  There is colonic wall thickening extending from the mid descending colon through the rectosigmoid colon. Associated pericolonic inflammatory change. Findings are consistent with colitis. No pneumatosis or perforation. Multiple descending and sigmoid colonic diverticula are again seen without peri-diverticular inflammatory change to suggest  diverticulitis. There is no small bowel dilatation. The stomach is physiologically distended.  Layering gallstones within physiologically distended gallbladder. The unenhanced liver is unremarkable. The unenhanced spleen, pancreas, and adrenal glands are normal. There is renal atrophy without hydronephrosis. The previous right renal cyst is not well seen without contrast.  Large infrarenal abdominal aortic aneurysm appear similar to prior exam. This measures 7.2 x 6.2 cm in biaxial dimension, however differences in size are likely related to differences in technique. There is no periaortic soft tissue stranding to suggest rupture. Dense atherosclerosis throughout the abdominal aorta and its branches again seen. No free air, free fluid, or intra-abdominal fluid collection.  Within the pelvis the urinary bladder is decompressed not well evaluated. The uterus not well seen, atrophic versus surgically absent. There is no adnexal mass.  Anterolisthesis of L4 on L5 and L5 on S1 with associated degenerative change, stable. Stable degenerative change of the pubic symphysis. No acute osseous abnormalities.  IMPRESSION: 1. Colitis involving the descending and sigmoid colon. Findings suggest an infectious or inflammatory etiology. There is diverticulosis throughout the colon without peri-diverticular inflammatory change to suggest diverticulitis. 2. Stable chronic findings include significant infrarenal abdominal aortic aneurysm. Current maximal dimension of 7.2 cm is minimally increased compared to prior where it measured 6.9 cm, however this is likely related to differences in technique. 3. Cholelithiasis.   Electronically Signed   By: Rubye OaksMelanie  Ehinger M.D.   On: 10/28/2014 05:59   Medications: . sodium chloride 50 mL/hr at 10/28/14 1301   . amiodarone  200 mg Oral Daily  . arformoterol  15 mcg Nebulization BID  . aspirin EC  81 mg Oral Daily  . atorvastatin  20 mg Oral Daily  . budesonide  0.5 mg Nebulization BID   . calcitRIOL  0.25 mcg Oral Q M,W,F-HD  . clopidogrel  75 mg Oral Daily  . [START ON 10/31/2014] colchicine  0.3 mg Oral Once per day on Mon Thu  . feeding supplement (RESOURCE BREEZE)  1 Container Oral BID BM  . fesoterodine  8 mg Oral Daily  . heparin  5,000 Units Subcutaneous 3 times per day  . midodrine  10 mg Oral BID WC  . multivitamin  1 tablet Oral QHS  . pantoprazole  40 mg Oral Daily  . roflumilast  500 mcg Oral Daily  . sevelamer carbonate  1,600 mg Oral TID WC  . theophylline  100 mg Oral BID  . tiotropium  18 mcg Inhalation Daily  . vancomycin  125 mg Oral QID

## 2014-10-30 LAB — COMPREHENSIVE METABOLIC PANEL
ALK PHOS: 39 U/L (ref 39–117)
ALT: 9 U/L (ref 0–35)
ANION GAP: 14 (ref 5–15)
AST: 22 U/L (ref 0–37)
Albumin: 2.2 g/dL — ABNORMAL LOW (ref 3.5–5.2)
BILIRUBIN TOTAL: 0.4 mg/dL (ref 0.3–1.2)
BUN: 42 mg/dL — ABNORMAL HIGH (ref 6–23)
CO2: 18 mmol/L — ABNORMAL LOW (ref 19–32)
Calcium: 8 mg/dL — ABNORMAL LOW (ref 8.4–10.5)
Chloride: 107 mmol/L (ref 96–112)
Creatinine, Ser: 9.62 mg/dL — ABNORMAL HIGH (ref 0.50–1.10)
GFR calc non Af Amer: 3 mL/min — ABNORMAL LOW (ref >90)
GFR, EST AFRICAN AMERICAN: 4 mL/min — AB (ref >90)
Glucose, Bld: 85 mg/dL (ref 70–99)
Potassium: 3.1 mmol/L — ABNORMAL LOW (ref 3.5–5.1)
Sodium: 139 mmol/L (ref 135–145)
TOTAL PROTEIN: 5.4 g/dL — AB (ref 6.0–8.3)

## 2014-10-30 LAB — CBC WITH DIFFERENTIAL/PLATELET
BASOS PCT: 0 % (ref 0–1)
Basophils Absolute: 0 10*3/uL (ref 0.0–0.1)
EOS PCT: 1 % (ref 0–5)
Eosinophils Absolute: 0.1 10*3/uL (ref 0.0–0.7)
HCT: 26.7 % — ABNORMAL LOW (ref 36.0–46.0)
HEMOGLOBIN: 8.7 g/dL — AB (ref 12.0–15.0)
LYMPHS PCT: 9 % — AB (ref 12–46)
Lymphs Abs: 0.9 10*3/uL (ref 0.7–4.0)
MCH: 29.7 pg (ref 26.0–34.0)
MCHC: 32.6 g/dL (ref 30.0–36.0)
MCV: 91.1 fL (ref 78.0–100.0)
Monocytes Absolute: 1.1 10*3/uL — ABNORMAL HIGH (ref 0.1–1.0)
Monocytes Relative: 12 % (ref 3–12)
Neutro Abs: 7.1 10*3/uL (ref 1.7–7.7)
Neutrophils Relative %: 78 % — ABNORMAL HIGH (ref 43–77)
Platelets: 135 10*3/uL — ABNORMAL LOW (ref 150–400)
RBC: 2.93 MIL/uL — ABNORMAL LOW (ref 3.87–5.11)
RDW: 18.8 % — AB (ref 11.5–15.5)
WBC: 9.1 10*3/uL (ref 4.0–10.5)

## 2014-10-30 MED ORDER — DARBEPOETIN ALFA 60 MCG/0.3ML IJ SOSY
60.0000 ug | PREFILLED_SYRINGE | INTRAMUSCULAR | Status: DC
  Administered 2014-10-31: 60 ug via INTRAVENOUS
  Filled 2014-10-30: qty 0.3

## 2014-10-30 MED ORDER — ACETAMINOPHEN 325 MG PO TABS
650.0000 mg | ORAL_TABLET | Freq: Four times a day (QID) | ORAL | Status: DC | PRN
  Filled 2014-10-30: qty 2

## 2014-10-30 MED ORDER — POTASSIUM CHLORIDE CRYS ER 20 MEQ PO TBCR
40.0000 meq | EXTENDED_RELEASE_TABLET | Freq: Once | ORAL | Status: AC
  Administered 2014-10-30: 40 meq via ORAL
  Filled 2014-10-30: qty 2

## 2014-10-30 NOTE — Progress Notes (Signed)
[facility] PROGRESS NOTE   [name] ZOX:[id number] DOB: [birth year] DOA: 10/27/2014 PCP: [name], MD  HPI/Subjective: Had cramping abdominal pain last night, resolved now. Nausea, vomiting improved as well as diarrhea.  Assessment/Plan: Principal Problem:   C. difficile colitis Active Problems:   ESRD on dialysis   Lung mass   Tobacco abuse   Diarrhea   Protein-calorie malnutrition, severe   C. difficile colitis   Presented with diarrhea, slight nausea and vomiting. CT showed colitis involving the descending and sigmoid colon, Stool is positive for C. difficile by PCR. Will be treated as severe C. difficile colitis, with baseline creatinine above 1.5 and WBCs more than 15K on admission. Started on Flagyl in the ED, discontinued. Continue current treatment with oral vancomycin.  ESRD Nephrology consulted, patient getting dialysis on Monday, Wednesday and Friday.  Lung mass Right upper lobe mass measures 19 x 13 mm showed an CT scan on 08/02/2014. Patient made aware about the mass, this is needs close follow-up as it appears to be bronchogenic carcinoma.  History of tobacco abuse Counseled extensively.  Severe protein calorie malnutrition Dietitian consulted, to add dietary supplements.  Code Status: Full Code Family Communication: Plan discussed with the patient. Disposition Plan: Remains inpatient Diet: Diet renal W/124mL fluid restriction  Consultants:  Nephrology  Procedures:  Dialysis on Monday/Wednesday/Friday  Antibiotics:  Oral vancomycin   Objective: Filed Vitals:   10/30/14 0606  BP: 115/51  Pulse: 103  Temp: 98.9 F (37.2 C)  Resp: 18    Intake/Output Summary (Last 24 hours) at 10/30/14 1141 Last data filed at 10/29/14 1900  Gross per 24 hour  Intake    600 ml  Output      0 ml  Net    600 ml   Filed Weights   10/28/14 1630 10/28/14 2033 10/29/14 0634  Weight: 55.3 kg (121 lb 14.6 oz) 54.8 kg (120 lb 13 oz) 54.5  kg (120 lb 2.4 oz)    Exam: General: Alert and awake, oriented x3, not in any acute distress. HEENT: anicteric sclera, pupils reactive to light and accommodation, EOMI CVS: S1-S2 clear, no murmur rubs or gallops Chest: clear to auscultation bilaterally, no wheezing, rales or rhonchi Abdomen: soft nontender, nondistended, normal bowel sounds, no organomegaly Extremities: no cyanosis, clubbing or edema noted bilaterally Neuro: Cranial nerves II-XII intact, no focal neurological deficits  Data Reviewed: Basic Metabolic Panel:  Recent Labs Lab 10/27/14 1939 10/27/14 2116 10/28/14 0637 10/29/14 0412 10/30/14 0500  NA 140 140 139 138 139  K 4.0 4.4 3.6 3.3* 3.1*  CL 104 104 103 105 107  CO2 19 18* 22 20 18*  GLUCOSE 79 74 74 60* 85  BUN 34* 35* 37* 34* 42*  CREATININE 9.13* 9.09* 9.22* 8.61* 9.62*  CALCIUM 9.5 9.8 9.0 8.3* 8.0*   Liver Function Tests:  Recent Labs Lab 10/27/14 1939 10/27/14 2116 10/28/14 0637 10/29/14 0412 10/30/14 0500  AST 32 53* ALT ALKPHOS 53 58 56 57 39  BILITOT 0.8 1.0 0.7 0.8 0.4  PROT 6.6 7.1 6.4 5.4* 5.4*  ALBUMIN 2.9* 3.1* 2.7* 2.2* 2.2*   No results for input(s): LIPASE, AMYLASE in the last 168 hours. No results for input(s): AMMONIA in the last 168 hours. CBC:  Recent Labs Lab 10/27/14 0029 10/27/14 2116 10/28/14 0637 10/29/14 0412 10/30/14 0500  WBC 15.4* 17.7* 15.7* 13.8* 9.1  NEUTROABS  --  15.2* 13.5* 11.6* 7.1  HGB 10.6* 12.3 11.1* 9.6*  8.7*  HCT 32.5* 37.0 34.0* 29.4* 26.7*  MCV 93.1 92.7 92.9 91.9 91.1  PLT 159 145* 162 134* 135*   Cardiac Enzymes: No results for input(s): CKTOTAL, CKMB, CKMBINDEX, TROPONINI in the last 168 hours. BNP (last 3 results) No results for input(s): BNP in the last 8760 hours.  ProBNP (last 3 results) No results for input(s): PROBNP in the last 8760 hours.  CBG: No results for input(s): GLUCAP in the last 168 hours.  Micro Recent Results (from the past 240  hour(s))  Stool culture     Status: None (Preliminary result)   Collection Time: 10/27/14 11:20 PM  Result Value Ref Range Status   Specimen Description STOOL  Final   Special Requests A  Final   Culture   Final    Culture reincubated for better growth Performed at [facility]    Report Status PENDING  Incomplete  Clostridium Difficile by PCR     Status: Abnormal   Collection Time: 10/27/14 11:46 PM  Result Value Ref Range Status   C difficile by pcr POSITIVE (A) NEGATIVE Final    Comment: CRITICAL RESULT CALLED TO, READ BACK BY AND VERIFIED WITH: J. BOARD RN 10:55 10/28/14 ([email or web address])      Studies: No results found.  Scheduled Meds: . amiodarone  200 mg Oral Daily  . antiseptic oral rinse  7 mL Mouth Rinse BID  . arformoterol  15 mcg Nebulization BID  . aspirin EC  81 mg Oral Daily  . atorvastatin  20 mg Oral Daily  . budesonide  0.5 mg Nebulization BID  . calcitRIOL  0.25 mcg Oral Q M,W,F-HD  . clopidogrel  75 mg Oral Daily  . [START ON 10/31/2014] colchicine  0.3 mg Oral Once per day on Mon Thu  . [START ON 10/31/2014] darbepoetin (ARANESP) injection - DIALYSIS  60 mcg Intravenous Q Mon-HD  . feeding supplement (RESOURCE BREEZE)  1 Container Oral BID BM  . fesoterodine  8 mg Oral Daily  . heparin  5,000 Units Subcutaneous 3 times per day  . midodrine  10 mg Oral BID WC  . multivitamin  1 tablet Oral QHS  . pantoprazole  40 mg Oral Daily  . roflumilast  500 mcg Oral Daily  . sevelamer carbonate  1,600 mg Oral TID WC  . theophylline  100 mg Oral BID  . tiotropium  18 mcg Inhalation Daily  . vancomycin  125 mg Oral QID   Continuous Infusions: . sodium chloride 50 mL/hr at 10/28/14 1301       Time spent: 35 minutes    [facility]MA[facility],Lyda Colcord A  [facility] Pager [phone] If 7PM-7AM, please contact night-coverage at [email or web address], password [email or web address] 10/30/2014, 11:41 AM  LOS: 3 days

## 2014-10-30 NOTE — Progress Notes (Signed)
During the night patient in the fetal position due to severe abd cramping. Patient up several times during the night [facility] Was able to obtained small simple of stool to send to lab. Called on-call MD to request something for pain. New order for Tylenol obtained.

## 2014-10-30 NOTE — Progress Notes (Addendum)
Subjective:   Had some abd pain last night that resolved with tylenol. Feels well this AM  Objective Filed Vitals:   10/29/14 1606 10/29/14 1951 10/29/14 2104 10/30/14 0606  BP: 120/53  115/45 115/51  Pulse:   84 103  Temp: 98.7 F (37.1 C)  99.4 F (37.4 C) 98.9 F (37.2 C)  TempSrc: Oral  Oral Oral  Resp: Height:      Weight:      SpO2: 100% 96% 99% 100%   Physical Exam General: alert and oriented. No acute distress.  Heart: RRR  Lungs: CTA, unlabored  Abdomen: soft, nontender +BS  Extremities:trace LLE edema.  Dialysis Access: R IJ cath- will arrange for IR to replace if remains here  Dialysis Orders: MWF [facility] 3hr 30 mins 57.5 kg 3K/2.25 ca 400/1.5 R IJ Cath 3000 u hep micera 75 q 2 weeks (next dose due 2/10) calcitirol 0.25   Assessment/Plan: 1. cdiff- colitis on CT. management per primary. Oral vanc , symptoms improving this AM 2. afib- on amiodarone/ no coumadin 3. ESRD - MWF @ [facility], HD tomorrow,on 3K bath 4. Hypertension/volume - 115/51 under edw/ will need lower at DC- midodrine BID  5. Anemia - hgb 8.7 micera q 2 weeks, last dose 1/27- next dose 2/100 start Aranesp tomorrow. Watch CBC No Fe- last tsat 53. Check stool cards 6. Metabolic bone disease - Ca+8.3, cont calcitirol. Last phos 6.8 adn PTH 33(calcitirol was decreased from 1 at this time) cont renvela 7. Nutrition - alb 2.2. Reports poor appetite. renal diet. renal vit 8. RUL lung mass, noted on Nov CT scan per primary  Jetty Duhamel, NP [facility] [phone] 10/30/2014,10:52 AM  LOS: 3 days  Renal Attending: She still has some tenderness on palpation of LLQ but not much diarrhea.  Overall looks improved.  Agree with note & plan articulated above.  RUL lung mass will need evaluation Dovber Ernest C   Additional Objective Labs: Basic Metabolic Panel:  Recent Labs Lab 10/28/14 0637 10/29/14 0412 10/30/14 0500  NA 139 138 139  K 3.6 3.3* 3.1*   CL 103 105 107  CO2 22 20 18*  GLUCOSE 74 60* 85  BUN 37* 34* 42*  CREATININE 9.22* 8.61* 9.62*  CALCIUM 9.0 8.3* 8.0*   Liver Function Tests:  Recent Labs Lab 10/28/14 0637 10/29/14 0412 10/30/14 0500  AST ALT ALKPHOS 56 57 39  BILITOT 0.7 0.8 0.4  PROT 6.4 5.4* 5.4*  ALBUMIN 2.7* 2.2* 2.2*   No results for input(s): LIPASE, AMYLASE in the last 168 hours. CBC:  Recent Labs Lab 10/27/14 0029  10/27/14 2116 10/28/14 0637 10/29/14 0412 10/30/14 0500  WBC 15.4*  --  17.7* 15.7* 13.8* 9.1  NEUTROABS  --   < > 15.2* 13.5* 11.6* 7.1  HGB 10.6*  --  12.3 11.1* 9.6* 8.7*  HCT 32.5*  --  37.0 34.0* 29.4* 26.7*  MCV 93.1  --  92.7 92.9 91.9 91.1  PLT 159  --  145* 162 134* 135*  < > = values in this interval not displayed. Blood Culture    Component Value Date/Time   SDES STOOL 10/27/2014 2320   SPECREQUEST A 10/27/2014 2320   CULT  10/27/2014 2320    Culture reincubated for better growth Performed at [facility]    REPTSTATUS PENDING 10/27/2014 2320    Cardiac Enzymes: No results for input(s): CKTOTAL, CKMB, CKMBINDEX, TROPONINI in the  last 168 hours. CBG: No results for input(s): GLUCAP in the last 168 hours. Iron Studies: No results for input(s): IRON, TIBC, TRANSFERRIN, FERRITIN in the last 72 hours. @lablastinr3 @ Studies/Results: No results found. Medications: . sodium chloride 50 mL/hr at 10/28/14 1301   . amiodarone  200 mg Oral Daily  . antiseptic oral rinse  7 mL Mouth Rinse BID  . arformoterol  15 mcg Nebulization BID  . aspirin EC  81 mg Oral Daily  . atorvastatin  20 mg Oral Daily  . budesonide  0.5 mg Nebulization BID  . calcitRIOL  0.25 mcg Oral Q M,W,F-HD  . clopidogrel  75 mg Oral Daily  . [START ON 10/31/2014] colchicine  0.3 mg Oral Once per day on Mon Thu  . feeding supplement (RESOURCE BREEZE)  1 Container Oral BID BM  . fesoterodine  8 mg Oral Daily  . heparin  5,000 Units Subcutaneous 3 times per day  .  midodrine  10 mg Oral BID WC  . multivitamin  1 tablet Oral QHS  . pantoprazole  40 mg Oral Daily  . roflumilast  500 mcg Oral Daily  . sevelamer carbonate  1,600 mg Oral TID WC  . theophylline  100 mg Oral BID  . tiotropium  18 mcg Inhalation Daily  . vancomycin  125 mg Oral QID

## 2014-10-31 ENCOUNTER — Inpatient Hospital Stay (HOSPITAL_COMMUNITY): Payer: Medicare Other

## 2014-10-31 DIAGNOSIS — N186 End stage renal disease: Secondary | ICD-10-CM | POA: Diagnosis present

## 2014-10-31 DIAGNOSIS — Z992 Dependence on renal dialysis: Secondary | ICD-10-CM

## 2014-10-31 LAB — COMPREHENSIVE METABOLIC PANEL
ALBUMIN: 2.3 g/dL — AB (ref 3.5–5.2)
ALK PHOS: 41 U/L (ref 39–117)
ALT: 11 U/L (ref 0–35)
AST: 20 U/L (ref 0–37)
Anion gap: 13 (ref 5–15)
BILIRUBIN TOTAL: 0.4 mg/dL (ref 0.3–1.2)
BUN: 44 mg/dL — AB (ref 6–23)
CO2: 18 mmol/L — ABNORMAL LOW (ref 19–32)
CREATININE: 10.96 mg/dL — AB (ref 0.50–1.10)
Calcium: 8.5 mg/dL (ref 8.4–10.5)
Chloride: 109 mmol/L (ref 96–112)
GFR calc non Af Amer: 3 mL/min — ABNORMAL LOW (ref >90)
GFR, EST AFRICAN AMERICAN: 3 mL/min — AB (ref >90)
GLUCOSE: 81 mg/dL (ref 70–99)
Potassium: 3.8 mmol/L (ref 3.5–5.1)
SODIUM: 140 mmol/L (ref 135–145)
TOTAL PROTEIN: 6 g/dL (ref 6.0–8.3)

## 2014-10-31 LAB — CBC WITH DIFFERENTIAL/PLATELET
Basophils Absolute: 0 10*3/uL (ref 0.0–0.1)
Basophils Relative: 0 % (ref 0–1)
Eosinophils Absolute: 0 10*3/uL (ref 0.0–0.7)
Eosinophils Relative: 0 % (ref 0–5)
HCT: 29.2 % — ABNORMAL LOW (ref 36.0–46.0)
Hemoglobin: 9.5 g/dL — ABNORMAL LOW (ref 12.0–15.0)
Lymphocytes Relative: 12 % (ref 12–46)
Lymphs Abs: 0.8 10*3/uL (ref 0.7–4.0)
MCH: 30.4 pg (ref 26.0–34.0)
MCHC: 32.5 g/dL (ref 30.0–36.0)
MCV: 93.6 fL (ref 78.0–100.0)
MONO ABS: 0.7 10*3/uL (ref 0.1–1.0)
Monocytes Relative: 10 % (ref 3–12)
Neutro Abs: 5.1 10*3/uL (ref 1.7–7.7)
Neutrophils Relative %: 78 % — ABNORMAL HIGH (ref 43–77)
Platelets: 155 10*3/uL (ref 150–400)
RBC: 3.12 MIL/uL — ABNORMAL LOW (ref 3.87–5.11)
RDW: 18.9 % — ABNORMAL HIGH (ref 11.5–15.5)
WBC: 6.6 10*3/uL (ref 4.0–10.5)

## 2014-10-31 LAB — STOOL CULTURE

## 2014-10-31 MED ORDER — FAMOTIDINE 20 MG PO TABS
20.0000 mg | ORAL_TABLET | Freq: Every day | ORAL | Status: DC
  Filled 2014-10-31 (×3): qty 1

## 2014-10-31 MED ORDER — CHLORHEXIDINE GLUCONATE 4 % EX LIQD
CUTANEOUS | Status: AC
  Filled 2014-10-31: qty 15

## 2014-10-31 MED ORDER — PENTAFLUOROPROP-TETRAFLUOROETH EX AERO: 1.0000 "application " | INHALATION_SPRAY | CUTANEOUS | Status: DC | PRN

## 2014-10-31 MED ORDER — HEPARIN SODIUM (PORCINE) 1000 UNIT/ML IJ SOLN
INTRAMUSCULAR | Status: AC
  Filled 2014-10-31: qty 1

## 2014-10-31 MED ORDER — NEPRO/CARBSTEADY PO LIQD: 237.0000 mL | ORAL | Status: DC | PRN

## 2014-10-31 MED ORDER — HEPARIN SODIUM (PORCINE) 1000 UNIT/ML DIALYSIS: 20.0000 [IU]/kg | Freq: Once | INTRAMUSCULAR | Status: DC

## 2014-10-31 MED ORDER — SODIUM CHLORIDE 0.9 % IV SOLN: 100.0000 mL | INTRAVENOUS | Status: DC | PRN

## 2014-10-31 MED ORDER — DARBEPOETIN ALFA 60 MCG/0.3ML IJ SOSY
PREFILLED_SYRINGE | INTRAMUSCULAR | Status: AC
  Filled 2014-10-31: qty 0.3

## 2014-10-31 MED ORDER — ALTEPLASE 2 MG IJ SOLR: 2.0000 mg | Freq: Once | INTRAMUSCULAR | Status: DC | PRN

## 2014-10-31 MED ORDER — LIDOCAINE HCL 1 % IJ SOLN
INTRAMUSCULAR | Status: AC
  Filled 2014-10-31: qty 20

## 2014-10-31 MED ORDER — LIDOCAINE-PRILOCAINE 2.5-2.5 % EX CREA: 1.0000 "application " | TOPICAL_CREAM | CUTANEOUS | Status: DC | PRN

## 2014-10-31 MED ORDER — HEPARIN SODIUM (PORCINE) 1000 UNIT/ML DIALYSIS: 1000.0000 [IU] | INTRAMUSCULAR | Status: DC | PRN

## 2014-10-31 MED ORDER — LIDOCAINE HCL (PF) 1 % IJ SOLN: 5.0000 mL | INTRAMUSCULAR | Status: DC | PRN

## 2014-10-31 MED ORDER — HEPARIN SODIUM (PORCINE) 5000 UNIT/ML IJ SOLN
5000.0000 [IU] | Freq: Three times a day (TID) | INTRAMUSCULAR | Status: DC
  Administered 2014-11-01 (×2): 5000 [IU] via SUBCUTANEOUS
  Filled 2014-10-31 (×3): qty 1

## 2014-10-31 NOTE — Clinical Social Work Note (Addendum)
Patient going home with daughter at discharge with HHPT. Plan confirmed with patient and daughter at bedside. CSW signing off at this time.    UPDATE: CSW was notified by MD that family is now requesting SNF placement at discharge. CSW has started SNF search process. Full assessment to come.     Roddie McBryant Isai Gottlieb MSW, [place], [place], [phone]

## 2014-10-31 NOTE — Clinical Social Work Psychosocial (Signed)
Clinical Social Work Department BRIEF PSYCHOSOCIAL ASSESSMENT 10/31/2014  Patient:  [name] [name]     Account Number:  [id number]     Admit date:  10/27/2014  Clinical Social Worker:  [name]  Date/Time:  10/31/2014 05:00 PM  Referred by:  Physician  Date Referred:  10/31/2014 Referred for  SNF Placement   Other Referral:   NA   Interview type:  Patient Other interview type:   Patient and family interviewed at bedside.    PSYCHOSOCIAL DATA Living Status:  FAMILY Admitted from facility:   Level of care:   Primary support name:  [name] Primary support relationship to patient:  CHILD, ADULT Degree of support available:   Support is strong.    CURRENT CONCERNS Current Concerns  Post-Acute Placement   Other Concerns:   NA    SOCIAL WORK ASSESSMENT / PLAN CSW met with patient and daughter at bedside. Patient appears weak and lying in bed. Initially patient and daughter decided to go home with [facility] services, but now they request SNF placement. Patient appears to have little insight into why she was admitted to the [name] but the patient's daughter understands the reason for the admission. Daughter [name] Patient states that she feels the patient is too weak to return home with her at this time and would like short term rehab placement for patient. Patient also agrees with this.    Patient and daughter report that the patient has been to [place] in the past and would like this facility or [name] or rehab. When asked about how the family has been coping with being the caregivers of the patient, the daughter states that they manage well and feel optimistic that she will be able to return home after rehab. CSW explained SNF search/placement process and answered questions. CSW will follow up with available offers.   Assessment/plan status:  Psychosocial Support/Ongoing Assessment of Needs Other assessment/ plan:   Complete Fl2, Fax, PASRR    Information/referral to community resources:   CSW contact information and SNF list given.    PATIENT'S/FAMILY'S RESPONSE TO PLAN OF CARE: Patient and patient's daughter plan for a discharge to SNF placement. Both seem optimistic that patient will be able to return home after a short rehab stay. CSW will assist with DC.         [name] MSW, [place], [place], [phone]

## 2014-10-31 NOTE — Evaluation (Signed)
Physical Therapy Evaluation Patient Details Name: [name] MRN: [id number] DOB: [birth year] Today's Date: 10/31/2014   History of Present Illness  81 y/ o BF with 3 week history of diarrhea with significant past medical history of ESRD on HD MWF, COPD, HTN, Afib.  Clinical Impression  Pt admitted with above diagnosis. Pt currently with functional limitations due to the deficits listed below (see PT Problem List).  Pt will benefit from skilled PT to increase their independence and safety with mobility to allow discharge to the venue listed below.  Pt's family is very supportive and they understand they need to A her with ambulation until she gets stronger. Recommend HHPT and 3-1 BSC.  Pt would also benefit from OT consult due to L arm weakness/limited ROM.     Follow Up Recommendations Home health PT (was receiving HHPT prior to acute admission)    Equipment Recommendations  3in1 (PT)    Recommendations for Other Services OT consult (L arm weakness/pain)     Precautions / Restrictions Precautions Precautions: Fall      Mobility  Bed Mobility Overal bed mobility: Needs Assistance Bed Mobility: Supine to Sit     Supine to sit: Supervision     General bed mobility comments: slowly with HOB flat, but able with S and cues  Transfers Overall transfer level: Needs assistance Equipment used: Rolling walker (2 wheeled) Transfers: Sit to/from Stand Sit to Stand: Min assist         General transfer comment: cues for hand placement  Ambulation/Gait Ambulation/Gait assistance: Min assist Ambulation Distance (Feet): 16 Feet Assistive device: Rolling walker (2 wheeled) Gait Pattern/deviations: Decreased step length - right;Decreased step length - left;Trunk flexed Gait velocity: slowly   General Gait Details: Amb with 02 intact. After gait and sitting on bed, nurse came in and said HR had elevated into 150's with some a fib,  At EOB o2 96% (on 3 L/min) and HR 120  Stairs             Wheelchair Mobility    Modified Rankin (Stroke Patients Only)       Balance Overall balance assessment: Needs assistance   Sitting balance-Leahy Scale: Fair       Standing balance-Leahy Scale: Poor                               Pertinent Vitals/Pain Pain Assessment: No/denies pain  o2 96% on 3 L/min and HR 120 after gait.  Nursing reports it increased to 150s with a fib during gait from telemetry.    Home Living Family/patient expects to be discharged to:: Private residence Living Arrangements: Children;Other relatives Available Help at Discharge: Available 24 hours/day;Family Type of Home: Apartment Home Access: Level entry     Home Layout: One level Home Equipment: Walker - 4 wheels;Walker - 2 wheels;Cane - single point;Wheelchair - manual      Prior Function Level of Independence: Independent with assistive device(s)               Hand Dominance        Extremity/Trunk Assessment   Upper Extremity Assessment: LUE deficits/detail       LUE Deficits / Details: Limited L UE ROM/strength   Lower Extremity Assessment: Overall WFL for tasks assessed;Generalized weakness      Cervical / Trunk Assessment: Normal  Communication   Communication: No difficulties  Cognition Arousal/Alertness: Awake/alert Behavior During Therapy: WFL for tasks assessed/performed Overall Cognitive  Status: Within Functional Limits for tasks assessed                      General Comments      Exercises        Assessment/Plan    PT Assessment Patient needs continued PT services  PT Diagnosis Difficulty walking;Generalized weakness   PT Problem List Decreased strength;Decreased balance;Decreased mobility;Decreased activity tolerance;Cardiopulmonary status limiting activity  PT Treatment Interventions Gait training;Functional mobility training;Therapeutic activities;Therapeutic exercise;Patient/family education   PT Goals (Current  goals can be found in the Care Plan section) Acute Rehab PT Goals Patient Stated Goal: to go home ASAP PT Goal Formulation: With patient Time For Goal Achievement: 11/07/14 Potential to Achieve Goals: Good    Frequency Min 3X/week   Barriers to discharge        Co-evaluation               End of Session Equipment Utilized During Treatment: Gait belt Activity Tolerance: Patient limited by fatigue Patient left: in bed;with call bell/phone within reach;with family/visitor present (pt going to procedure after PT) Nurse Communication: Mobility status         Time: [id number] PT Time Calculation (min) (ACUTE ONLY): 30 min   Charges:   PT Evaluation $Initial PT Evaluation Tier I: 1 Procedure PT Treatments $Gait Training: 8-22 mins   PT G Codes:      [name] L. Katrinka BlazingSmith, [place]PT Pager [phone] 10/31/2014   [name] LUBECK 10/31/2014, 2:47 PM

## 2014-10-31 NOTE — Clinical Social Work Placement (Addendum)
Clinical Social Work Department CLINICAL SOCIAL WORK PLACEMENT NOTE 10/31/2014  Patient:  [name],[name]  Account Number:  [id number] Admit date:  10/27/2014  Clinical Social Worker:  Cherre BlancJOSEPH BRYANT CAMPBELL, [place]WA  Date/time:  10/31/2014 05:00 PM  Clinical Social Work is seeking post-discharge placement for this patient at the following level of care:   SKILLED NURSING   (*CSW will update this form in Epic as items are completed)   10/31/2014  Patient/family provided with [facility] of Clinical Social Work's list of facilities offering this level of care within the geographic area requested by the patient (or if unable, by the patient's family).  10/31/2014  Patient/family informed of their freedom to choose among providers that offer the needed level of care, that participate in Medicare, Medicaid or managed care program needed by the patient, have an available bed and are willing to accept the patient.  10/31/2014  Patient/family informed of [facility]' ownership interest in [facility], as well as of the fact that they are under no obligation to receive care at this facility.  PASARR submitted to EDS on  PASARR number received on   FL2 transmitted to all facilities in geographic area requested by pt/family on  10/31/2014 FL2 transmitted to all facilities within larger geographic area on   Patient informed that his/her managed care company has contracts with or will negotiate with  certain facilities, including the following:     Patient/family informed of bed offers received: 11/01/2014  [facility] Physician recommends and patient chooses bed at    [facility]  11/[facility]/2014   Patient to be transferred to facility by PTAR Patient and family notified of transfer on 11/01/2014 Name of family member notified:  [name] (patient granddaughter at bedside)  The following physician  request were entered in Epic:   Additional Comments:    Roddie McBryant Campbell MSW, [place]CSWA, [place], [phone]

## 2014-10-31 NOTE — H&P (Signed)
Chief Complaint: Chief Complaint  Patient presents with  . Diarrhea  Slow running tunneled R IJ dialysis catheter  Referring Physician(s): Dr Lowell Guitar  History of Present Illness: [name] is a 81 y.o. female  Admitted 10/27/14 to hospital for diarrhea; weakness and wt loss +Cdiff 10/27/14 Treatment with Vanco/Flagyl ongoing---resolving nicely R IJ tunneled dialysis catheter placed at outside facility Pt not sure of timing----"several months ago" Noted slow flows while inpt Request made for replacement of tunneled hemodialysis catheter while inpatient I have seen and examined pt Hx CAD--on Plavix  Known R lung mass   Past Medical History  Diagnosis Date  . Hypertension   . Hyperlipidemia   . COPD (chronic obstructive pulmonary disease)   . Anemia   . ESRD (end stage renal disease) on dialysis   . CHF (congestive heart failure)   . GERD (gastroesophageal reflux disease)   . Constipation   . PEA (Pulseless electrical activity) 04/19/2012    PEA arrest complicated by hyperkalemia, PNA, anocic encephalopathy, VDRF    Past Surgical History  Procedure Laterality Date  . Abdominal hysterectomy    . Arteriovenous fistula  06/02/12    left radiocephalic - failed  . Arteriovenous graft placement  07/14/12    Created at [facility]    Allergies: Review of patient's allergies indicates no known allergies.  Medications: Prior to Admission medications   Medication Sig Start Date End Date Taking? Authorizing Provider  albuterol (PROVENTIL) (2.5 MG/3ML) 0.083% nebulizer solution Take 2.5 mg by nebulization every 6 (six) hours as needed for wheezing or shortness of breath.    Yes Historical Provider, MD  allopurinol (ZYLOPRIM) 100 MG tablet Take 100 mg by mouth daily.  09/05/11  Yes Historical Provider, MD  amiodarone (PACERONE) 200 MG tablet Take 1 tablet (200 mg total) by mouth daily. 08/05/14  Yes Hollice Espy, MD  arformoterol (BROVANA) 15 MCG/2ML NEBU Take 15 mcg by  nebulization 2 (two) times daily.   Yes Historical Provider, MD  aspirin 81 MG tablet Take 81 mg by mouth daily.    Yes Historical Provider, MD  budesonide (PULMICORT) 0.5 MG/2ML nebulizer solution Take 0.5 mg by nebulization 2 (two) times daily.   Yes Historical Provider, MD  midodrine (PROAMATINE) 10 MG tablet Take 1 tablet (10 mg total) by mouth 2 (two) times daily with a meal. 08/05/14  Yes Hollice Espy, MD  multivitamin (RENA-VIT) TABS tablet Take 1 tablet by mouth daily.   Yes Historical Provider, MD  pantoprazole (PROTONIX) 40 MG tablet Take 40 mg by mouth daily.   Yes Historical Provider, MD  senna (SENOKOT) 8.6 MG TABS Take 1 tablet by mouth daily.   Yes Historical Provider, MD  theophylline (THEODUR) 100 MG 12 hr tablet Take 100 mg by mouth 2 (two) times daily.   Yes Historical Provider, MD    No family history on file.  History   Social History  . Marital Status: Widowed    Spouse Name: N/A    Number of Children: N/A  . Years of Education: N/A   Social History Main Topics  . Smoking status: Former Smoker    Types: Cigarettes    Quit date: 10/07/2006  . Smokeless tobacco: None  . Alcohol Use: No  . Drug Use: No  . Sexual Activity: None   Other Topics Concern  . None   Social History Narrative     Review of Systems: A 12 point ROS discussed and pertinent positives are indicated in the HPI above.  All other systems are negative.  Review of Systems  Constitutional: Positive for activity change, appetite change and fatigue. Negative for fever.  Respiratory: Negative for cough, chest tightness and shortness of breath.   Cardiovascular: Negative for chest pain.  Gastrointestinal: Positive for nausea, abdominal pain and blood in stool.  Musculoskeletal: Negative for back pain and neck pain.  Neurological: Positive for weakness and light-headedness. Negative for dizziness, seizures and headaches.  Psychiatric/Behavioral: Negative for behavioral problems and  confusion.    Vital Signs: BP 127/65 mmHg  Pulse 107  Temp(Src) 97.7 F (36.5 C) (Oral)  Resp 16  Ht 5\' 5"  (1.651 m)  Wt 56.745 kg (125 lb 1.6 oz)  BMI 20.82 kg/m2  SpO2 96%  Physical Exam  Constitutional: She appears well-developed.  Cardiovascular: Normal rate and regular rhythm.   Pulmonary/Chest: Effort normal and breath sounds normal. She has no wheezes.  Abdominal: Soft. Bowel sounds are normal. There is no tenderness.  Musculoskeletal: Normal range of motion.  Neurological: She is alert.  Skin: Skin is warm.  Psychiatric: She has a normal mood and affect. Her behavior is normal. Judgment and thought content normal.  Nursing note and vitals reviewed.   Imaging: Ct Abdomen Pelvis Wo Contrast  10/28/2014   CLINICAL DATA:  Diarrhea for 3 weeks.  Rule out colitis.  EXAM: CT ABDOMEN AND PELVIS WITHOUT CONTRAST  TECHNIQUE: Multidetector CT imaging of the abdomen and pelvis was performed following the standard protocol without IV contrast.  COMPARISON:  CT 10/02/2014  FINDINGS: Chronic change at the lung bases. The heart is enlarged. There is a significant tortuosity of the distal thoracic aorta that appears similar to prior exam. No pleural effusion.  There is colonic wall thickening extending from the mid descending colon through the rectosigmoid colon. Associated pericolonic inflammatory change. Findings are consistent with colitis. No pneumatosis or perforation. Multiple descending and sigmoid colonic diverticula are again seen without peri-diverticular inflammatory change to suggest diverticulitis. There is no small bowel dilatation. The stomach is physiologically distended.  Layering gallstones within physiologically distended gallbladder. The unenhanced liver is unremarkable. The unenhanced spleen, pancreas, and adrenal glands are normal. There is renal atrophy without hydronephrosis. The previous right renal cyst is not well seen without contrast.  Large infrarenal abdominal aortic  aneurysm appear similar to prior exam. This measures 7.2 x 6.2 cm in biaxial dimension, however differences in size are likely related to differences in technique. There is no periaortic soft tissue stranding to suggest rupture. Dense atherosclerosis throughout the abdominal aorta and its branches again seen. No free air, free fluid, or intra-abdominal fluid collection.  Within the pelvis the urinary bladder is decompressed not well evaluated. The uterus not well seen, atrophic versus surgically absent. There is no adnexal mass.  Anterolisthesis of L4 on L5 and L5 on S1 with associated degenerative change, stable. Stable degenerative change of the pubic symphysis. No acute osseous abnormalities.  IMPRESSION: 1. Colitis involving the descending and sigmoid colon. Findings suggest an infectious or inflammatory etiology. There is diverticulosis throughout the colon without peri-diverticular inflammatory change to suggest diverticulitis. 2. Stable chronic findings include significant infrarenal abdominal aortic aneurysm. Current maximal dimension of 7.2 cm is minimally increased compared to prior where it measured 6.9 cm, however this is likely related to differences in technique. 3. Cholelithiasis.   Electronically Signed   By: Rubye OaksMelanie  Ehinger M.D.   On: 10/28/2014 05:59    Labs:  CBC:  Recent Labs  10/28/14 [id number] 10/29/14 0412 10/30/14 0500 10/31/14 [id number]  WBC 15.7* 13.8* 9.1 6.6  HGB 11.1* 9.6* 8.7* 9.5*  HCT 34.0* 29.4* 26.7* 29.2*  PLT 162 134* 135* 155    COAGS: No results for input(s): INR, APTT in the last 8760 hours.  BMP:  Recent Labs  10/28/14 0637 10/29/14 0412 10/30/14 0500 10/31/14 0705  NA 139 138 139 140  K 3.6 3.3* 3.1* 3.8  CL 103 105 107 109  CO2 22 20 18* 18*  GLUCOSE 74 60* 85 81  BUN 37* 34* 42* 44*  CALCIUM 9.0 8.3* 8.0* 8.5  CREATININE 9.22* 8.61* 9.62* 10.96*  GFRNONAA 3* 4* 3* 3*  GFRAA 4* 4* 4* 3*    LIVER FUNCTION TESTS:  Recent Labs  10/28/14 0637  10/29/14 0412 10/30/14 0500 10/31/14 0705  BILITOT 0.7 0.8 0.4 0.4  AST ALT ALKPHOS 56 57 39 41  PROT 6.4 5.4* 5.4* 6.0  ALBUMIN 2.7* 2.2* 2.2* 2.3*    TUMOR MARKERS: No results for input(s): AFPTM, CEA, CA199, CHROMGRNA in the last 8760 hours.  Assessment and Plan:  R IJ tunneled dialysis catheter with slow rate per Renal NP Scheduled for replacement of catheter while inpt Pt aware of procedure benefits and risks inclucding but not limited to: Infection; bleeding; vessel damage; damage to surrounding tissue Agreeable to proceed Consent signed andin chart On Plavix LD Hep inj this am 600 am NPO now; Hold Hep injs rest of 2/8 Afeb; day 5 of C diff tx   Thank you for this interesting consult.  I greatly enjoyed meeting [name] and look forward to participating in their care.  Signed: Catrena Vari A 10/31/2014, 9:39 AM   I spent a total of 20 minutes face to face in clinical consultation, greater than 50% of which was counseling/coordinating care for replacement/exchange tunneled HD cath

## 2014-10-31 NOTE — Progress Notes (Signed)
[facility] PROGRESS NOTE   [name] PieriniRuth [name] [id number] DOB: November 16, 1928 DOA: 10/27/2014 PCP: [name],[name], MD  HPI/Subjective: Feels much better, less bowel movements. To 3 bowel movements since yesterday morning. Seen by PT recommended home health PT.  Assessment/Plan: Principal Problem:   C. difficile colitis Active Problems:   ESRD on dialysis   Lung mass   Tobacco abuse   Diarrhea   Protein-calorie malnutrition, severe   ESRD (end stage renal disease) on dialysis   C. difficile colitis   Presented with diarrhea, slight nausea and vomiting. CT showed colitis involving the descending and sigmoid colon, Stool is positive for C. difficile by PCR. Will be treated as severe C. difficile colitis, with baseline creatinine above 1.5 and WBCs more than 15K on admission. Started on Flagyl in the ED, discontinued. Continue current management, improving, less diarrhea, expect discharge in a.m.  ESRD Nephrology consulted, patient getting dialysis on Monday, Wednesday and Friday. Patient will have discharge today, expect discharge in a.m.  Lung mass Right upper lobe mass measures 19 x 13 mm showed an CT scan on 08/02/2014. Patient aware about the mass, needs follow-up with outpatient.  History of tobacco abuse Counseled extensively.  Severe protein calorie malnutrition Dietitian consulted, to add dietary supplements.  Code Status: Full Code Family Communication: Plan discussed with the patient. Disposition Plan: Remains inpatient Diet: Diet NPO time specified Except for: Sips with Meds  Consultants:  Nephrology  Procedures:  Dialysis on Monday/Wednesday/Friday  Antibiotics:  Oral vancomycin   Objective: Filed Vitals:   10/31/14 1312  BP: 143/67  Pulse: 96  Temp: 97.9 F (36.6 C)  Resp: 24    Intake/Output Summary (Last 24 hours) at 10/31/14 1458 Last data filed at 10/31/14 1436  Gross per 24 hour  Intake    180 ml  Output    100 ml  Net     80  ml   Filed Weights   10/28/14 2033 10/29/14 0634 10/31/14 0539  Weight: 54.8 kg (120 lb 13 oz) 54.5 kg (120 lb 2.4 oz) 56.745 kg (125 lb 1.6 oz)    Exam: General: Alert and awake, oriented x3, not in any acute distress. HEENT: anicteric sclera, pupils reactive to light and accommodation, EOMI CVS: S1-S2 clear, no murmur rubs or gallops Chest: clear to auscultation bilaterally, no wheezing, rales or rhonchi Abdomen: soft nontender, nondistended, normal bowel sounds, no organomegaly Extremities: no cyanosis, clubbing or edema noted bilaterally Neuro: Cranial nerves II-XII intact, no focal neurological deficits  Data Reviewed: Basic Metabolic Panel:  Recent Labs Lab 10/27/14 2116 10/28/14 0637 10/29/14 0412 10/30/14 0500 10/31/14 0705  NA 140 139 138 139 140  K 4.4 3.6 3.3* 3.1* 3.8  CL 104 103 105 107 109  CO2 18* 22 20 18* 18*  GLUCOSE 74 74 60* 85 81  BUN 35* 37* 34* 42* 44*  CREATININE 9.09* 9.22* 8.61* 9.62* 10.96*  CALCIUM 9.8 9.0 8.3* 8.0* 8.5   Liver Function Tests:  Recent Labs Lab 10/27/14 2116 10/28/14 0637 10/29/14 0412 10/30/14 0500 10/31/14 0705  AST 53* 28 15 22 20   ALT 12 11 9 9 11   ALKPHOS 58 56 57 39 41  BILITOT 1.0 0.7 0.8 0.4 0.4  PROT 7.1 6.4 5.4* 5.4* 6.0  ALBUMIN 3.1* 2.7* 2.2* 2.2* 2.3*   No results for input(s): LIPASE, AMYLASE in the last 168 hours. No results for input(s): AMMONIA in the last 168 hours. CBC:  Recent Labs Lab 10/27/14 2116 10/28/14 [id number] 10/29/14 0412 10/30/14 0500 10/31/14 [id number]  WBC 17.7* 15.7* 13.8* 9.1 6.6  NEUTROABS 15.2* 13.5* 11.6* 7.1 5.1  HGB 12.3 11.1* 9.6* 8.7* 9.5*  HCT 37.0 34.0* 29.4* 26.7* 29.2*  MCV 92.7 92.9 91.9 91.1 93.6  PLT 145* 162 134* 135* 155   Cardiac Enzymes: No results for input(s): CKTOTAL, CKMB, CKMBINDEX, TROPONINI in the last 168 hours. BNP (last 3 results) No results for input(s): BNP in the last 8760 hours.  ProBNP (last 3 results) No results for input(s): PROBNP in the  last 8760 hours.  CBG: No results for input(s): GLUCAP in the last 168 hours.  Micro Recent Results (from the past 240 hour(s))  Stool culture     Status: None   Collection Time: 10/27/14 11:20 PM  Result Value Ref Range Status   Specimen Description STOOL  Final   Special Requests A  Final   Culture   Final    NO SALMONELLA, SHIGELLA, CAMPYLOBACTER, YERSINIA, OR E.COLI 0157:H7 ISOLATED Performed at [facility]    Report Status 10/31/2014 FINAL  Final  Clostridium Difficile by PCR     Status: Abnormal   Collection Time: 10/27/14 11:46 PM  Result Value Ref Range Status   C difficile by pcr POSITIVE (A) NEGATIVE Final    Comment: CRITICAL RESULT CALLED TO, READ BACK BY AND VERIFIED WITH: J. BOARD RN 10:55 10/28/14 ([email or web address])      Studies: No results found.  Scheduled Meds: . amiodarone  200 mg Oral Daily  . antiseptic oral rinse  7 mL Mouth Rinse BID  . arformoterol  15 mcg Nebulization BID  . aspirin EC  81 mg Oral Daily  . atorvastatin  20 mg Oral Daily  . budesonide  0.5 mg Nebulization BID  . calcitRIOL  0.25 mcg Oral Q M,W,F-HD  . clopidogrel  75 mg Oral Daily  . colchicine  0.3 mg Oral Once per day on Mon Thu  . darbepoetin (ARANESP) injection - DIALYSIS  60 mcg Intravenous Q Mon-HD  . famotidine  20 mg Oral QHS  . feeding supplement (RESOURCE BREEZE)  1 Container Oral BID BM  . fesoterodine  8 mg Oral Daily  . heparin  5,000 Units Subcutaneous 3 times per day  . midodrine  10 mg Oral BID WC  . multivitamin  1 tablet Oral QHS  . roflumilast  500 mcg Oral Daily  . sevelamer carbonate  1,600 mg Oral TID WC  . theophylline  100 mg Oral BID  . tiotropium  18 mcg Inhalation Daily  . vancomycin  125 mg Oral QID   Continuous Infusions:       Time spent: 35 minutes    [facility] A  [facility] Pager [phone] If 7PM-7AM, please contact night-coverage at [email or web address], password [facility] 10/31/2014, 2:58 PM  LOS: 4 days

## 2014-10-31 NOTE — Progress Notes (Signed)
Medicare Important Message given?  YES (If response is "NO", the following Medicare IM given date fields will be blank) Date Medicare IM given:  10/31/14 Medicare IM given by:  Ory Elting 

## 2014-10-31 NOTE — Progress Notes (Signed)
  [facility] Progress Note   Subjective: Working with PT, awaiting cath exchange today per IR  Filed Vitals:   10/30/14 2304 10/31/14 0539 10/31/14 0827 10/31/14 1312  BP: 115/52 127/65  143/67  Pulse: 107   96  Temp: 98.9 F (37.2 C) 97.7 F (36.5 C)  97.9 F (36.6 C)  TempSrc: Oral Oral    Resp: 20 16  24   Height:      Weight:  56.745 kg (125 lb 1.6 oz)    SpO2: 96% 98% 96% 100%   Exam: Alert, mildly dyspneic No JVD Chest dec'd BS throughout, no rales RRR tachy, irreg Abd soft, NTND No ascites 1+ L pedal edema, no other edema  HD:  MWF Ash 3hr 30 mins 57.5 kg 3K/2.25 ca 400/1.5 R IJ Cath 3000 u hep micera 75 q 2 weeks (next dose due 2/10) calcitirol 0.25    Assessment: 1. Cdif assoc diarrhea - with colitis on CT.  On po vanc, improving 2. Afib on amio/ coumadin 3. ESRD on HD MWF 4. HTN/vol - under dry wt, BP stable. On midodrine BID 5. COPD on home O12 6. Anemia cont aranesp 7. MBD cont renvela  Plan- HD today    Vinson Moselleob Jelani Vreeland MD  pager [phone]    cell [phone]  10/31/2014, 1:44 PM     Recent Labs Lab 10/29/14 0412 10/30/14 0500 10/31/14 0705  NA 138 139 140  K 3.3* 3.1* 3.8  CL 105 107 109  CO2 20 18* 18*  GLUCOSE 60* 85 81  BUN 34* 42* 44*  CREATININE 8.61* 9.62* 10.96*  CALCIUM 8.3* 8.0* 8.5    Recent Labs Lab 10/29/14 0412 10/30/14 0500 10/31/14 0705  AST 15 22 20   ALT 9 9 11   ALKPHOS 57 39 41  BILITOT 0.8 0.4 0.4  PROT 5.4* 5.4* 6.0  ALBUMIN 2.2* 2.2* 2.3*    Recent Labs Lab 10/29/14 0412 10/30/14 0500 10/31/14 0705  WBC 13.8* 9.1 6.6  NEUTROABS 11.6* 7.1 5.1  HGB 9.6* 8.7* 9.5*  HCT 29.4* 26.7* 29.2*  MCV 91.9 91.1 93.6  PLT 134* 135* 155   . amiodarone  200 mg Oral Daily  . antiseptic oral rinse  7 mL Mouth Rinse BID  . arformoterol  15 mcg Nebulization BID  . aspirin EC  81 mg Oral Daily  . atorvastatin  20 mg Oral Daily  . budesonide  0.5 mg Nebulization BID  . calcitRIOL  0.25 mcg  Oral Q M,W,F-HD  . clopidogrel  75 mg Oral Daily  . colchicine  0.3 mg Oral Once per day on Mon Thu  . darbepoetin (ARANESP) injection - DIALYSIS  60 mcg Intravenous Q Mon-HD  . famotidine  20 mg Oral QHS  . feeding supplement (RESOURCE BREEZE)  1 Container Oral BID BM  . fesoterodine  8 mg Oral Daily  . heparin  5,000 Units Subcutaneous 3 times per day  . midodrine  10 mg Oral BID WC  . multivitamin  1 tablet Oral QHS  . roflumilast  500 mcg Oral Daily  . sevelamer carbonate  1,600 mg Oral TID WC  . theophylline  100 mg Oral BID  . tiotropium  18 mcg Inhalation Daily  . vancomycin  125 mg Oral QID     acetaminophen, albuterol, ondansetron (ZOFRAN) IV

## 2014-10-31 NOTE — Procedures (Signed)
Procedure:  Dialysis catheter exchange Findings:  New 19 cm tip to cuff length Hemosplit catheter exchanged over guidewire.  Tips in RA. OK to use.

## 2014-11-01 DIAGNOSIS — B9689 Other specified bacterial agents as the cause of diseases classified elsewhere: Secondary | ICD-10-CM | POA: Diagnosis not present

## 2014-11-01 DIAGNOSIS — D6959 Other secondary thrombocytopenia: Secondary | ICD-10-CM | POA: Diagnosis not present

## 2014-11-01 DIAGNOSIS — Z681 Body mass index (BMI) 19 or less, adult: Secondary | ICD-10-CM | POA: Diagnosis not present

## 2014-11-01 DIAGNOSIS — R410 Disorientation, unspecified: Secondary | ICD-10-CM | POA: Diagnosis not present

## 2014-11-01 DIAGNOSIS — D689 Coagulation defect, unspecified: Secondary | ICD-10-CM | POA: Diagnosis not present

## 2014-11-01 DIAGNOSIS — E876 Hypokalemia: Secondary | ICD-10-CM | POA: Diagnosis not present

## 2014-11-01 DIAGNOSIS — N390 Urinary tract infection, site not specified: Secondary | ICD-10-CM | POA: Diagnosis not present

## 2014-11-01 DIAGNOSIS — E872 Acidosis: Secondary | ICD-10-CM | POA: Diagnosis not present

## 2014-11-01 DIAGNOSIS — Z515 Encounter for palliative care: Secondary | ICD-10-CM | POA: Diagnosis not present

## 2014-11-01 DIAGNOSIS — E889 Metabolic disorder, unspecified: Secondary | ICD-10-CM | POA: Diagnosis present

## 2014-11-01 DIAGNOSIS — E785 Hyperlipidemia, unspecified: Secondary | ICD-10-CM | POA: Diagnosis not present

## 2014-11-01 DIAGNOSIS — Z79899 Other long term (current) drug therapy: Secondary | ICD-10-CM | POA: Diagnosis not present

## 2014-11-01 DIAGNOSIS — B370 Candidal stomatitis: Secondary | ICD-10-CM | POA: Diagnosis not present

## 2014-11-01 DIAGNOSIS — E860 Dehydration: Secondary | ICD-10-CM | POA: Diagnosis not present

## 2014-11-01 DIAGNOSIS — I951 Orthostatic hypotension: Secondary | ICD-10-CM | POA: Diagnosis not present

## 2014-11-01 DIAGNOSIS — R5383 Other fatigue: Secondary | ICD-10-CM | POA: Diagnosis not present

## 2014-11-01 DIAGNOSIS — D649 Anemia, unspecified: Secondary | ICD-10-CM | POA: Diagnosis not present

## 2014-11-01 DIAGNOSIS — Z87891 Personal history of nicotine dependence: Secondary | ICD-10-CM | POA: Diagnosis not present

## 2014-11-01 DIAGNOSIS — Z961 Presence of intraocular lens: Secondary | ICD-10-CM | POA: Diagnosis not present

## 2014-11-01 DIAGNOSIS — R262 Difficulty in walking, not elsewhere classified: Secondary | ICD-10-CM | POA: Diagnosis not present

## 2014-11-01 DIAGNOSIS — I712 Thoracic aortic aneurysm, without rupture: Secondary | ICD-10-CM | POA: Diagnosis not present

## 2014-11-01 DIAGNOSIS — D631 Anemia in chronic kidney disease: Secondary | ICD-10-CM | POA: Diagnosis not present

## 2014-11-01 DIAGNOSIS — R64 Cachexia: Secondary | ICD-10-CM | POA: Diagnosis not present

## 2014-11-01 DIAGNOSIS — D696 Thrombocytopenia, unspecified: Secondary | ICD-10-CM | POA: Diagnosis not present

## 2014-11-01 DIAGNOSIS — N186 End stage renal disease: Secondary | ICD-10-CM | POA: Diagnosis not present

## 2014-11-01 DIAGNOSIS — Z4901 Encounter for fitting and adjustment of extracorporeal dialysis catheter: Secondary | ICD-10-CM | POA: Diagnosis not present

## 2014-11-01 DIAGNOSIS — Z992 Dependence on renal dialysis: Secondary | ICD-10-CM | POA: Diagnosis not present

## 2014-11-01 DIAGNOSIS — I509 Heart failure, unspecified: Secondary | ICD-10-CM | POA: Diagnosis present

## 2014-11-01 DIAGNOSIS — R531 Weakness: Secondary | ICD-10-CM | POA: Diagnosis not present

## 2014-11-01 DIAGNOSIS — R911 Solitary pulmonary nodule: Secondary | ICD-10-CM | POA: Diagnosis not present

## 2014-11-01 DIAGNOSIS — R197 Diarrhea, unspecified: Secondary | ICD-10-CM | POA: Diagnosis not present

## 2014-11-01 DIAGNOSIS — R0602 Shortness of breath: Secondary | ICD-10-CM | POA: Diagnosis not present

## 2014-11-01 DIAGNOSIS — J449 Chronic obstructive pulmonary disease, unspecified: Secondary | ICD-10-CM | POA: Diagnosis not present

## 2014-11-01 DIAGNOSIS — Z66 Do not resuscitate: Secondary | ICD-10-CM | POA: Diagnosis not present

## 2014-11-01 DIAGNOSIS — I253 Aneurysm of heart: Secondary | ICD-10-CM | POA: Diagnosis not present

## 2014-11-01 DIAGNOSIS — I120 Hypertensive chronic kidney disease with stage 5 chronic kidney disease or end stage renal disease: Secondary | ICD-10-CM | POA: Diagnosis not present

## 2014-11-01 DIAGNOSIS — R0902 Hypoxemia: Secondary | ICD-10-CM | POA: Diagnosis present

## 2014-11-01 DIAGNOSIS — R1311 Dysphagia, oral phase: Secondary | ICD-10-CM | POA: Diagnosis present

## 2014-11-01 DIAGNOSIS — Z8674 Personal history of sudden cardiac arrest: Secondary | ICD-10-CM | POA: Diagnosis not present

## 2014-11-01 DIAGNOSIS — N2581 Secondary hyperparathyroidism of renal origin: Secondary | ICD-10-CM | POA: Diagnosis not present

## 2014-11-01 DIAGNOSIS — K219 Gastro-esophageal reflux disease without esophagitis: Secondary | ICD-10-CM | POA: Diagnosis present

## 2014-11-01 DIAGNOSIS — Z720 Tobacco use: Secondary | ICD-10-CM | POA: Diagnosis not present

## 2014-11-01 DIAGNOSIS — Z90710 Acquired absence of both cervix and uterus: Secondary | ICD-10-CM | POA: Diagnosis not present

## 2014-11-01 DIAGNOSIS — I482 Chronic atrial fibrillation: Secondary | ICD-10-CM | POA: Diagnosis not present

## 2014-11-01 DIAGNOSIS — R627 Adult failure to thrive: Secondary | ICD-10-CM | POA: Diagnosis not present

## 2014-11-01 DIAGNOSIS — I714 Abdominal aortic aneurysm, without rupture: Secondary | ICD-10-CM | POA: Diagnosis not present

## 2014-11-01 DIAGNOSIS — A047 Enterocolitis due to Clostridium difficile: Secondary | ICD-10-CM | POA: Diagnosis not present

## 2014-11-01 DIAGNOSIS — R918 Other nonspecific abnormal finding of lung field: Secondary | ICD-10-CM | POA: Diagnosis not present

## 2014-11-01 DIAGNOSIS — E440 Moderate protein-calorie malnutrition: Secondary | ICD-10-CM | POA: Diagnosis not present

## 2014-11-01 DIAGNOSIS — I2510 Atherosclerotic heart disease of native coronary artery without angina pectoris: Secondary | ICD-10-CM | POA: Diagnosis not present

## 2014-11-01 DIAGNOSIS — M6281 Muscle weakness (generalized): Secondary | ICD-10-CM | POA: Diagnosis not present

## 2014-11-01 DIAGNOSIS — R1310 Dysphagia, unspecified: Secondary | ICD-10-CM | POA: Diagnosis not present

## 2014-11-01 DIAGNOSIS — R52 Pain, unspecified: Secondary | ICD-10-CM | POA: Diagnosis not present

## 2014-11-01 DIAGNOSIS — I716 Thoracoabdominal aortic aneurysm, without rupture: Secondary | ICD-10-CM | POA: Diagnosis present

## 2014-11-01 DIAGNOSIS — Z7982 Long term (current) use of aspirin: Secondary | ICD-10-CM | POA: Diagnosis not present

## 2014-11-01 DIAGNOSIS — E43 Unspecified severe protein-calorie malnutrition: Secondary | ICD-10-CM | POA: Diagnosis not present

## 2014-11-01 DIAGNOSIS — R031 Nonspecific low blood-pressure reading: Secondary | ICD-10-CM | POA: Diagnosis not present

## 2014-11-01 LAB — CBC WITH DIFFERENTIAL/PLATELET
BASOS PCT: 0 % (ref 0–1)
Basophils Absolute: 0 10*3/uL (ref 0.0–0.1)
Eosinophils Absolute: 0 10*3/uL (ref 0.0–0.7)
Eosinophils Relative: 0 % (ref 0–5)
HEMATOCRIT: 29.1 % — AB (ref 36.0–46.0)
HEMOGLOBIN: 9.6 g/dL — AB (ref 12.0–15.0)
LYMPHS ABS: 0.9 10*3/uL (ref 0.7–4.0)
Lymphocytes Relative: 13 % (ref 12–46)
MCH: 30 pg (ref 26.0–34.0)
MCHC: 33 g/dL (ref 30.0–36.0)
MCV: 90.9 fL (ref 78.0–100.0)
MONO ABS: 1.1 10*3/uL — AB (ref 0.1–1.0)
MONOS PCT: 15 % — AB (ref 3–12)
NEUTROS PCT: 72 % (ref 43–77)
Neutro Abs: 5.2 10*3/uL (ref 1.7–7.7)
Platelets: 184 10*3/uL (ref 150–400)
RBC: 3.2 MIL/uL — AB (ref 3.87–5.11)
RDW: 18.8 % — ABNORMAL HIGH (ref 11.5–15.5)
WBC: 7.2 10*3/uL (ref 4.0–10.5)

## 2014-11-01 LAB — COMPREHENSIVE METABOLIC PANEL
ALT: 12 U/L (ref 0–35)
ANION GAP: 14 (ref 5–15)
AST: 25 U/L (ref 0–37)
Albumin: 2.6 g/dL — ABNORMAL LOW (ref 3.5–5.2)
Alkaline Phosphatase: 46 U/L (ref 39–117)
BUN: 15 mg/dL (ref 6–23)
CO2: 22 mmol/L (ref 19–32)
Calcium: 8.9 mg/dL (ref 8.4–10.5)
Chloride: 103 mmol/L (ref 96–112)
Creatinine, Ser: 5.19 mg/dL — ABNORMAL HIGH (ref 0.50–1.10)
GFR calc Af Amer: 8 mL/min — ABNORMAL LOW (ref >90)
GFR, EST NON AFRICAN AMERICAN: 7 mL/min — AB (ref >90)
GLUCOSE: 84 mg/dL (ref 70–99)
Potassium: 3.3 mmol/L — ABNORMAL LOW (ref 3.5–5.1)
SODIUM: 139 mmol/L (ref 135–145)
Total Bilirubin: 0.6 mg/dL (ref 0.3–1.2)
Total Protein: 6 g/dL (ref 6.0–8.3)

## 2014-11-01 LAB — FECAL LACTOFERRIN, QUANT: FECAL LACTOFERRIN: POSITIVE

## 2014-11-01 LAB — OCCULT BLOOD X 1 CARD TO LAB, STOOL
Fecal Occult Bld: NEGATIVE
Fecal Occult Bld: POSITIVE — AB

## 2014-11-01 LAB — MAGNESIUM: Magnesium: 1.9 mg/dL (ref 1.5–2.5)

## 2014-11-01 MED ORDER — METOPROLOL TARTRATE 1 MG/ML IV SOLN
5.0000 mg | Freq: Once | INTRAVENOUS | Status: AC
  Administered 2014-11-01: 5 mg via INTRAVENOUS
  Filled 2014-11-01: qty 5

## 2014-11-01 MED ORDER — VANCOMYCIN 50 MG/ML ORAL SOLUTION: 125.0000 mg | Freq: Four times a day (QID) | ORAL | Status: DC

## 2014-11-01 NOTE — Clinical Social Work Note (Signed)
Clinical Social Worker facilitated patient discharge including contacting patient family and facility to confirm patient discharge plans.  Clinical information faxed to facility and family agreeable with plan.  CSW arranged ambulance transport via [facility] to [facility].  RN to call report prior to discharge.  Clinical Social Worker will sign off for now as social work intervention is no longer needed. Please consult us again if new need arises.  Jesse Thoma Paulsen, LCSW [phone] 

## 2014-11-01 NOTE — Progress Notes (Signed)
  [facility] Progress Note   Subjective: confused today.  BP's lower today than yest.   Filed Vitals:   11/01/14 0201 11/01/14 0500 11/01/14 0753 11/01/14 0953  BP: 118/51 102/46  102/41  Pulse: 99 89    Temp:  98.9 F (37.2 C)  97.5 F (36.4 C)  TempSrc:  Oral  Oral  Resp:      Height:      Weight:  53 kg (116 lb 13.5 oz)    SpO2: 96% 100% 97% 99%   Exam: Alert, mildly dyspneic No JVD Chest dec'd BS throughout, no rales RRR tachy, irreg Abd soft, NTND No ascites 1+ L pedal edema, no other edema  HD:  MWF Ash 3hr 30 mins 57.5 kg 3K/2.25 ca 400/1.5 R IJ Cath 3000 u hep micera 75 q 2 weeks (next dose due 2/10) calcitirol 0.25    Assessment: 1. Cdif assoc diarrhea - with colitis on CT.  On po vanc, improving 2. Confusion - pleasant. Did not get any sedation with cath procedure yest. May be due to lower BP's today.  Will avoid UF w HD tomorrow.   3. Afib on amio/ coumadin 4. ESRD on HD MWF 5. HTN/vol - under dry wt, BP stable. On midodrine BID 6. COPD on home O12 7. Anemia cont aranesp 8. MBD cont renvela  Plan- HD tomorrow, keep even.      Vinson Moselleob Israella Hubert MD  pager [phone]    cell [phone]  11/01/2014, 10:37 AM     Recent Labs Lab 10/30/14 0500 10/31/14 0705 11/01/14 0350  NA 139 140 139  K 3.1* 3.8 3.3*  CL 107 109 103  CO2 18* 18* 22  GLUCOSE 85 81 84  BUN 42* 44* 15  CREATININE 9.62* 10.96* 5.19*  CALCIUM 8.0* 8.5 8.9    Recent Labs Lab 10/30/14 0500 10/31/14 0705 11/01/14 0350  AST 22 20 25   ALT 9 11 12   ALKPHOS 39 41 46  BILITOT 0.4 0.4 0.6  PROT 5.4* 6.0 6.0  ALBUMIN 2.2* 2.3* 2.6*    Recent Labs Lab 10/30/14 0500 10/31/14 0705 11/01/14 0350  WBC 9.1 6.6 7.2  NEUTROABS 7.1 5.1 5.2  HGB 8.7* 9.5* 9.6*  HCT 26.7* 29.2* 29.1*  MCV 91.1 93.6 90.9  PLT 135* 155 184   . amiodarone  200 mg Oral Daily  . antiseptic oral rinse  7 mL Mouth Rinse BID  . arformoterol  15 mcg Nebulization BID  . aspirin EC   81 mg Oral Daily  . atorvastatin  20 mg Oral Daily  . budesonide  0.5 mg Nebulization BID  . calcitRIOL  0.25 mcg Oral Q M,W,F-HD  . clopidogrel  75 mg Oral Daily  . colchicine  0.3 mg Oral Once per day on Mon Thu  . darbepoetin (ARANESP) injection - DIALYSIS  60 mcg Intravenous Q Mon-HD  . famotidine  20 mg Oral QHS  . feeding supplement (RESOURCE BREEZE)  1 Container Oral BID BM  . fesoterodine  8 mg Oral Daily  . heparin  5,000 Units Subcutaneous 3 times per day  . midodrine  10 mg Oral BID WC  . multivitamin  1 tablet Oral QHS  . roflumilast  500 mcg Oral Daily  . sevelamer carbonate  1,600 mg Oral TID WC  . theophylline  100 mg Oral BID  . tiotropium  18 mcg Inhalation Daily  . vancomycin  125 mg Oral QID     acetaminophen, albuterol, ondansetron (ZOFRAN) IV

## 2014-11-01 NOTE — Progress Notes (Addendum)
Notified Camila LiOsman PA of patient's condition at this time. Patient noted to be extremely confused, stating that she is four months pregnant and heart rate continues to increase at this time. Patient noted to have history of A. Fib.  According to hemodialysis nurse, patient HR was in the 130's. Noted to have 1.479 L pulled at dialysis. Orders received.

## 2014-11-01 NOTE — Progress Notes (Signed)
Physical Therapy Treatment Patient Details Name: [name] MRN: [id number] DOB: Feb 19, 1929 Today's Date: 11/01/2014    History of Present Illness 81 y/ o BF with 3 week history of diarrhea with significant past medical history of ESRD on HD MWF, COPD, HTN, Afib.    PT Comments    Progressing slowly.  Needs encouragement to push herself to do a bit more.  Follow Up Recommendations  SNF (family requesting SNF now)     Equipment Recommendations  3in1 (PT)    Recommendations for Other Services       Precautions / Restrictions Precautions Precautions: Fall Restrictions Weight Bearing Restrictions: No    Mobility  Bed Mobility                  Transfers Overall transfer level: Needs assistance Equipment used: Rolling walker (2 wheeled) Transfers: Sit to/from Stand Sit to Stand: Min assist         General transfer comment: cues for hand placement  Ambulation/Gait Ambulation/Gait assistance: Min assist Ambulation Distance (Feet): 100 Feet Assistive device: Rolling walker (2 wheeled) Gait Pattern/deviations: Step-through pattern;Decreased step length - right;Decreased step length - left;Decreased stride length Gait velocity: slowly   General Gait Details: mildly unsteady gait, shuffled in nature.  Quickly fatigues, but only mild dyspnea.   Stairs            Wheelchair Mobility    Modified Rankin (Stroke Patients Only)       Balance     Sitting balance-Leahy Scale: Fair       Standing balance-Leahy Scale: Poor                      Cognition Arousal/Alertness: Awake/alert Behavior During Therapy: WFL for tasks assessed/performed Overall Cognitive Status: Within Functional Limits for tasks assessed                      Exercises      General Comments        Pertinent Vitals/Pain Pain Assessment: Faces Faces Pain Scale: Hurts even more Pain Location: shoulder pain left Pain Descriptors / Indicators: Grimacing Pain  Intervention(s): Limited activity within patient's tolerance    Home Living                      Prior Function            PT Goals (current goals can now be found in the care plan section) Acute Rehab PT Goals Patient Stated Goal: to go home ASAP PT Goal Formulation: With patient Time For Goal Achievement: 11/07/14 Potential to Achieve Goals: Good Progress towards PT goals: Progressing toward goals    Frequency  Min 3X/week    PT Plan Current plan remains appropriate    Co-evaluation             End of Session   Activity Tolerance: Patient limited by fatigue Patient left: with call bell/phone within reach;with family/visitor present;in chair     Time: [id number] PT Time Calculation (min) (ACUTE ONLY): 18 min  Charges:  $Gait Training: 8-22 mins                    G Codes:      [name], [name] 11/01/2014, 1:01 PM 11/01/2014  [place] [name], PT [phone]  (pager)

## 2014-11-01 NOTE — Discharge Summary (Signed)
Physician Discharge Summary  Naidelin Gugliotta ZOX:[id number] DOB: [birth year] DOA: 10/27/2014  PCP: [name], MD  Admit date: 10/27/2014 Discharge date: 11/01/2014  Time spent: 40 minutes  Recommendations for Outpatient Follow-up:  1. Follow up with nursing home M.D. 2. Continue oral vancomycin for 10 more days for C. difficile colitis treatment, PPI discontinued. 3. Needs referral to pulmonology for RUL lobulated mass suspicious for malignancy. Within 1-2 weeks.  Discharge Diagnoses:  Principal Problem:   C. difficile colitis Active Problems:   Lung mass   Tobacco abuse   Diarrhea   Protein-calorie malnutrition, severe   ESRD (end stage renal disease) on dialysis   Discharge Condition: Stable  Diet recommendation: Heart healthy with 1200 mL of fluid restriction per day  Filed Weights   10/31/14 1729 10/31/14 2103 11/01/14 0500  Weight: 57.5 kg (126 lb 12.2 oz) 55 kg (121 lb 4.1 oz) 53 kg (116 lb 13.5 oz)    History of present illness:  This is a 81 y.o. year old female with significant past medical history of ESRD on HD MWF, COPD, HTN, Afib presenting with diarrhea. Pt reports 3 weeks of progressive diarrhea. No nausea, vomiting, fevers, chills. Pt denies any abd pain. Has been tolerating dialysis fairly well. Went to dialysis yesterday with minimal to mild weakness at the end of session. Diarrhea has been intermittently dark. Patient on sure if it is melanotic/black tarry. Last antibiotic use was during last admission November 2015 for encephalopathy and acute respiratory failure in the setting of UTI. No known sick contacts. Presents to the ER temperature 98.4, heart rate in the 80s to 90s, respirations and intense 20s, blood pressure in the 90s to 130s over 50s. Satting 100% on room air. White blood cell count 17.7, hemoglobin 12.3. Creatinine 9.09. Bicarbonate 18. Platelets 145. C. difficile obtained in the ER. CT of the abdomen and pelvis with oral contrast pending given palpable  abdominal pain on exam in the ER. Started on IV Flagyl empirically.  Hospital Course:    C. difficile colitis  Presented with diarrhea, slight nausea and vomiting. CT showed colitis involving the descending and sigmoid colon, Stool is positive for C. difficile by PCR. Has been treated as severe C. difficile colitis, with baseline creatinine above 1.5 and WBCs more than 15K on admission. Started on Flagyl in the ED, discontinued. Continue oral vancomycin for 10 more days.  ESRD Nephrology consulted, patient getting dialysis on Monday, Wednesday and Friday.  Lung mass Right upper lobe mass measures 1.3 x 2.0 x 1.4 mm showed an CT scan on 08/02/2014. Discussed with the patient and family, patient needs follow-up with pulmonology as outpatient  History of tobacco abuse Counseled extensively.  Severe protein calorie malnutrition Dietitian consulted, to add dietary supplements.  History of atrial fibrillation Patient has paroxysmal atrial fibrillation, she is on amiodarone. According to the previous torch is not a candidate for anticoagulation. She is on aspirin.  Confusion The night prior to discharge patient developed some confusion, likely secondary to sundowning/medications affect. She is improved, but every now and then she will be confused for some time. I think this is likely sundowning.  Procedures:  None  Consultations:  Nephrology  Discharge Exam: Filed Vitals:   11/01/14 0953  BP: 102/41  Pulse:   Temp: 97.5 F (36.4 C)  Resp:    General: Alert and awake, mild confusion this morning, not in any acute distress. HEENT: anicteric sclera, pupils reactive to light and accommodation, EOMI CVS: S1-S2 clear, no murmur rubs or gallops  Chest: clear to auscultation bilaterally, no wheezing, rales or rhonchi Abdomen: soft nontender, nondistended, normal bowel sounds, no organomegaly Extremities: no cyanosis, clubbing or edema noted bilaterally Neuro: Cranial nerves  II-XII intact, no focal neurological deficits   Discharge Instructions   Discharge Instructions    Diet - low sodium heart healthy    Complete by:  As directed      Increase activity slowly    Complete by:  As directed           Current Discharge Medication List    START taking these medications   Details  vancomycin (VANCOCIN) 50 mg/mL oral solution Take 2.5 mLs (125 mg total) by mouth 4 (four) times daily.      CONTINUE these medications which have NOT CHANGED   Details  albuterol (PROVENTIL) (2.5 MG/3ML) 0.083% nebulizer solution Take 2.5 mg by nebulization every 6 (six) hours as needed for wheezing or shortness of breath.     allopurinol (ZYLOPRIM) 100 MG tablet Take 100 mg by mouth daily.     amiodarone (PACERONE) 200 MG tablet Take 1 tablet (200 mg total) by mouth daily. Qty: 30 tablet, Refills: 1    arformoterol (BROVANA) 15 MCG/2ML NEBU Take 15 mcg by nebulization 2 (two) times daily.    aspirin 81 MG tablet Take 81 mg by mouth daily.     budesonide (PULMICORT) 0.5 MG/2ML nebulizer solution Take 0.5 mg by nebulization 2 (two) times daily.    midodrine (PROAMATINE) 10 MG tablet Take 1 tablet (10 mg total) by mouth 2 (two) times daily with a meal. Qty: 60 tablet, Refills: 1    multivitamin (RENA-VIT) TABS tablet Take 1 tablet by mouth daily.    senna (SENOKOT) 8.6 MG TABS Take 1 tablet by mouth daily.    theophylline (THEODUR) 100 MG 12 hr tablet Take 100 mg by mouth 2 (two) times daily.      STOP taking these medications     pantoprazole (PROTONIX) 40 MG tablet      atorvastatin (LIPITOR) 20 MG tablet      clopidogrel (PLAVIX) 75 MG tablet      colchicine 0.6 MG tablet      DALIRESP 500 MCG TABS tablet      DETROL LA 4 MG 24 hr capsule      tiotropium (SPIRIVA) 18 MCG inhalation capsule        No Known Allergies Follow-up Information    Follow up with HODGES,[name], MD In 1 week.   Specialty:  Family Medicine      Follow up with [facility]  PULMONARY CARE In 1 week.   Contact information:   [address] [place] [address]        The results of significant diagnostics from this hospitalization (including imaging, microbiology, ancillary and laboratory) are listed below for reference.    Significant Diagnostic Studies: Ct Abdomen Pelvis Wo Contrast  10/28/2014   CLINICAL DATA:  Diarrhea for 3 weeks.  Rule out colitis.  EXAM: CT ABDOMEN AND PELVIS WITHOUT CONTRAST  TECHNIQUE: Multidetector CT imaging of the abdomen and pelvis was performed following the standard protocol without IV contrast.  COMPARISON:  CT 10/02/2014  FINDINGS: Chronic change at the lung bases. The heart is enlarged. There is a significant tortuosity of the distal thoracic aorta that appears similar to prior exam. No pleural effusion.  There is colonic wall thickening extending from the mid descending colon through the rectosigmoid colon. Associated pericolonic inflammatory change. Findings are consistent with colitis.  No pneumatosis or perforation. Multiple descending and sigmoid colonic diverticula are again seen without peri-diverticular inflammatory change to suggest diverticulitis. There is no small bowel dilatation. The stomach is physiologically distended.  Layering gallstones within physiologically distended gallbladder. The unenhanced liver is unremarkable. The unenhanced spleen, pancreas, and adrenal glands are normal. There is renal atrophy without hydronephrosis. The previous right renal cyst is not well seen without contrast.  Large infrarenal abdominal aortic aneurysm appear similar to prior exam. This measures 7.2 x 6.2 cm in biaxial dimension, however differences in size are likely related to differences in technique. There is no periaortic soft tissue stranding to suggest rupture. Dense atherosclerosis throughout the abdominal aorta and its branches again seen. No free air, free fluid, or intra-abdominal fluid collection.  Within the  pelvis the urinary bladder is decompressed not well evaluated. The uterus not well seen, atrophic versus surgically absent. There is no adnexal mass.  Anterolisthesis of L4 on L5 and L5 on S1 with associated degenerative change, stable. Stable degenerative change of the pubic symphysis. No acute osseous abnormalities.  IMPRESSION: 1. Colitis involving the descending and sigmoid colon. Findings suggest an infectious or inflammatory etiology. There is diverticulosis throughout the colon without peri-diverticular inflammatory change to suggest diverticulitis. 2. Stable chronic findings include significant infrarenal abdominal aortic aneurysm. Current maximal dimension of 7.2 cm is minimally increased compared to prior where it measured 6.9 cm, however this is likely related to differences in technique. 3. Cholelithiasis.   Electronically Signed   By: Rubye Oaks M.D.   On: 10/28/2014 05:59   Ir Fluoro Guide Cv Line Right  10/31/2014   CLINICAL DATA:  Poorly functioning tunneled right jugular dialysis catheter.  EXAM: EXCHANGE OF TUNNELED CENTRAL VENOUS HEMODIALYSIS CATHETER UNDER FLUOROSCOPIC GUIDANCE  ANESTHESIA/SEDATION: Local anesthesia with 1% lidocaine.  MEDICATIONS: No additional medications.  FLUOROSCOPY TIME:  1 min and 16 seconds.  PROCEDURE: The procedure, risks, benefits, and alternatives were explained to the patient. Questions regarding the procedure were encouraged and answered. The patient understands and consents to the procedure.  The preexisting dialysis catheter and surrounding skin were prepped with chlorhexidine in a sterile fashion, and a sterile drape was applied covering the operative field. Maximum barrier sterile technique with sterile gowns and gloves were used for the procedure. Local anesthesia was provided with 1% lidocaine. A time-out was performed prior to the procedure.  The preexisting dialysis catheter was removed over a guidewire after freeing the subcutaneous cuff using blunt  dissection.  A new Bard Hemosplit tunneled hemodialysis catheter measuring 19 cm from tip to cuff was chosen for placement. The venotomy was dilated with 14 [place] and 16 [place] dilators prior to advancing the new catheter. The tunneled catheter was advanced over the guidewire under fluoroscopy.  Final catheter positioning was confirmed and documented with a fluoroscopic spot image. The catheter was aspirated, flushed with saline, and injected with appropriate volume heparin dwells.  COMPLICATIONS: None.  No pneumothorax.  FINDINGS: After removal of the pre-existing catheter, it was difficult to advance a new tunneled catheter through the venotomy, likely related to vein stenosis and fibrin material. The venotomy had to be dilated up to 16 [place] in order to allow advancement of a new tunneled catheter. After catheter placement, the tips lie in the right atrium. The catheter aspirates normally and is ready for immediate use.  IMPRESSION: Exchange of tunneled hemodialysis catheter via the right internal jugular vein. The catheter tips lie in the right atrium. The catheter is ready for immediate use.  Electronically Signed   By: Irish LackGlenn  Yamagata M.D.   On: 10/31/2014 17:25    Microbiology: Recent Results (from the past 240 hour(s))  Stool culture     Status: None   Collection Time: 10/27/14 11:20 PM  Result Value Ref Range Status   Specimen Description STOOL  Final   Special Requests A  Final   Culture   Final    NO SALMONELLA, SHIGELLA, CAMPYLOBACTER, YERSINIA, OR E.COLI 0157:H7 ISOLATED Performed at [facility]    Report Status 10/31/2014 FINAL  Final  Clostridium Difficile by PCR     Status: Abnormal   Collection Time: 10/27/14 11:46 PM  Result Value Ref Range Status   C difficile by pcr POSITIVE (A) NEGATIVE Final    Comment: CRITICAL RESULT CALLED TO, READ BACK BY AND VERIFIED WITH: J. BOARD RN 10:55 10/28/14 ([email or web address])      Labs: Basic Metabolic Panel:  Recent Labs Lab  10/28/14 [id number] 10/29/14 0412 10/30/14 0500 10/31/14 0705 11/01/14 0350  NA 139 138 139 140 139  K 3.6 3.3* 3.1* 3.8 3.3*  CL 103 105 107 109 103  CO2 22 20 18* 18* 22  GLUCOSE 74 60* 85 81 84  BUN 37* 34* 42* 44* 15  CREATININE 9.22* 8.61* 9.62* 10.96* 5.19*  CALCIUM 9.0 8.3* 8.0* 8.5 8.9  MG  --   --   --   --  1.9   Liver Function Tests:  Recent Labs Lab 10/28/14 0637 10/29/14 0412 10/30/14 0500 10/31/14 0705 11/01/14 0350  AST 28 15 22 20 25   ALT 11 9 9 11 12   ALKPHOS 56 57 39 41 46  BILITOT 0.7 0.8 0.4 0.4 0.6  PROT 6.4 5.4* 5.4* 6.0 6.0  ALBUMIN 2.7* 2.2* 2.2* 2.3* 2.6*   No results for input(s): LIPASE, AMYLASE in the last 168 hours. No results for input(s): AMMONIA in the last 168 hours. CBC:  Recent Labs Lab 10/28/14 0637 10/29/14 0412 10/30/14 0500 10/31/14 0705 11/01/14 0350  WBC 15.7* 13.8* 9.1 6.6 7.2  NEUTROABS 13.5* 11.6* 7.1 5.1 5.2  HGB 11.1* 9.6* 8.7* 9.5* 9.6*  HCT 34.0* 29.4* 26.7* 29.2* 29.1*  MCV 92.9 91.9 91.1 93.6 90.9  PLT 162 134* 135* 155 184   Cardiac Enzymes: No results for input(s): CKTOTAL, CKMB, CKMBINDEX, TROPONINI in the last 168 hours. BNP: BNP (last 3 results) No results for input(s): BNP in the last 8760 hours.  ProBNP (last 3 results) No results for input(s): PROBNP in the last 8760 hours.  CBG: No results for input(s): GLUCAP in the last 168 hours.     Signed:  Manjot Beumer A  [facility] 11/01/2014, 12:34 PM

## 2014-11-01 NOTE — Progress Notes (Signed)
RN called [facility] Reece Levy(Deb).

## 2014-11-01 NOTE — Progress Notes (Addendum)
Patient HR went back up into the 170's from trying to get OOB with no oxygen on. [facility]. Placed nasal cannula on patient.  Assisted back to bed and calmed down. Patient HR currently at 107. Will continue to monitor accordingly.   4:53 Patient resting quietly.
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
## 2014-11-01 NOTE — Progress Notes (Signed)
Shift Event: Paged secondary to pt's HR in 170s, other VSS.  EKG- supraventricular tachycardia with PVCs. Will ordered 5mg  IV Metoprolol, and assess need for adenosine.  Update: at bedside, pt is A and Ox3, in NAD. Lungs-good air movement bilat Heart-tachycardia. Pt's HR back in 80-90s, with no further intervention needed  Illa LevelSahar Takeesha Isley [facility]

## 2014-11-01 NOTE — Progress Notes (Signed)
EKG obtained, HR continues to increase. Notified Osman PA of EKG results. Will continue to monitor.

## 2014-11-02 DIAGNOSIS — A047 Enterocolitis due to Clostridium difficile: Secondary | ICD-10-CM | POA: Diagnosis not present

## 2014-11-02 DIAGNOSIS — Z4901 Encounter for fitting and adjustment of extracorporeal dialysis catheter: Secondary | ICD-10-CM | POA: Diagnosis not present

## 2014-11-02 DIAGNOSIS — D689 Coagulation defect, unspecified: Secondary | ICD-10-CM | POA: Diagnosis not present

## 2014-11-02 DIAGNOSIS — N186 End stage renal disease: Secondary | ICD-10-CM | POA: Diagnosis not present

## 2014-11-02 DIAGNOSIS — E876 Hypokalemia: Secondary | ICD-10-CM | POA: Diagnosis not present

## 2014-11-02 DIAGNOSIS — N2581 Secondary hyperparathyroidism of renal origin: Secondary | ICD-10-CM | POA: Diagnosis not present

## 2014-11-02 DIAGNOSIS — D631 Anemia in chronic kidney disease: Secondary | ICD-10-CM | POA: Diagnosis not present

## 2014-11-02 DIAGNOSIS — R918 Other nonspecific abnormal finding of lung field: Secondary | ICD-10-CM | POA: Diagnosis not present

## 2014-11-02 DIAGNOSIS — R52 Pain, unspecified: Secondary | ICD-10-CM | POA: Diagnosis not present

## 2014-11-02 DIAGNOSIS — J449 Chronic obstructive pulmonary disease, unspecified: Secondary | ICD-10-CM | POA: Diagnosis not present

## 2014-11-03 NOTE — Care Management Note (Signed)
    Page 1 of 1   11/03/2014     4:08:48 PM CARE MANAGEMENT NOTE 11/03/2014  [facility]   Account Number:  [id number]  Date Initiated:  11/01/2014  Documentation initiated by:  Letha CapeAYLOR,Jefry Lesinski  Subjective/Objective Assessment:   dx cdiff  admit- lives alone     Action/Plan:   pt evasl- rec hhpt and 3 n 1   Anticipated DC Date:  11/01/2014   Anticipated DC Plan:  SKILLED NURSING FACILITY  In-house referral  Clinical Social Worker      DC Planning Services  CM consult      Choice offered to / List presented to:             Status of service:  Completed, signed off Medicare Important Message given?  YES (If response is "NO", the following Medicare IM given date fields will be blank) Date Medicare IM given:  10/31/2014 Medicare IM given by:  Letha CapeAYLOR,Jisell Majer Date Additional Medicare IM given:   Additional Medicare IM given by:    Discharge Disposition:  SKILLED NURSING FACILITY  Per UR Regulation:  Reviewed for med. necessity/level of care/duration of stay  If discussed at Long Length of Stay Meetings, dates discussed:    Comments:

## 2014-11-04 DIAGNOSIS — N2581 Secondary hyperparathyroidism of renal origin: Secondary | ICD-10-CM | POA: Diagnosis not present

## 2014-11-04 DIAGNOSIS — Z4901 Encounter for fitting and adjustment of extracorporeal dialysis catheter: Secondary | ICD-10-CM | POA: Diagnosis not present

## 2014-11-04 DIAGNOSIS — R52 Pain, unspecified: Secondary | ICD-10-CM | POA: Diagnosis not present

## 2014-11-04 DIAGNOSIS — E876 Hypokalemia: Secondary | ICD-10-CM | POA: Diagnosis not present

## 2014-11-04 DIAGNOSIS — D631 Anemia in chronic kidney disease: Secondary | ICD-10-CM | POA: Diagnosis not present

## 2014-11-04 DIAGNOSIS — D689 Coagulation defect, unspecified: Secondary | ICD-10-CM | POA: Diagnosis not present

## 2014-11-04 DIAGNOSIS — N186 End stage renal disease: Secondary | ICD-10-CM | POA: Diagnosis not present

## 2014-11-07 DIAGNOSIS — D631 Anemia in chronic kidney disease: Secondary | ICD-10-CM | POA: Diagnosis not present

## 2014-11-07 DIAGNOSIS — N2581 Secondary hyperparathyroidism of renal origin: Secondary | ICD-10-CM | POA: Diagnosis not present

## 2014-11-07 DIAGNOSIS — E876 Hypokalemia: Secondary | ICD-10-CM | POA: Diagnosis not present

## 2014-11-07 DIAGNOSIS — D689 Coagulation defect, unspecified: Secondary | ICD-10-CM | POA: Diagnosis not present

## 2014-11-07 DIAGNOSIS — Z4901 Encounter for fitting and adjustment of extracorporeal dialysis catheter: Secondary | ICD-10-CM | POA: Diagnosis not present

## 2014-11-07 DIAGNOSIS — N186 End stage renal disease: Secondary | ICD-10-CM | POA: Diagnosis not present

## 2014-11-07 DIAGNOSIS — R52 Pain, unspecified: Secondary | ICD-10-CM | POA: Diagnosis not present

## 2014-11-09 DIAGNOSIS — R52 Pain, unspecified: Secondary | ICD-10-CM | POA: Diagnosis not present

## 2014-11-09 DIAGNOSIS — N186 End stage renal disease: Secondary | ICD-10-CM | POA: Diagnosis not present

## 2014-11-09 DIAGNOSIS — D689 Coagulation defect, unspecified: Secondary | ICD-10-CM | POA: Diagnosis not present

## 2014-11-09 DIAGNOSIS — Z4901 Encounter for fitting and adjustment of extracorporeal dialysis catheter: Secondary | ICD-10-CM | POA: Diagnosis not present

## 2014-11-09 DIAGNOSIS — D631 Anemia in chronic kidney disease: Secondary | ICD-10-CM | POA: Diagnosis not present

## 2014-11-09 DIAGNOSIS — E876 Hypokalemia: Secondary | ICD-10-CM | POA: Diagnosis not present

## 2014-11-09 DIAGNOSIS — N2581 Secondary hyperparathyroidism of renal origin: Secondary | ICD-10-CM | POA: Diagnosis not present

## 2014-11-11 DIAGNOSIS — N186 End stage renal disease: Secondary | ICD-10-CM | POA: Diagnosis not present

## 2014-11-11 DIAGNOSIS — N2581 Secondary hyperparathyroidism of renal origin: Secondary | ICD-10-CM | POA: Diagnosis not present

## 2014-11-11 DIAGNOSIS — E876 Hypokalemia: Secondary | ICD-10-CM | POA: Diagnosis not present

## 2014-11-11 DIAGNOSIS — R52 Pain, unspecified: Secondary | ICD-10-CM | POA: Diagnosis not present

## 2014-11-11 DIAGNOSIS — Z4901 Encounter for fitting and adjustment of extracorporeal dialysis catheter: Secondary | ICD-10-CM | POA: Diagnosis not present

## 2014-11-11 DIAGNOSIS — D631 Anemia in chronic kidney disease: Secondary | ICD-10-CM | POA: Diagnosis not present

## 2014-11-11 DIAGNOSIS — D689 Coagulation defect, unspecified: Secondary | ICD-10-CM | POA: Diagnosis not present

## 2014-11-14 DIAGNOSIS — N2581 Secondary hyperparathyroidism of renal origin: Secondary | ICD-10-CM | POA: Diagnosis not present

## 2014-11-14 DIAGNOSIS — E876 Hypokalemia: Secondary | ICD-10-CM | POA: Diagnosis not present

## 2014-11-14 DIAGNOSIS — Z4901 Encounter for fitting and adjustment of extracorporeal dialysis catheter: Secondary | ICD-10-CM | POA: Diagnosis not present

## 2014-11-14 DIAGNOSIS — D689 Coagulation defect, unspecified: Secondary | ICD-10-CM | POA: Diagnosis not present

## 2014-11-14 DIAGNOSIS — R52 Pain, unspecified: Secondary | ICD-10-CM | POA: Diagnosis not present

## 2014-11-14 DIAGNOSIS — D631 Anemia in chronic kidney disease: Secondary | ICD-10-CM | POA: Diagnosis not present

## 2014-11-14 DIAGNOSIS — N186 End stage renal disease: Secondary | ICD-10-CM | POA: Diagnosis not present

## 2014-11-16 DIAGNOSIS — R52 Pain, unspecified: Secondary | ICD-10-CM | POA: Diagnosis not present

## 2014-11-16 DIAGNOSIS — D689 Coagulation defect, unspecified: Secondary | ICD-10-CM | POA: Diagnosis not present

## 2014-11-16 DIAGNOSIS — D631 Anemia in chronic kidney disease: Secondary | ICD-10-CM | POA: Diagnosis not present

## 2014-11-16 DIAGNOSIS — E876 Hypokalemia: Secondary | ICD-10-CM | POA: Diagnosis not present

## 2014-11-16 DIAGNOSIS — Z4901 Encounter for fitting and adjustment of extracorporeal dialysis catheter: Secondary | ICD-10-CM | POA: Diagnosis not present

## 2014-11-16 DIAGNOSIS — N186 End stage renal disease: Secondary | ICD-10-CM | POA: Diagnosis not present

## 2014-11-16 DIAGNOSIS — N2581 Secondary hyperparathyroidism of renal origin: Secondary | ICD-10-CM | POA: Diagnosis not present

## 2014-11-17 DIAGNOSIS — Z79899 Other long term (current) drug therapy: Secondary | ICD-10-CM | POA: Diagnosis not present

## 2014-11-17 DIAGNOSIS — Z961 Presence of intraocular lens: Secondary | ICD-10-CM | POA: Diagnosis not present

## 2014-11-18 DIAGNOSIS — D689 Coagulation defect, unspecified: Secondary | ICD-10-CM | POA: Diagnosis not present

## 2014-11-18 DIAGNOSIS — R52 Pain, unspecified: Secondary | ICD-10-CM | POA: Diagnosis not present

## 2014-11-18 DIAGNOSIS — E876 Hypokalemia: Secondary | ICD-10-CM | POA: Diagnosis not present

## 2014-11-18 DIAGNOSIS — D631 Anemia in chronic kidney disease: Secondary | ICD-10-CM | POA: Diagnosis not present

## 2014-11-18 DIAGNOSIS — Z4901 Encounter for fitting and adjustment of extracorporeal dialysis catheter: Secondary | ICD-10-CM | POA: Diagnosis not present

## 2014-11-18 DIAGNOSIS — N186 End stage renal disease: Secondary | ICD-10-CM | POA: Diagnosis not present

## 2014-11-18 DIAGNOSIS — N2581 Secondary hyperparathyroidism of renal origin: Secondary | ICD-10-CM | POA: Diagnosis not present

## 2014-11-21 DIAGNOSIS — D631 Anemia in chronic kidney disease: Secondary | ICD-10-CM | POA: Diagnosis not present

## 2014-11-21 DIAGNOSIS — E876 Hypokalemia: Secondary | ICD-10-CM | POA: Diagnosis not present

## 2014-11-21 DIAGNOSIS — Z992 Dependence on renal dialysis: Secondary | ICD-10-CM | POA: Diagnosis not present

## 2014-11-21 DIAGNOSIS — D689 Coagulation defect, unspecified: Secondary | ICD-10-CM | POA: Diagnosis not present

## 2014-11-21 DIAGNOSIS — N186 End stage renal disease: Secondary | ICD-10-CM | POA: Diagnosis not present

## 2014-11-21 DIAGNOSIS — Z4901 Encounter for fitting and adjustment of extracorporeal dialysis catheter: Secondary | ICD-10-CM | POA: Diagnosis not present

## 2014-11-21 DIAGNOSIS — N2581 Secondary hyperparathyroidism of renal origin: Secondary | ICD-10-CM | POA: Diagnosis not present

## 2014-11-21 DIAGNOSIS — R52 Pain, unspecified: Secondary | ICD-10-CM | POA: Diagnosis not present

## 2014-11-22 DIAGNOSIS — I253 Aneurysm of heart: Secondary | ICD-10-CM | POA: Diagnosis not present

## 2014-11-22 DIAGNOSIS — I712 Thoracic aortic aneurysm, without rupture: Secondary | ICD-10-CM | POA: Diagnosis not present

## 2014-11-23 ENCOUNTER — Emergency Department (HOSPITAL_COMMUNITY): Payer: Medicare Other

## 2014-11-23 ENCOUNTER — Encounter (HOSPITAL_COMMUNITY): Payer: Self-pay | Admitting: Emergency Medicine

## 2014-11-23 ENCOUNTER — Inpatient Hospital Stay (HOSPITAL_COMMUNITY)
Admission: EM | Admit: 2014-11-23 | Discharge: 2014-11-30 | DRG: 371 | Disposition: A | Discharge status: Skilled Nursing Facility | Payer: Medicare Other | Attending: Internal Medicine | Admitting: Internal Medicine

## 2014-11-23 DIAGNOSIS — R031 Nonspecific low blood-pressure reading: Secondary | ICD-10-CM | POA: Diagnosis not present

## 2014-11-23 DIAGNOSIS — Z7189 Other specified counseling: Secondary | ICD-10-CM

## 2014-11-23 DIAGNOSIS — E785 Hyperlipidemia, unspecified: Secondary | ICD-10-CM | POA: Diagnosis not present

## 2014-11-23 DIAGNOSIS — B370 Candidal stomatitis: Secondary | ICD-10-CM | POA: Diagnosis present

## 2014-11-23 DIAGNOSIS — R1311 Dysphagia, oral phase: Secondary | ICD-10-CM | POA: Diagnosis present

## 2014-11-23 DIAGNOSIS — D649 Anemia, unspecified: Secondary | ICD-10-CM | POA: Diagnosis present

## 2014-11-23 DIAGNOSIS — I120 Hypertensive chronic kidney disease with stage 5 chronic kidney disease or end stage renal disease: Secondary | ICD-10-CM | POA: Diagnosis not present

## 2014-11-23 DIAGNOSIS — R1310 Dysphagia, unspecified: Secondary | ICD-10-CM

## 2014-11-23 DIAGNOSIS — I2510 Atherosclerotic heart disease of native coronary artery without angina pectoris: Secondary | ICD-10-CM | POA: Diagnosis not present

## 2014-11-23 DIAGNOSIS — I482 Chronic atrial fibrillation: Secondary | ICD-10-CM | POA: Diagnosis not present

## 2014-11-23 DIAGNOSIS — Z87891 Personal history of nicotine dependence: Secondary | ICD-10-CM | POA: Diagnosis not present

## 2014-11-23 DIAGNOSIS — I716 Thoracoabdominal aortic aneurysm, without rupture: Secondary | ICD-10-CM | POA: Diagnosis present

## 2014-11-23 DIAGNOSIS — R0902 Hypoxemia: Secondary | ICD-10-CM | POA: Diagnosis present

## 2014-11-23 DIAGNOSIS — R410 Disorientation, unspecified: Secondary | ICD-10-CM | POA: Insufficient documentation

## 2014-11-23 DIAGNOSIS — Z90710 Acquired absence of both cervix and uterus: Secondary | ICD-10-CM

## 2014-11-23 DIAGNOSIS — R0602 Shortness of breath: Secondary | ICD-10-CM | POA: Diagnosis not present

## 2014-11-23 DIAGNOSIS — Z66 Do not resuscitate: Secondary | ICD-10-CM | POA: Diagnosis present

## 2014-11-23 DIAGNOSIS — D6959 Other secondary thrombocytopenia: Secondary | ICD-10-CM | POA: Diagnosis not present

## 2014-11-23 DIAGNOSIS — K219 Gastro-esophageal reflux disease without esophagitis: Secondary | ICD-10-CM | POA: Diagnosis present

## 2014-11-23 DIAGNOSIS — N2581 Secondary hyperparathyroidism of renal origin: Secondary | ICD-10-CM | POA: Diagnosis present

## 2014-11-23 DIAGNOSIS — I951 Orthostatic hypotension: Secondary | ICD-10-CM | POA: Diagnosis not present

## 2014-11-23 DIAGNOSIS — A047 Enterocolitis due to Clostridium difficile: Principal | ICD-10-CM | POA: Diagnosis present

## 2014-11-23 DIAGNOSIS — I714 Abdominal aortic aneurysm, without rupture, unspecified: Secondary | ICD-10-CM | POA: Diagnosis present

## 2014-11-23 DIAGNOSIS — Z8674 Personal history of sudden cardiac arrest: Secondary | ICD-10-CM

## 2014-11-23 DIAGNOSIS — R627 Adult failure to thrive: Secondary | ICD-10-CM | POA: Diagnosis present

## 2014-11-23 DIAGNOSIS — J449 Chronic obstructive pulmonary disease, unspecified: Secondary | ICD-10-CM | POA: Diagnosis not present

## 2014-11-23 DIAGNOSIS — I4891 Unspecified atrial fibrillation: Secondary | ICD-10-CM | POA: Diagnosis present

## 2014-11-23 DIAGNOSIS — Z992 Dependence on renal dialysis: Secondary | ICD-10-CM | POA: Diagnosis not present

## 2014-11-23 DIAGNOSIS — I509 Heart failure, unspecified: Secondary | ICD-10-CM | POA: Diagnosis present

## 2014-11-23 DIAGNOSIS — R5383 Other fatigue: Secondary | ICD-10-CM | POA: Diagnosis not present

## 2014-11-23 DIAGNOSIS — Z681 Body mass index (BMI) 19 or less, adult: Secondary | ICD-10-CM

## 2014-11-23 DIAGNOSIS — E860 Dehydration: Secondary | ICD-10-CM | POA: Diagnosis not present

## 2014-11-23 DIAGNOSIS — M6281 Muscle weakness (generalized): Secondary | ICD-10-CM | POA: Diagnosis not present

## 2014-11-23 DIAGNOSIS — R918 Other nonspecific abnormal finding of lung field: Secondary | ICD-10-CM | POA: Diagnosis not present

## 2014-11-23 DIAGNOSIS — R630 Anorexia: Secondary | ICD-10-CM | POA: Insufficient documentation

## 2014-11-23 DIAGNOSIS — N390 Urinary tract infection, site not specified: Secondary | ICD-10-CM | POA: Diagnosis not present

## 2014-11-23 DIAGNOSIS — Z515 Encounter for palliative care: Secondary | ICD-10-CM | POA: Insufficient documentation

## 2014-11-23 DIAGNOSIS — D696 Thrombocytopenia, unspecified: Secondary | ICD-10-CM | POA: Diagnosis not present

## 2014-11-23 DIAGNOSIS — E889 Metabolic disorder, unspecified: Secondary | ICD-10-CM | POA: Diagnosis present

## 2014-11-23 DIAGNOSIS — B9689 Other specified bacterial agents as the cause of diseases classified elsewhere: Secondary | ICD-10-CM | POA: Diagnosis not present

## 2014-11-23 DIAGNOSIS — R197 Diarrhea, unspecified: Secondary | ICD-10-CM

## 2014-11-23 DIAGNOSIS — R1084 Generalized abdominal pain: Secondary | ICD-10-CM | POA: Diagnosis not present

## 2014-11-23 DIAGNOSIS — R262 Difficulty in walking, not elsewhere classified: Secondary | ICD-10-CM | POA: Diagnosis not present

## 2014-11-23 DIAGNOSIS — A0472 Enterocolitis due to Clostridium difficile, not specified as recurrent: Secondary | ICD-10-CM | POA: Diagnosis present

## 2014-11-23 DIAGNOSIS — R531 Weakness: Secondary | ICD-10-CM | POA: Diagnosis not present

## 2014-11-23 DIAGNOSIS — E43 Unspecified severe protein-calorie malnutrition: Secondary | ICD-10-CM | POA: Diagnosis not present

## 2014-11-23 DIAGNOSIS — R64 Cachexia: Secondary | ICD-10-CM | POA: Diagnosis present

## 2014-11-23 DIAGNOSIS — R911 Solitary pulmonary nodule: Secondary | ICD-10-CM | POA: Diagnosis not present

## 2014-11-23 DIAGNOSIS — I10 Essential (primary) hypertension: Secondary | ICD-10-CM | POA: Diagnosis present

## 2014-11-23 DIAGNOSIS — I959 Hypotension, unspecified: Secondary | ICD-10-CM | POA: Diagnosis not present

## 2014-11-23 DIAGNOSIS — Z7982 Long term (current) use of aspirin: Secondary | ICD-10-CM

## 2014-11-23 DIAGNOSIS — N186 End stage renal disease: Secondary | ICD-10-CM

## 2014-11-23 LAB — URINALYSIS, ROUTINE W REFLEX MICROSCOPIC
Glucose, UA: NEGATIVE mg/dL
KETONES UR: 15 mg/dL — AB
Nitrite: POSITIVE — AB
Protein, ur: >300 mg/dL — AB
SPECIFIC GRAVITY, URINE: 1.017 (ref 1.005–1.030)
UROBILINOGEN UA: 0.2 mg/dL (ref 0.0–1.0)
pH: 6.5 (ref 5.0–8.0)

## 2014-11-23 LAB — URINE MICROSCOPIC-ADD ON

## 2014-11-23 LAB — I-STAT CHEM 8, ED
BUN: 46 mg/dL — AB (ref 6–23)
CREATININE: 6 mg/dL — AB (ref 0.50–1.10)
Calcium, Ion: 1.21 mmol/L (ref 1.13–1.30)
Chloride: 103 mmol/L (ref 96–112)
Glucose, Bld: 69 mg/dL — ABNORMAL LOW (ref 70–99)
HEMATOCRIT: 52 % — AB (ref 36.0–46.0)
Hemoglobin: 17.7 g/dL — ABNORMAL HIGH (ref 12.0–15.0)
POTASSIUM: 4.4 mmol/L (ref 3.5–5.1)
Sodium: 138 mmol/L (ref 135–145)
TCO2: 23 mmol/L (ref 0–100)

## 2014-11-23 LAB — CBC WITH DIFFERENTIAL/PLATELET
Basophils Absolute: 0 10*3/uL (ref 0.0–0.1)
Basophils Relative: 0 % (ref 0–1)
EOS ABS: 0 10*3/uL (ref 0.0–0.7)
EOS PCT: 0 % (ref 0–5)
HEMATOCRIT: 42.3 % (ref 36.0–46.0)
HEMOGLOBIN: 13.3 g/dL (ref 12.0–15.0)
LYMPHS ABS: 0.9 10*3/uL (ref 0.7–4.0)
Lymphocytes Relative: 9 % — ABNORMAL LOW (ref 12–46)
MCH: 29.8 pg (ref 26.0–34.0)
MCHC: 31.4 g/dL (ref 30.0–36.0)
MCV: 94.6 fL (ref 78.0–100.0)
MONOS PCT: 8 % (ref 3–12)
Monocytes Absolute: 0.9 10*3/uL (ref 0.1–1.0)
Neutro Abs: 8.8 10*3/uL — ABNORMAL HIGH (ref 1.7–7.7)
Neutrophils Relative %: 83 % — ABNORMAL HIGH (ref 43–77)
Platelets: 66 10*3/uL — ABNORMAL LOW (ref 150–400)
RBC: 4.47 MIL/uL (ref 3.87–5.11)
RDW: 17.6 % — ABNORMAL HIGH (ref 11.5–15.5)
WBC: 10.6 10*3/uL — ABNORMAL HIGH (ref 4.0–10.5)

## 2014-11-23 LAB — I-STAT TROPONIN, ED: Troponin i, poc: 0.06 ng/mL (ref 0.00–0.08)

## 2014-11-23 LAB — HEPATIC FUNCTION PANEL
ALK PHOS: 65 U/L (ref 39–117)
ALT: 16 U/L (ref 0–35)
AST: 27 U/L (ref 0–37)
Albumin: 2.5 g/dL — ABNORMAL LOW (ref 3.5–5.2)
BILIRUBIN DIRECT: 0.2 mg/dL (ref 0.0–0.5)
BILIRUBIN INDIRECT: 0.7 mg/dL (ref 0.3–0.9)
BILIRUBIN TOTAL: 0.9 mg/dL (ref 0.3–1.2)
TOTAL PROTEIN: 6.8 g/dL (ref 6.0–8.3)

## 2014-11-23 LAB — GLUCOSE, CAPILLARY: GLUCOSE-CAPILLARY: 84 mg/dL (ref 70–99)

## 2014-11-23 LAB — I-STAT CG4 LACTIC ACID, ED: Lactic Acid, Venous: 2.35 mmol/L (ref 0.5–2.0)

## 2014-11-23 LAB — CBG MONITORING, ED: Glucose-Capillary: 78 mg/dL (ref 70–99)

## 2014-11-23 MED ORDER — ALBUTEROL SULFATE (2.5 MG/3ML) 0.083% IN NEBU: 2.5000 mg | INHALATION_SOLUTION | Freq: Four times a day (QID) | RESPIRATORY_TRACT | Status: DC | PRN

## 2014-11-23 MED ORDER — MORPHINE SULFATE 2 MG/ML IJ SOLN: 1.0000 mg | INTRAMUSCULAR | Status: DC | PRN

## 2014-11-23 MED ORDER — DEXTROSE 5 % IV SOLN
1.0000 g | Freq: Once | INTRAVENOUS | Status: AC
  Administered 2014-11-23: 1 g via INTRAVENOUS
  Filled 2014-11-23: qty 10

## 2014-11-23 MED ORDER — ALLOPURINOL 100 MG PO TABS
100.0000 mg | ORAL_TABLET | Freq: Every day | ORAL | Status: DC
  Administered 2014-11-23 – 2014-11-30 (×8): 100 mg via ORAL
  Filled 2014-11-23 (×8): qty 1

## 2014-11-23 MED ORDER — RENA-VITE PO TABS
1.0000 | ORAL_TABLET | Freq: Every day | ORAL | Status: DC
  Administered 2014-11-23 – 2014-11-29 (×6): 1 via ORAL
  Filled 2014-11-23 (×9): qty 1

## 2014-11-23 MED ORDER — MIDODRINE HCL 5 MG PO TABS
10.0000 mg | ORAL_TABLET | Freq: Two times a day (BID) | ORAL | Status: DC
  Administered 2014-11-23 – 2014-11-30 (×13): 10 mg via ORAL
  Filled 2014-11-23 (×16): qty 2

## 2014-11-23 MED ORDER — CLOTRIMAZOLE 10 MG MT TROC
10.0000 mg | Freq: Every day | OROMUCOSAL | Status: DC
  Filled 2014-11-23 (×4): qty 1

## 2014-11-23 MED ORDER — AMIODARONE HCL 200 MG PO TABS
200.0000 mg | ORAL_TABLET | Freq: Every day | ORAL | Status: DC
  Administered 2014-11-23 – 2014-11-30 (×8): 200 mg via ORAL
  Filled 2014-11-23 (×8): qty 1

## 2014-11-23 MED ORDER — ACETAMINOPHEN 650 MG RE SUPP: 650.0000 mg | Freq: Four times a day (QID) | RECTAL | Status: DC | PRN

## 2014-11-23 MED ORDER — VANCOMYCIN 50 MG/ML ORAL SOLUTION
250.0000 mg | Freq: Four times a day (QID) | ORAL | Status: DC
  Administered 2014-11-23 – 2014-11-26 (×6): 250 mg via ORAL
  Filled 2014-11-23 (×15): qty 5

## 2014-11-23 MED ORDER — ARFORMOTEROL TARTRATE 15 MCG/2ML IN NEBU
15.0000 ug | INHALATION_SOLUTION | Freq: Two times a day (BID) | RESPIRATORY_TRACT | Status: DC
  Administered 2014-11-23 – 2014-11-29 (×11): 15 ug via RESPIRATORY_TRACT
  Filled 2014-11-23 (×17): qty 2

## 2014-11-23 MED ORDER — SODIUM CHLORIDE 0.9 % IV BOLUS (SEPSIS)
500.0000 mL | Freq: Once | INTRAVENOUS | Status: AC
  Administered 2014-11-23: 500 mL via INTRAVENOUS

## 2014-11-23 MED ORDER — SODIUM CHLORIDE 0.9 % IJ SOLN
3.0000 mL | Freq: Two times a day (BID) | INTRAMUSCULAR | Status: DC
  Administered 2014-11-24 – 2014-11-29 (×11): 3 mL via INTRAVENOUS

## 2014-11-23 MED ORDER — ONDANSETRON HCL 4 MG PO TABS: 4.0000 mg | ORAL_TABLET | Freq: Four times a day (QID) | ORAL | Status: DC | PRN

## 2014-11-23 MED ORDER — THEOPHYLLINE ER 200 MG PO TB12
200.0000 mg | ORAL_TABLET | Freq: Two times a day (BID) | ORAL | Status: DC
  Administered 2014-11-23 – 2014-11-30 (×12): 200 mg via ORAL
  Filled 2014-11-23 (×16): qty 1

## 2014-11-23 MED ORDER — METRONIDAZOLE IN NACL 5-0.79 MG/ML-% IV SOLN
500.0000 mg | Freq: Three times a day (TID) | INTRAVENOUS | Status: DC
  Filled 2014-11-23 (×3): qty 100

## 2014-11-23 MED ORDER — DEXTROSE 5 % IV SOLN
1.0000 g | INTRAVENOUS | Status: DC
  Administered 2014-11-24 – 2014-11-25 (×2): 1 g via INTRAVENOUS
  Filled 2014-11-23 (×3): qty 10

## 2014-11-23 MED ORDER — BOOST / RESOURCE BREEZE PO LIQD
1.0000 | Freq: Three times a day (TID) | ORAL | Status: DC
  Administered 2014-11-24 – 2014-11-27 (×5): 1 via ORAL
  Filled 2014-11-23: qty 1

## 2014-11-23 MED ORDER — IOHEXOL 350 MG/ML SOLN
80.0000 mL | Freq: Once | INTRAVENOUS | Status: AC | PRN
  Administered 2014-11-23: 80 mL via INTRAVENOUS

## 2014-11-23 MED ORDER — BUDESONIDE 0.5 MG/2ML IN SUSP
0.5000 mg | Freq: Two times a day (BID) | RESPIRATORY_TRACT | Status: DC
  Administered 2014-11-23 – 2014-11-29 (×11): 0.5 mg via RESPIRATORY_TRACT
  Filled 2014-11-23 (×16): qty 2

## 2014-11-23 MED ORDER — SODIUM CHLORIDE 0.9 % IJ SOLN: 3.0000 mL | INTRAMUSCULAR | Status: DC | PRN

## 2014-11-23 MED ORDER — SODIUM CHLORIDE 0.9 % IJ SOLN: 3.0000 mL | Freq: Two times a day (BID) | INTRAMUSCULAR | Status: DC

## 2014-11-23 MED ORDER — ACETAMINOPHEN 325 MG PO TABS: 650.0000 mg | ORAL_TABLET | Freq: Four times a day (QID) | ORAL | Status: DC | PRN

## 2014-11-23 MED ORDER — NYSTATIN 100000 UNIT/ML MT SUSP
5.0000 mL | Freq: Four times a day (QID) | OROMUCOSAL | Status: DC
  Administered 2014-11-23 – 2014-11-30 (×20): 500000 [IU] via ORAL
  Filled 2014-11-23 (×31): qty 5

## 2014-11-23 MED ORDER — ONDANSETRON HCL 4 MG/2ML IJ SOLN: 4.0000 mg | Freq: Four times a day (QID) | INTRAMUSCULAR | Status: DC | PRN

## 2014-11-23 MED ORDER — SODIUM CHLORIDE 0.9 % IV SOLN: 250.0000 mL | INTRAVENOUS | Status: DC | PRN

## 2014-11-23 MED ORDER — OXYCODONE HCL 5 MG PO TABS: 5.0000 mg | ORAL_TABLET | ORAL | Status: DC | PRN

## 2014-11-23 NOTE — ED Notes (Signed)
RN at bedside with ultrasound to find IV access.
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
## 2014-11-23 NOTE — Progress Notes (Signed)
New Admission Note:   Arrival Method: Stretcher  Mental Orientation: Lethargic, very limited response  Telemetry: box #20  Assessment: Completed Skin: stage 2 to sacrum  IV: intact and infusing  Pain: Moans when palpating abdomen  Tubes: O2 on 2L Safety Measures: Safety Fall Prevention Plan has been given, discussed and signed Admission: Completed [address] Orientation: Patient has been orientated to the room, unit and staff.  Family: At bedside   Orders have been reviewed and implemented. Will continue to monitor the patient. Call light has been placed within reach and bed alarm has been activated.   Riesa PopeJasmine Anja Neuzil BSN, RN Phone number: [phone]

## 2014-11-23 NOTE — Progress Notes (Signed)
Attempted to put in flexi seal x2, pt will not relax muscles and keeps moaning.

## 2014-11-23 NOTE — ED Notes (Signed)
PA Laveda Normanran at bedside with family (Granddaughter and daughter).

## 2014-11-23 NOTE — Consult Note (Signed)
Indication for Consultation:  Management of ESRD/hemodialysis; anemia, hypertension/volume and secondary hyperparathyroidism  HPI: [name] is a 81 y.o. female who presented to the ED this AM with lethargy and hypotension from HD, she receives HD MWF [place], did not have HD this AM. She reports she had been feeling weak/tired with poor appetite since discharge 11/01/14 when she was admitted for cdiff, she was also referred for pulmonology for RUL lung mass suspicious for malignancy. Diarrhea had improved but she started having loose stool last night. She has been having intermittent abdominal pain. She apparently had CT yesterday of chest and abd for reports of abd pain- [facility]. She reports she just doesn't feel well now. Will arrange for HD while in the hospital. Not eating well,prob swallowing,painful per family  Past Medical History  Diagnosis Date  . Hypertension   . Hyperlipidemia   . COPD (chronic obstructive pulmonary disease)   . Anemia   . ESRD (end stage renal disease) on dialysis   . CHF (congestive heart failure)   . GERD (gastroesophageal reflux disease)   . Constipation   . PEA (Pulseless electrical activity) 04/19/2012    PEA arrest complicated by hyperkalemia, PNA, anocic encephalopathy, VDRF   Past Surgical History  Procedure Laterality Date  . Abdominal hysterectomy    . Arteriovenous fistula  06/02/12    left radiocephalic - failed  . Arteriovenous graft placement  07/14/12    Created at [facility]   No family history on file. Social History:  reports that she quit smoking about 8 years ago. Her smoking use included Cigarettes. She does not have any smokeless tobacco history on file. She reports that she does not drink alcohol or use illicit drugs. No Known Allergies Prior to Admission medications   Medication Sig Start Date End Date Taking? Authorizing Provider  albuterol (PROVENTIL) (2.5 MG/3ML) 0.083% nebulizer solution Take 2.5 mg by nebulization  every 6 (six) hours as needed for wheezing or shortness of breath.    Yes Historical Provider, MD  allopurinol (ZYLOPRIM) 100 MG tablet Take 100 mg by mouth daily.  09/05/11  Yes Historical Provider, MD  amiodarone (PACERONE) 200 MG tablet Take 1 tablet (200 mg total) by mouth daily. 08/05/14  Yes Hollice EspySendil K Krishnan, MD  arformoterol (BROVANA) 15 MCG/2ML NEBU Take 15 mcg by nebulization 2 (two) times daily.   Yes Historical Provider, MD  aspirin 81 MG tablet Take 81 mg by mouth daily.    Yes Historical Provider, MD  budesonide (PULMICORT) 0.5 MG/2ML nebulizer solution Take 0.5 mg by nebulization 2 (two) times daily.   Yes Historical Provider, MD  midodrine (PROAMATINE) 10 MG tablet Take 1 tablet (10 mg total) by mouth 2 (two) times daily with a meal. 08/05/14  Yes Hollice EspySendil K Krishnan, MD  multivitamin (RENA-VIT) TABS tablet Take 1 tablet by mouth daily.   Yes Historical Provider, MD  omeprazole (PRILOSEC) 20 MG capsule Take 20 mg by mouth daily.   Yes Historical Provider, MD  PRESCRIPTION MEDICATION Take 60 mLs by mouth 3 (three) times daily. medpass   Yes Historical Provider, MD  senna (SENOKOT) 8.6 MG TABS Take 1 tablet by mouth daily.   Yes Historical Provider, MD  theophylline (THEODUR) 100 MG 12 hr tablet Take 200 mg by mouth 2 (two) times daily.    Yes Historical Provider, MD  vancomycin (VANCOCIN) 50 mg/mL oral solution Take 2.5 mLs (125 mg total) by mouth 4 (four) times daily. 11/01/14   Clydia LlanoMutaz Elmahi, MD  No current facility-administered medications for this encounter.   Current Outpatient Prescriptions  Medication Sig Dispense Refill  . albuterol (PROVENTIL) (2.5 MG/3ML) 0.083% nebulizer solution Take 2.5 mg by nebulization every 6 (six) hours as needed for wheezing or shortness of breath.     . allopurinol (ZYLOPRIM) 100 MG tablet Take 100 mg by mouth daily.     Marland Kitchen amiodarone (PACERONE) 200 MG tablet Take 1 tablet (200 mg total) by mouth daily. 30 tablet 1  . arformoterol (BROVANA) 15  MCG/2ML NEBU Take 15 mcg by nebulization 2 (two) times daily.    Marland Kitchen aspirin 81 MG tablet Take 81 mg by mouth daily.     . budesonide (PULMICORT) 0.5 MG/2ML nebulizer solution Take 0.5 mg by nebulization 2 (two) times daily.    . midodrine (PROAMATINE) 10 MG tablet Take 1 tablet (10 mg total) by mouth 2 (two) times daily with a meal. 60 tablet 1  . multivitamin (RENA-VIT) TABS tablet Take 1 tablet by mouth daily.    Marland Kitchen omeprazole (PRILOSEC) 20 MG capsule Take 20 mg by mouth daily.    Marland Kitchen PRESCRIPTION MEDICATION Take 60 mLs by mouth 3 (three) times daily. medpass    . senna (SENOKOT) 8.6 MG TABS Take 1 tablet by mouth daily.    . theophylline (THEODUR) 100 MG 12 hr tablet Take 200 mg by mouth 2 (two) times daily.     . vancomycin (VANCOCIN) 50 mg/mL oral solution Take 2.5 mLs (125 mg total) by mouth 4 (four) times daily.     Labs: Basic Metabolic Panel:  Recent Labs Lab 11/23/14 0923  NA 138  K 4.4  CL 103  GLUCOSE 69*  BUN 46*  CREATININE 6.00*   Liver Function Tests:  Recent Labs Lab 11/23/14 0905  AST 27  ALT 16  ALKPHOS 65  BILITOT 0.9  PROT 6.8  ALBUMIN 2.5*   No results for input(s): LIPASE, AMYLASE in the last 168 hours. No results for input(s): AMMONIA in the last 168 hours. CBC:  Recent Labs Lab 11/23/14 0905 11/23/14 0923  WBC 10.6*  --   NEUTROABS 8.8*  --   HGB 13.3 17.7*  HCT 42.3 52.0*  MCV 94.6  --   PLT 66*  --    Cardiac Enzymes: No results for input(s): CKTOTAL, CKMB, CKMBINDEX, TROPONINI in the last 168 hours. CBG:  Recent Labs Lab 11/23/14 0939  GLUCAP 78   Iron Studies: No results for input(s): IRON, TIBC, TRANSFERRIN, FERRITIN in the last 72 hours. Studies/Results: Dg Chest 2 View  11/23/2014   CLINICAL DATA:  Weakness and shortness of breath  EXAM: CHEST  2 VIEW  COMPARISON:  11/22/2014  FINDINGS: Cardiac shadow is within normal limits. The lungs are well aerated bilaterally. The known right middle lobe nodule adjacent to the hilum is  again seen somewhat obscured by the patient's dialysis catheter. Diffuse aortic calcifications are noted. Tortuosity and aneurysmal dilatation of the thoracic aorta is noted. No acute bony abnormality is seen.  IMPRESSION: No change from the previous day.   Electronically Signed   By: Alcide Clever M.D.   On: 11/23/2014 09:45   Ct Head Wo Contrast  11/23/2014   CLINICAL DATA:  Generalized body weakness. End-stage renal disease. Anemia. Subsequent encounter.  EXAM: CT HEAD WITHOUT CONTRAST  TECHNIQUE: Contiguous axial images were obtained from the base of the skull through the vertex without intravenous contrast.  COMPARISON:  CT head 07/27/2014.  FINDINGS: No evidence for acute infarction, hemorrhage, mass lesion, hydrocephalus,  or extra-axial fluid. Generalized atrophy. Hypoattenuation of white matter representing chronic microvascular ischemic change. Vascular calcification without signs of proximal vascular thrombosis. Calvarium intact. Clear sinuses and mastoids.  IMPRESSION: Chronic changes as described. Similar appearance compared with November 2015.   Electronically Signed   By: Davonna Belling M.D.   On: 11/23/2014 10:50   Review of Systems: Gen: Positive for weakness and fatigue. Poor appetite and weight loss. Denies any fever, chills, sweats HEENT: No visual complaints, No history of Retinopathy. Normal external appearance No Epistaxis or Sore throat. No sinusitis.   CV: Positive for occasional lightheaded- per family.Denies chest pain, angina, palpitations, syncope, orthopnea, PND, peripheral edema, and claudication. Resp: Denies dyspnea at rest, dyspnea with exercise, cough, sputum, wheezing, coughing up blood, and pleurisy. GI: Positive for abd pain and diarrhea, Denies blood in stool. Painful swallowing, mouth sore GU : Denies urinary burning, blood in urine, urinary frequency, urinary hesitancy, nocturnal urination, and urinary incontinence.  No renal calculi. Daughter reports possible UTI MS:  Positive for left shoulder pain Derm: Denies rash, itching, dry skin, hives, moles, warts, or unhealing ulcers.  Psych: Mild confusion per family- Denies depression, anxiety, memory loss, suicidal ideation, hallucinations, paranoia, Heme: Denies bruising, bleeding, and enlarged lymph nodes. Neuro: No headache.  No diplopia. No dysarthria.  No dysphasia.  No history of CVA.  No Seizures. No paresthesias.  No weakness. Endocrine No DM.  No Thyroid disease.  No Adrenal disease.  Physical Exam: Filed Vitals:   11/23/14 0845 11/23/14 0900 11/23/14 0950 11/23/14 1000  BP: 104/50 108/51 118/51 134/47  Pulse: 98 97 95   Temp:      TempSrc:      Resp: SpO2: 92% 89% 96%      General: Thin, appears uncomfortable confused Head: Normocephalic, atraumatic, sclera non-icteric, mucus membranes are moist, fundi with scarring c/w HTN.White film on tongue and pharynx Neck: Supple. JVD not elevated. PCL Lungs: Clear, dim bases, shallow bilaterally to auscultation without wheezes, rales, or rhonchi. Breathing is unlabored. Heart: Tachy, irregular Gr 2/6 SEM Abdomen: Soft,  Umbilical tenderness, non-distended with normoactive bowel sounds. Mild guarding. Tender, pulsating large 7-8 cm Upper abdm mass M-S:  Appears weak. R should pain with ROM Lower extremities: +1 LE edema trophic changes, distally, callus and early ulcers,.  Also over 1st MTP medially on R  .  No DP pulses.  bilat fem bruits.   Neuro: drowsy, easily arousable, Alert and oriented x2. Moves all extremities spontaneously. Psych:  Mostly answers yes and no but is appropriate Dialysis Access: R IJ cath - no perm access options per outpt notes   Dialysis Orders:  MWF @ [facility] 3:30   160   47.5kgs  3K/2.25ca  R IJ cath   3000u heparin  400/1.5 No meds  Assessment/Plan: 1.  lethargy- head CT no acute infarct Suspect metabolic, R/O infx 2. Diarrhea- recent Cdiff. Completed po vanc. ??role of ishemia 3. abd pain- abd CT pending. AAA  diagnosed in nov- r/o dissection 4. UTI-  Started rocephin doubt is source of prob 5.  ESRD -  MWF @ [facility], due for HD today 6.  Hypertension/volume  - 134/47- on midodrine, coming in under edw, losing body wt prob swallowing with thrush 7.  Anemia  - no ESA or Fe- watch CBC 8.  Metabolic bone disease -  No vit D. No binders- last pth 21 and phos 5 2/24 9.  Nutrition - poor appetite, renal vit- needs supplements 10. Lung  mass- found in nov 11. afib- amiodarone  12. Thrush 13. EOL issues.  Needs to be addressed.  High risk for acute and chronic event in near future  Jetty Duhamel, NP [facility] [phone] 11/23/2014, 11:33 AM I have seen and examined this patient and agree with the plan of care seen, eval, examined, discussed with family. .  Lela Murfin L 11/23/2014, 12:47 PM

## 2014-11-23 NOTE — Progress Notes (Signed)
Pt.s glasses were removed prior to her CT Head scan. After the scan, I tried to place her glasses back on her, but she didn't want to wear them. Emanual Dula, transport services gave glasses to her daughter   on return to ED.

## 2014-11-23 NOTE — H&P (Signed)
[facility] History and Physical                                                                                    [name], is a 81 y.o. female  MRN: [id number]   DOB - 1929-04-10  Admit Date - 11/23/2014  Outpatient Primary MD for the patient is HODGES,[name], MD  With History of -  Past Medical History  Diagnosis Date  . Hypertension   . Hyperlipidemia   . COPD (chronic obstructive pulmonary disease)   . Anemia   . ESRD (end stage renal disease) on dialysis   . CHF (congestive heart failure)   . GERD (gastroesophageal reflux disease)   . Constipation   . PEA (Pulseless electrical activity) 04/19/2012    PEA arrest complicated by hyperkalemia, PNA, anocic encephalopathy, VDRF      Past Surgical History  Procedure Laterality Date  . Abdominal hysterectomy    . Arteriovenous fistula  06/02/12    left radiocephalic - failed  . Arteriovenous graft placement  07/14/12    Created at [facility]    in for   Chief Complaint  Patient presents with  . Fatigue     HPI [name]  is a 81 y.o. female, with history of chronic kidney disease on dialysis on MWF, COPD, hypertension, age of fibrillation on amiodarone. She was recently discharged on 2/9 after being treated for C. difficile colitis. She was discharged on oral vancomycin to continue for 10 more days. Patient also has a known right lung mass initially seen on CT on 08/02/2014 and recommendations were for the patient to follow-up a pulmonary medicine as an outpatient. According to the family the patient has undergone a ? PET scan recently but these results have not been given to the family.  She returns to the ER today for complaints related to lethargy and hypotension noted during hemodialysis in [place]. Apparently this patient had been feeling weak and tired with poor appetite since she was discharged but had not had any further diarrhea until yesterday evening. She been having intermittent abdominal pain primarily  in the lower abdomen. Upon presentation to the ER patient was afebrile, she was mildly tachypnea with a respiratory rate of 27 and she was hypoxic with O2 saturation of 89% therefore 2 L nasal cannula oxygen was applied. Her blood pressure was 108/51 with a heart rate of 98. Given her complaints of abdominal pain and known abdominal aortic aneurysm a CT angios abdomen and pelvis was completed. No aortic dissection was seen, she also underwent a CT of the chest that showed stable 2.1 cm spiculated nodule in the right lower lobe. Her troponin was normal at 0.06. Her lactic acid was elevated at 2.35. Her hemoglobin was elevated at 17.7. She had mild leukocytosis white count 10,600. He was also found have a new thrombocytopenia platelets 66,000. Her urinalysis was abnormal and concerning for urinary tract infection and also was consistent with volume depletion. Urine and blood cultures were obtained in the ER and patient was started empirically on Rocephin for presumed urinary tract infection.  Review of Systems   In addition to the HPI above,  No Fever-chills,  myalgias or other constitutional symptoms but she has expressed generalized malaise since being discharged home No Headache, changes with Vision or hearing, new weakness, tingling, numbness in any extremity, She is endorsing painful swallowing involving both pain in the mouth and in the throat when actually swallowing No Chest pain, Cough or Shortness of Breath, palpitations, orthopnea or DOE She has had crampy lower suprapubic abdominal pain which has now become more diffuse with the development of diarrhea in the past 24 hours, no blood has been seen in the diarrhea. No nausea or vomiting. No reported dysuria, hematuria or flank pain No new skin rashes, lesions, masses or bruises, No new joints pains-aches No recent weight gain or loss No polyuria, polydypsia or polyphagia,  *A full 10 point Review of Systems was done, except as stated above, all  other Review of Systems were negative.  Social History History  Substance Use Topics  . Smoking status: Former Smoker    Types: Cigarettes    Quit date: 10/07/2006  . Smokeless tobacco: Not on file  . Alcohol Use: No    Family History Patient unable to recall if any close members of her family had significant chronic medical problems  Prior to Admission medications   Medication Sig Start Date End Date Taking? Authorizing Provider  albuterol (PROVENTIL) (2.5 MG/3ML) 0.083% nebulizer solution Take 2.5 mg by nebulization every 6 (six) hours as needed for wheezing or shortness of breath.    Yes Historical Provider, MD  allopurinol (ZYLOPRIM) 100 MG tablet Take 100 mg by mouth daily.  09/05/11  Yes Historical Provider, MD  amiodarone (PACERONE) 200 MG tablet Take 1 tablet (200 mg total) by mouth daily. 08/05/14  Yes Hollice Espy, MD  arformoterol (BROVANA) 15 MCG/2ML NEBU Take 15 mcg by nebulization 2 (two) times daily.   Yes Historical Provider, MD  aspirin 81 MG tablet Take 81 mg by mouth daily.    Yes Historical Provider, MD  budesonide (PULMICORT) 0.5 MG/2ML nebulizer solution Take 0.5 mg by nebulization 2 (two) times daily.   Yes Historical Provider, MD  midodrine (PROAMATINE) 10 MG tablet Take 1 tablet (10 mg total) by mouth 2 (two) times daily with a meal. 08/05/14  Yes Hollice Espy, MD  multivitamin (RENA-VIT) TABS tablet Take 1 tablet by mouth daily.   Yes Historical Provider, MD  omeprazole (PRILOSEC) 20 MG capsule Take 20 mg by mouth daily.   Yes Historical Provider, MD  PRESCRIPTION MEDICATION Take 60 mLs by mouth 3 (three) times daily. medpass   Yes Historical Provider, MD  senna (SENOKOT) 8.6 MG TABS Take 1 tablet by mouth daily.   Yes Historical Provider, MD  theophylline (THEODUR) 100 MG 12 hr tablet Take 200 mg by mouth 2 (two) times daily.    Yes Historical Provider, MD  vancomycin (VANCOCIN) 50 mg/mL oral solution Take 2.5 mLs (125 mg total) by mouth 4 (four)  times daily. 11/01/14   Clydia Llano, MD    No Known Allergies  Physical Exam  Vitals  Blood pressure 106/50, pulse 93, temperature 98.7 F (37.1 C), temperature source Rectal, resp. rate 25, SpO2 100 %.   General:  In no acute distress, appears acutely ill and in mild pain  Psych: Awakens and attempts to answer questions although is somewhat confused, not verbalizing depression or anxiety  Neuro:   No focal neurological deficits, CN II through XII intact, Strength 4/5 all 4 extremities, Sensation intact all 4 extremities.  ENT:  Ears and Eyes appear Normal, Conjunctivae  clear, PERL. Moist oral mucosa without erythema or exudates.  Neck:  Supple, No lymphadenopathy appreciated  Respiratory:  Symmetrical chest wall movement, Good air movement bilaterally, coarse to auscultation without definitive wheezes or rhonchi. 2 L with saturation 100%  Cardiac:  RRR, No Murmurs, no LE edema noted, no JVD, No carotid bruits, peripheral pulses palpable at 2+  Abdomen:  Positive bowel sounds, Soft, diffusely tender without guarding or rebounding, Non distended,  No masses appreciated, no obvious hepatosplenomegaly; just incontinent of watery diarrheal stool  Skin:  No Cyanosis, Normal Skin Turgor, No Skin Rash or Bruise.  Extremities: Symmetrical without obvious trauma or injury,  no effusions.  Data Review  CBC  Recent Labs Lab 11/23/14 0905 11/23/14 0923  WBC 10.6*  --   HGB 13.3 17.7*  HCT 42.3 52.0*  PLT 66*  --   MCV 94.6  --   MCH 29.8  --   MCHC 31.4  --   RDW 17.6*  --   LYMPHSABS 0.9  --   MONOABS 0.9  --   EOSABS 0.0  --   BASOSABS 0.0  --     Chemistries   Recent Labs Lab 11/23/14 0905 11/23/14 0923  NA  --  138  K  --  4.4  CL  --  103  GLUCOSE  --  69*  BUN  --  46*  CREATININE  --  6.00*  AST 27  --   ALT 16  --   ALKPHOS 65  --   BILITOT 0.9  --     CrCl cannot be calculated (Unknown ideal weight.).  No results for input(s): TSH, T4TOTAL,  T3FREE, THYROIDAB in the last 72 hours.  Invalid input(s): FREET3  Coagulation profile No results for input(s): INR, PROTIME in the last 168 hours.  No results for input(s): DDIMER in the last 72 hours.  Cardiac Enzymes No results for input(s): CKMB, TROPONINI, MYOGLOBIN in the last 168 hours.  Invalid input(s): CK  Invalid input(s): POCBNP  Urinalysis    Component Value Date/Time   COLORURINE AMBER* 11/23/2014 0945   APPEARANCEUR TURBID* 11/23/2014 0945   LABSPEC 1.017 11/23/2014 0945   PHURINE 6.5 11/23/2014 0945   GLUCOSEU NEGATIVE 11/23/2014 0945   HGBUR LARGE* 11/23/2014 0945   BILIRUBINUR SMALL* 11/23/2014 0945   KETONESUR 15* 11/23/2014 0945   PROTEINUR >300* 11/23/2014 0945   UROBILINOGEN 0.2 11/23/2014 0945   NITRITE POSITIVE* 11/23/2014 0945   LEUKOCYTESUR LARGE* 11/23/2014 0945    Imaging results:   Ct Abdomen Pelvis Wo Contrast  10/28/2014   CLINICAL DATA:  Diarrhea for 3 weeks.  Rule out colitis.  EXAM: CT ABDOMEN AND PELVIS WITHOUT CONTRAST  TECHNIQUE: Multidetector CT imaging of the abdomen and pelvis was performed following the standard protocol without IV contrast.  COMPARISON:  CT 10/02/2014  FINDINGS: Chronic change at the lung bases. The heart is enlarged. There is a significant tortuosity of the distal thoracic aorta that appears similar to prior exam. No pleural effusion.  There is colonic wall thickening extending from the mid descending colon through the rectosigmoid colon. Associated pericolonic inflammatory change. Findings are consistent with colitis. No pneumatosis or perforation. Multiple descending and sigmoid colonic diverticula are again seen without peri-diverticular inflammatory change to suggest diverticulitis. There is no small bowel dilatation. The stomach is physiologically distended.  Layering gallstones within physiologically distended gallbladder. The unenhanced liver is unremarkable. The unenhanced spleen, pancreas, and adrenal glands are  normal. There is renal atrophy without hydronephrosis. The previous right  renal cyst is not well seen without contrast.  Large infrarenal abdominal aortic aneurysm appear similar to prior exam. This measures 7.2 x 6.2 cm in biaxial dimension, however differences in size are likely related to differences in technique. There is no periaortic soft tissue stranding to suggest rupture. Dense atherosclerosis throughout the abdominal aorta and its branches again seen. No free air, free fluid, or intra-abdominal fluid collection.  Within the pelvis the urinary bladder is decompressed not well evaluated. The uterus not well seen, atrophic versus surgically absent. There is no adnexal mass.  Anterolisthesis of L4 on L5 and L5 on S1 with associated degenerative change, stable. Stable degenerative change of the pubic symphysis. No acute osseous abnormalities.  IMPRESSION: 1. Colitis involving the descending and sigmoid colon. Findings suggest an infectious or inflammatory etiology. There is diverticulosis throughout the colon without peri-diverticular inflammatory change to suggest diverticulitis. 2. Stable chronic findings include significant infrarenal abdominal aortic aneurysm. Current maximal dimension of 7.2 cm is minimally increased compared to prior where it measured 6.9 cm, however this is likely related to differences in technique. 3. Cholelithiasis.   Electronically Signed   By: Rubye Oaks M.D.   On: 10/28/2014 05:59   Dg Chest 2 View  11/23/2014   CLINICAL DATA:  Weakness and shortness of breath  EXAM: CHEST  2 VIEW  COMPARISON:  11/22/2014  FINDINGS: Cardiac shadow is within normal limits. The lungs are well aerated bilaterally. The known right middle lobe nodule adjacent to the hilum is again seen somewhat obscured by the patient's dialysis catheter. Diffuse aortic calcifications are noted. Tortuosity and aneurysmal dilatation of the thoracic aorta is noted. No acute bony abnormality is seen.  IMPRESSION:  No change from the previous day.   Electronically Signed   By: Alcide Clever M.D.   On: 11/23/2014 09:45   Ct Head Wo Contrast  11/23/2014   CLINICAL DATA:  Generalized body weakness. End-stage renal disease. Anemia. Subsequent encounter.  EXAM: CT HEAD WITHOUT CONTRAST  TECHNIQUE: Contiguous axial images were obtained from the base of the skull through the vertex without intravenous contrast.  COMPARISON:  CT head 07/27/2014.  FINDINGS: No evidence for acute infarction, hemorrhage, mass lesion, hydrocephalus, or extra-axial fluid. Generalized atrophy. Hypoattenuation of white matter representing chronic microvascular ischemic change. Vascular calcification without signs of proximal vascular thrombosis. Calvarium intact. Clear sinuses and mastoids.  IMPRESSION: Chronic changes as described. Similar appearance compared with November 2015.   Electronically Signed   By: Davonna Belling M.D.   On: 11/23/2014 10:50   Ir Fluoro Guide Cv Line Right  10/31/2014   CLINICAL DATA:  Poorly functioning tunneled right jugular dialysis catheter.  EXAM: EXCHANGE OF TUNNELED CENTRAL VENOUS HEMODIALYSIS CATHETER UNDER FLUOROSCOPIC GUIDANCE  ANESTHESIA/SEDATION: Local anesthesia with 1% lidocaine.  MEDICATIONS: No additional medications.  FLUOROSCOPY TIME:  1 min and 16 seconds.  PROCEDURE: The procedure, risks, benefits, and alternatives were explained to the patient. Questions regarding the procedure were encouraged and answered. The patient understands and consents to the procedure.  The preexisting dialysis catheter and surrounding skin were prepped with chlorhexidine in a sterile fashion, and a sterile drape was applied covering the operative field. Maximum barrier sterile technique with sterile gowns and gloves were used for the procedure. Local anesthesia was provided with 1% lidocaine. A time-out was performed prior to the procedure.  The preexisting dialysis catheter was removed over a guidewire after freeing the subcutaneous  cuff using blunt dissection.  A new Bard Hemosplit tunneled hemodialysis catheter measuring  19 cm from tip to cuff was chosen for placement. The venotomy was dilated with 14 [place] and 16 [place] dilators prior to advancing the new catheter. The tunneled catheter was advanced over the guidewire under fluoroscopy.  Final catheter positioning was confirmed and documented with a fluoroscopic spot image. The catheter was aspirated, flushed with saline, and injected with appropriate volume heparin dwells.  COMPLICATIONS: None.  No pneumothorax.  FINDINGS: After removal of the pre-existing catheter, it was difficult to advance a new tunneled catheter through the venotomy, likely related to vein stenosis and fibrin material. The venotomy had to be dilated up to 16 [place] in order to allow advancement of a new tunneled catheter. After catheter placement, the tips lie in the right atrium. The catheter aspirates normally and is ready for immediate use.  IMPRESSION: Exchange of tunneled hemodialysis catheter via the right internal jugular vein. The catheter tips lie in the right atrium. The catheter is ready for immediate use.   Electronically Signed   By: Irish Lack M.D.   On: 10/31/2014 17:25   Ct Angio Chest Aorta W/cm &/or Wo/cm  11/23/2014   CLINICAL DATA:  Low blood pressure, lethargic. Mid abdominal pain. AAA.  EXAM: CT ANGIOGRAPHY CHEST, ABDOMEN AND PELVIS  TECHNIQUE: Multidetector CT imaging through the chest, abdomen and pelvis was performed using the standard protocol during bolus administration of intravenous contrast. Multiplanar reconstructed images and MIPs were obtained and reviewed to evaluate the vascular anatomy.  CONTRAST:  80mL OMNIPAQUE IOHEXOL 350 MG/ML SOLN  COMPARISON:  Noncontrast chest CT 11/22/2014. Abdominal and pelvic CT 10/28/2014.  FINDINGS: CTA CHEST FINDINGS  There is diffuse arterial megaly. Diffuse atherosclerotic calcifications throughout the aorta. Multi vessel Coronary artery  calcifications, most pronounced in the left anterior descending and circumflex arteries.  The descending thoracic aorta is markedly tortuous. The distal descending thoracic aorta in the left lower chest measures 4.2 cm. The aorta swings to the right of midline where the aorta measures 4.6 cm. This is not significantly changed. There is extensive mural plaque within this tortuous segment of distal descending thoracic aorta. No aneurysm.  2.1 cm spiculated nodule noted in the right lower lobe. When compared to study from 08/02/2014, this has not significantly changed in greatest diameter.  Scattered emphysematous changes in the lungs.  No pleural effusions.  Review of the MIP images confirms the above findings.  CTA ABDOMEN AND PELVIS FINDINGS  Complex abdominal aortic aneurysm. Diffuse peripheral calcifications throughout the aorta. Maximum aortic diameter measures 7.2 cm. Extensive mural plaque throughout the aneurysm. The aneurysm ends at the aortic bifurcation. Iliac vessels are calcified, non aneurysmal. Maximum aortic diameter is stable when compared to recent abdominal CT has enlarged since 10/08/2011 when the aneurysm measured approximately 6.3 cm in greatest diameter. No dissection.  Layering high density material within the gallbladder, likely gallstones. Liver, spleen, pancreas, right adrenal gland unremarkable. Mild diffuse fullness of the left adrenal gland, likely hyperplasia. No hydronephrosis or focal abnormality within the kidneys.  Sigmoid diverticulosis. No evidence of active diverticulitis. Stomach and small bowel are decompressed. No free fluid, free air or adenopathy.  No acute bony abnormality or focal bone lesion. Degenerative changes in the lumbar spine.  Review of the MIP images confirms the above findings.  IMPRESSION: Complex thoracoabdominal aortic aneurysm with marked tortuosity in the lower chest. Extensive mural plaque in the lower chest and abdominal component. No evidence of  significant change since most recent studies. No evidence of dissection.  2.1 cm spiculated nodule in  the right lower lobe, not significantly changed in overall size since 08/02/2014.  Cardiomegaly, coronary artery disease.  Cholelithiasis.  No acute findings in the chest, abdomen or pelvis.   Electronically Signed   By: Charlett NoseKevin  Dover M.D.   On: 11/23/2014 12:36   Ct Angio Abd/pel W/ And/or W/o  11/23/2014   CLINICAL DATA:  Low blood pressure, lethargic. Mid abdominal pain. AAA.  EXAM: CT ANGIOGRAPHY CHEST, ABDOMEN AND PELVIS  TECHNIQUE: Multidetector CT imaging through the chest, abdomen and pelvis was performed using the standard protocol during bolus administration of intravenous contrast. Multiplanar reconstructed images and MIPs were obtained and reviewed to evaluate the vascular anatomy.  CONTRAST:  80mL OMNIPAQUE IOHEXOL 350 MG/ML SOLN  COMPARISON:  Noncontrast chest CT 11/22/2014. Abdominal and pelvic CT 10/28/2014.  FINDINGS: CTA CHEST FINDINGS  There is diffuse arterial megaly. Diffuse atherosclerotic calcifications throughout the aorta. Multi vessel Coronary artery calcifications, most pronounced in the left anterior descending and circumflex arteries.  The descending thoracic aorta is markedly tortuous. The distal descending thoracic aorta in the left lower chest measures 4.2 cm. The aorta swings to the right of midline where the aorta measures 4.6 cm. This is not significantly changed. There is extensive mural plaque within this tortuous segment of distal descending thoracic aorta. No aneurysm.  2.1 cm spiculated nodule noted in the right lower lobe. When compared to study from 08/02/2014, this has not significantly changed in greatest diameter.  Scattered emphysematous changes in the lungs.  No pleural effusions.  Review of the MIP images confirms the above findings.  CTA ABDOMEN AND PELVIS FINDINGS  Complex abdominal aortic aneurysm. Diffuse peripheral calcifications throughout the aorta. Maximum  aortic diameter measures 7.2 cm. Extensive mural plaque throughout the aneurysm. The aneurysm ends at the aortic bifurcation. Iliac vessels are calcified, non aneurysmal. Maximum aortic diameter is stable when compared to recent abdominal CT has enlarged since 10/08/2011 when the aneurysm measured approximately 6.3 cm in greatest diameter. No dissection.  Layering high density material within the gallbladder, likely gallstones. Liver, spleen, pancreas, right adrenal gland unremarkable. Mild diffuse fullness of the left adrenal gland, likely hyperplasia. No hydronephrosis or focal abnormality within the kidneys.  Sigmoid diverticulosis. No evidence of active diverticulitis. Stomach and small bowel are decompressed. No free fluid, free air or adenopathy.  No acute bony abnormality or focal bone lesion. Degenerative changes in the lumbar spine.  Review of the MIP images confirms the above findings.  IMPRESSION: Complex thoracoabdominal aortic aneurysm with marked tortuosity in the lower chest. Extensive mural plaque in the lower chest and abdominal component. No evidence of significant change since most recent studies. No evidence of dissection.  2.1 cm spiculated nodule in the right lower lobe, not significantly changed in overall size since 08/02/2014.  Cardiomegaly, coronary artery disease.  Cholelithiasis.  No acute findings in the chest, abdomen or pelvis.   Electronically Signed   By: Charlett NoseKevin  Dover M.D.   On: 11/23/2014 12:36     EKG: Sinus tachycardia, QTC 501 ms   Assessment & Plan  Active Problems:   Diarrhea/recent C. difficile colitis -Admit to medical floor -Repeat C. difficile PCR -Resume oral vancomycin and IV Flagyl; patient will need concurrent treatment since requiring antibiotics for presumed UTI (see below) -Frequent watery stools so place Flexiseal -Seems volume depleted from recent diarrhea; this is also supported by elevated lactic acid level and since dialysis patient will provide  periodic saline boluses -If C. difficile PCR is positive then patient may have recalcitrant colitis  and may require infectious disease service consultation -No mention of colitis on CT scan report    UTI  -Urinalysis abnormal and certainly concerning for urinary tract infection given presentation -Follow up on urine culture and blood cultures -Begin empiric antibiotics with Rocephin -See above regarding C. difficile treatment -Abnormal urinalysis also could be reflective of poor volume status    Thrombocytopenia -Acute problem and possibly suggestive of low perfusion and possibly gram-negative infection -Platelets less than 100,000 so unable to use pharmacological DVT prophylaxis -Repeat CBC in a.m. -Currently no signs of active bleeding   Odynophagia/suspected oral Candida -Begin nystatin swish and swallow -With slightly elevated QTC would not utilize IV Diflucan    Essential hypertension -Blood pressure soft and in review of medication administration record from home patient takes midodrine; continue midodrine -Was not on antihypertensive medications prior to admission    ESRD on dialysis -Unable to complete dialysis today -Nephrology has seen the patient in consultation today    Chronic A-fib -Currently maintaining sinus rhythm/sinus tachycardia -Continue amiodarone; slight elevation in QTC but in review of EKGs from the past 6 months these are stable findings -CHADVASc = 5 -mildly prolonged Qtc so repeat EKG in am while on Flagyl    AAA  -Stable on CT angiogram today    COPD  -No evidence of wheezing on exam so appears compensated -Continue home nebulizers    Spiculated right Lung mass -Has not increased in size since last imaging November 2015 -Need to confirm prior to discharge that patient has outpatient appointment set up with pulmonary medicine -It was reported patient had a PET scan prior to admission and is awaiting results but also need to confirm     Protein-calorie malnutrition, severe -Add resource beverage while having diarrhea    DVT Prophylaxis: SCDs  Family Communication:   Family at bedside in the ER  Code Status:  Full code  Condition:  Stable  Time spent in minutes : 60   Rogene Meth L. ANP on 11/23/2014 at 3:45 PM  Between 7am to 7pm - Pager - [phone]  After 7pm go to [email or web address] - password [email or web address]  And look for the night coverage person covering me after hours  [facility]

## 2014-11-23 NOTE — ED Notes (Signed)
Patient transported to CT 

## 2014-11-23 NOTE — ED Provider Notes (Signed)
CSN: [id number]     Arrival date & time 11/23/14  0820 History   First MD Initiated Contact with Patient 11/23/14 [phone]     No chief complaint on file.    (Consider location/radiation/quality/duration/timing/severity/associated sxs/prior Treatment) HPI   81 year old female with history of end-stage renal disease currently on Monday Wednesday Friday dialysis with history of hypertension hyperlipidemia and CHF presenting for evaluation of generalized weakness and confusion.  Level V caveats applies as pt cannot give any reliable history.  EMS brought patient from [facility] for evaluation of lethargy and hypotensive. Staff noticed that her initial blood pressure was 67/50 then 72 which  increased to 108/53 without specific treatment.  Pt was scheduled to go to her scheduled dialysis.  Family member has arrived and history obtained through family member. Per daughter, patient was diagnosed with clostridium difference so infection approximately a month ago and was hospitalized for 4 days. She was sent to a rehabilitation facility in which she has been staying there for about 3 weeks. Family member noticed for the past 2 weeks she has decrease in appetite and has not been eating appropriately. Last night she developed several bouts of loose stools that appears to be similar to her prior C. difficile infection. However she was able to communicate and mentating appropriately. She is more confused today according to family members. She also was found to have several lesions in her lung this past November concerning for cancer and had a PET scan performed yesterday, results has not been informed yet. She was also found to have an aneurysm, unsure of the size according to the daughter. Her only complaint at this time is abdominal pain.  Past Medical History  Diagnosis Date  . Hypertension   . Hyperlipidemia   . COPD (chronic obstructive pulmonary disease)   . Anemia   . ESRD (end stage  renal disease) on dialysis   . CHF (congestive heart failure)   . GERD (gastroesophageal reflux disease)   . Constipation   . PEA (Pulseless electrical activity) 04/19/2012    PEA arrest complicated by hyperkalemia, PNA, anocic encephalopathy, VDRF   Past Surgical History  Procedure Laterality Date  . Abdominal hysterectomy    . Arteriovenous fistula  06/02/12    left radiocephalic - failed  . Arteriovenous graft placement  07/14/12    Created at [facility]   No family history on file. History  Substance Use Topics  . Smoking status: Former Smoker    Types: Cigarettes    Quit date: 10/07/2006  . Smokeless tobacco: Not on file  . Alcohol Use: No   OB History    No data available     Review of Systems  Unable to perform ROS: Mental status change      Allergies  Review of patient's allergies indicates no known allergies.  Home Medications   Prior to Admission medications   Medication Sig Start Date End Date Taking? Authorizing Provider  albuterol (PROVENTIL) (2.5 MG/3ML) 0.083% nebulizer solution Take 2.5 mg by nebulization every 6 (six) hours as needed for wheezing or shortness of breath.     Historical Provider, MD  allopurinol (ZYLOPRIM) 100 MG tablet Take 100 mg by mouth daily.  09/05/11   Historical Provider, MD  amiodarone (PACERONE) 200 MG tablet Take 1 tablet (200 mg total) by mouth daily. 08/05/14   Hollice EspySendil K Krishnan, MD  arformoterol (BROVANA) 15 MCG/2ML NEBU Take 15 mcg by nebulization 2 (two) times daily.    Historical Provider, MD  aspirin 81 MG tablet Take 81 mg by mouth daily.     Historical Provider, MD  budesonide (PULMICORT) 0.5 MG/2ML nebulizer solution Take 0.5 mg by nebulization 2 (two) times daily.    Historical Provider, MD  midodrine (PROAMATINE) 10 MG tablet Take 1 tablet (10 mg total) by mouth 2 (two) times daily with a meal. 08/05/14   Hollice Espy, MD  multivitamin (RENA-VIT) TABS tablet Take 1 tablet by mouth daily.    Historical Provider, MD   senna (SENOKOT) 8.6 MG TABS Take 1 tablet by mouth daily.    Historical Provider, MD  theophylline (THEODUR) 100 MG 12 hr tablet Take 100 mg by mouth 2 (two) times daily.    Historical Provider, MD  vancomycin (VANCOCIN) 50 mg/mL oral solution Take 2.5 mLs (125 mg total) by mouth 4 (four) times daily. 11/01/14   Clydia Llano, MD   SpO2 96% Physical Exam  Constitutional:  [name] African-American female, laying in bed, appears to be in no acute discomfort, nontoxic in appearance.  HENT:  Head: Normocephalic and atraumatic.  Thrush noted on tongue  Eyes: Conjunctivae are normal. Pupils are equal, round, and reactive to light.  Neck: Neck supple. No JVD present.  Cardiovascular:  Mild tachycardia without murmurs rubs or gallops  Pulmonary/Chest:  Poor effort but no wheezes rales or rhonchi heard  Triple port access to right upper chest, does not appears infected.  Abdominal: Soft. She exhibits no distension. There is tenderness (diffuse generalized tenderness to palpation without rebound tenderness, no abdominal bruit noted.).  Genitourinary:  Chaperone present: Small skin tear to buttocks without obvious pressure ulcer. Normal rectal tone, normal colored stool, no blood, no mass  Musculoskeletal:  Able to move all 4 extremities with poor effort, normal grip strength bilaterally. Intact distal pulses throughout.  Neurological:  Patient is alert and able to response to simple command but is not oriented. She is protecting her airway.  Skin: No rash noted.  Nonfunctional AV fistula to left upper arm without bruit.  Nursing note and vitals reviewed.   ED Course  Procedures (including critical care time)  Dialysis patient with previous history of C. difficile here with generalized fatigue, confusion, and diarrhea which may suggest reinfection of C. difficile. She is currently hemodynamically stable and afebrile. She does have diffuse abdominal tenderness and a history of AAA. She  is a dialysis patient that missed her dialysis today. She is confused, last seen normal was yesterday afternoon. Workup initiated, care discussed with Dr. Deretha Emory.  Pt is a full code.    10:28 AM Patient is to make urine. Urine shows signs of ear tract infection. Elevated WBC at 10.6. Elevated lactic acid of 2.35. Giving her mental confusion concerning for possible infection and her abdominal pain, will treat for suspect UTI with Rocephin. Since family member mention that patient has been diagnosed with a AAA in November and now she has abdominal pain, will obtain chest abdomen and pelvis CT scan for further assessments of her AAA and to rule out dissection. She is a dialysis patient.  2:40 PM Blood culture and urine culture sent. CT scan of the chest abdomen pelvis show evidence of AAA but no evidence of dissection. No other acute pathology noted on CT scan. Head CT and chest x-ray without acute finding. Although  UTI could be the source of infection, her port access could also be another source. I have spoken with nephrologist, Dr. Darrick Penna who has seen and evaluated the patient and agrees that she  would need medical admission, she will receive dialysis when she is stable. Plan to consult medicine for admission. Suspect source of infection (UTI, infected port, C.dif, etc...) causing delirium. Evidence of yeast infection in urine, and thrush in mouth, clotrimazole troches ordered by Dr. Darrick Penna.  2:51 PM I have consulted with [facility] who agrees to see and admit pt for further care.     Labs Review Labs Reviewed  CBC WITH DIFFERENTIAL/PLATELET - Abnormal; Notable for the following:    WBC 10.6 (*)    RDW 17.6 (*)    Platelets 66 (*)    Neutrophils Relative % 83 (*)    Neutro Abs 8.8 (*)    Lymphocytes Relative 9 (*)    All other components within normal limits  URINALYSIS, ROUTINE W REFLEX MICROSCOPIC - Abnormal; Notable for the following:    Color, Urine AMBER (*)     APPearance TURBID (*)    Hgb urine dipstick LARGE (*)    Bilirubin Urine SMALL (*)    Ketones, ur 15 (*)    Protein, ur >300 (*)    Nitrite POSITIVE (*)    Leukocytes, UA LARGE (*)    All other components within normal limits  HEPATIC FUNCTION PANEL - Abnormal; Notable for the following:    Albumin 2.5 (*)    All other components within normal limits  URINE MICROSCOPIC-ADD ON - Abnormal; Notable for the following:    Squamous Epithelial / LPF MANY (*)    Bacteria, UA MANY (*)    All other components within normal limits  I-STAT CHEM 8, ED - Abnormal; Notable for the following:    BUN 46 (*)    Creatinine, Ser 6.00 (*)    Glucose, Bld 69 (*)    Hemoglobin 17.7 (*)    HCT 52.0 (*)    All other components within normal limits  I-STAT CG4 LACTIC ACID, ED - Abnormal; Notable for the following:    Lactic Acid, Venous 2.35 (*)    All other components within normal limits  CULTURE, BLOOD (ROUTINE X 2)  CULTURE, BLOOD (ROUTINE X 2)  URINE CULTURE  CBG MONITORING, ED  I-STAT TROPOININ, ED    Imaging Review Dg Chest 2 View  11/23/2014   CLINICAL DATA:  Weakness and shortness of breath  EXAM: CHEST  2 VIEW  COMPARISON:  11/22/2014  FINDINGS: Cardiac shadow is within normal limits. The lungs are well aerated bilaterally. The known right middle lobe nodule adjacent to the hilum is again seen somewhat obscured by the patient's dialysis catheter. Diffuse aortic calcifications are noted. Tortuosity and aneurysmal dilatation of the thoracic aorta is noted. No acute bony abnormality is seen.  IMPRESSION: No change from the previous day.   Electronically Signed   By: Alcide Clever M.D.   On: 11/23/2014 09:45   Ct Head Wo Contrast  11/23/2014   CLINICAL DATA:  Generalized body weakness. End-stage renal disease. Anemia. Subsequent encounter.  EXAM: CT HEAD WITHOUT CONTRAST  TECHNIQUE: Contiguous axial images were obtained from the base of the skull through the vertex without intravenous contrast.   COMPARISON:  CT head 07/27/2014.  FINDINGS: No evidence for acute infarction, hemorrhage, mass lesion, hydrocephalus, or extra-axial fluid. Generalized atrophy. Hypoattenuation of white matter representing chronic microvascular ischemic change. Vascular calcification without signs of proximal vascular thrombosis. Calvarium intact. Clear sinuses and mastoids.  IMPRESSION: Chronic changes as described. Similar appearance compared with November 2015.   Electronically Signed   By: Unice Bailey.D.  On: 11/23/2014 10:50   Ct Angio Chest Aorta W/cm &/or Wo/cm  11/23/2014   CLINICAL DATA:  Low blood pressure, lethargic. Mid abdominal pain. AAA.  EXAM: CT ANGIOGRAPHY CHEST, ABDOMEN AND PELVIS  TECHNIQUE: Multidetector CT imaging through the chest, abdomen and pelvis was performed using the standard protocol during bolus administration of intravenous contrast. Multiplanar reconstructed images and MIPs were obtained and reviewed to evaluate the vascular anatomy.  CONTRAST:  80mL OMNIPAQUE IOHEXOL 350 MG/ML SOLN  COMPARISON:  Noncontrast chest CT 11/22/2014. Abdominal and pelvic CT 10/28/2014.  FINDINGS: CTA CHEST FINDINGS  There is diffuse arterial megaly. Diffuse atherosclerotic calcifications throughout the aorta. Multi vessel Coronary artery calcifications, most pronounced in the left anterior descending and circumflex arteries.  The descending thoracic aorta is markedly tortuous. The distal descending thoracic aorta in the left lower chest measures 4.2 cm. The aorta swings to the right of midline where the aorta measures 4.6 cm. This is not significantly changed. There is extensive mural plaque within this tortuous segment of distal descending thoracic aorta. No aneurysm.  2.1 cm spiculated nodule noted in the right lower lobe. When compared to study from 08/02/2014, this has not significantly changed in greatest diameter.  Scattered emphysematous changes in the lungs.  No pleural effusions.  Review of the MIP images  confirms the above findings.  CTA ABDOMEN AND PELVIS FINDINGS  Complex abdominal aortic aneurysm. Diffuse peripheral calcifications throughout the aorta. Maximum aortic diameter measures 7.2 cm. Extensive mural plaque throughout the aneurysm. The aneurysm ends at the aortic bifurcation. Iliac vessels are calcified, non aneurysmal. Maximum aortic diameter is stable when compared to recent abdominal CT has enlarged since 10/08/2011 when the aneurysm measured approximately 6.3 cm in greatest diameter. No dissection.  Layering high density material within the gallbladder, likely gallstones. Liver, spleen, pancreas, right adrenal gland unremarkable. Mild diffuse fullness of the left adrenal gland, likely hyperplasia. No hydronephrosis or focal abnormality within the kidneys.  Sigmoid diverticulosis. No evidence of active diverticulitis. Stomach and small bowel are decompressed. No free fluid, free air or adenopathy.  No acute bony abnormality or focal bone lesion. Degenerative changes in the lumbar spine.  Review of the MIP images confirms the above findings.  IMPRESSION: Complex thoracoabdominal aortic aneurysm with marked tortuosity in the lower chest. Extensive mural plaque in the lower chest and abdominal component. No evidence of significant change since most recent studies. No evidence of dissection.  2.1 cm spiculated nodule in the right lower lobe, not significantly changed in overall size since 08/02/2014.  Cardiomegaly, coronary artery disease.  Cholelithiasis.  No acute findings in the chest, abdomen or pelvis.   Electronically Signed   By: Charlett Nose M.D.   On: 11/23/2014 12:36   Ct Angio Abd/pel W/ And/or W/o  11/23/2014   CLINICAL DATA:  Low blood pressure, lethargic. Mid abdominal pain. AAA.  EXAM: CT ANGIOGRAPHY CHEST, ABDOMEN AND PELVIS  TECHNIQUE: Multidetector CT imaging through the chest, abdomen and pelvis was performed using the standard protocol during bolus administration of intravenous  contrast. Multiplanar reconstructed images and MIPs were obtained and reviewed to evaluate the vascular anatomy.  CONTRAST:  80mL OMNIPAQUE IOHEXOL 350 MG/ML SOLN  COMPARISON:  Noncontrast chest CT 11/22/2014. Abdominal and pelvic CT 10/28/2014.  FINDINGS: CTA CHEST FINDINGS  There is diffuse arterial megaly. Diffuse atherosclerotic calcifications throughout the aorta. Multi vessel Coronary artery calcifications, most pronounced in the left anterior descending and circumflex arteries.  The descending thoracic aorta is markedly tortuous. The distal descending thoracic aorta  in the left lower chest measures 4.2 cm. The aorta swings to the right of midline where the aorta measures 4.6 cm. This is not significantly changed. There is extensive mural plaque within this tortuous segment of distal descending thoracic aorta. No aneurysm.  2.1 cm spiculated nodule noted in the right lower lobe. When compared to study from 08/02/2014, this has not significantly changed in greatest diameter.  Scattered emphysematous changes in the lungs.  No pleural effusions.  Review of the MIP images confirms the above findings.  CTA ABDOMEN AND PELVIS FINDINGS  Complex abdominal aortic aneurysm. Diffuse peripheral calcifications throughout the aorta. Maximum aortic diameter measures 7.2 cm. Extensive mural plaque throughout the aneurysm. The aneurysm ends at the aortic bifurcation. Iliac vessels are calcified, non aneurysmal. Maximum aortic diameter is stable when compared to recent abdominal CT has enlarged since 10/08/2011 when the aneurysm measured approximately 6.3 cm in greatest diameter. No dissection.  Layering high density material within the gallbladder, likely gallstones. Liver, spleen, pancreas, right adrenal gland unremarkable. Mild diffuse fullness of the left adrenal gland, likely hyperplasia. No hydronephrosis or focal abnormality within the kidneys.  Sigmoid diverticulosis. No evidence of active diverticulitis. Stomach and  small bowel are decompressed. No free fluid, free air or adenopathy.  No acute bony abnormality or focal bone lesion. Degenerative changes in the lumbar spine.  Review of the MIP images confirms the above findings.  IMPRESSION: Complex thoracoabdominal aortic aneurysm with marked tortuosity in the lower chest. Extensive mural plaque in the lower chest and abdominal component. No evidence of significant change since most recent studies. No evidence of dissection.  2.1 cm spiculated nodule in the right lower lobe, not significantly changed in overall size since 08/02/2014.  Cardiomegaly, coronary artery disease.  Cholelithiasis.  No acute findings in the chest, abdomen or pelvis.   Electronically Signed   By: Charlett Nose M.D.   On: 11/23/2014 12:36     EKG Interpretation   Date/Time:  Wednesday November 23 2014 08:25:55 EST Ventricular Rate:  102 PR Interval:  172 QRS Duration: 97 QT Interval:  385 QTC Calculation: 501 R Axis:   -68 Text Interpretation:  Sinus tachycardia Atrial premature complex Left  anterior fascicular block Abnormal R-wave progression, early transition  Left ventricular hypertrophy Prolonged QT interval No significant change  since last tracing Confirmed by ZACKOWSKI  MD, SCOTT [phone]) on 11/23/2014  8:35:16 AM      MDM   Final diagnoses:  Weakness  AAA (abdominal aortic aneurysm)  UTI (lower urinary tract infection)  Delirium  Oral candidiasis  Dialysis patient    BP 106/50 mmHg  Pulse 93  Temp(Src) 98.7 F (37.1 C) (Rectal)  Resp 25  SpO2 100%  I have reviewed nursing notes and vital signs. I personally reviewed the imaging tests through PACS system  I reviewed available ER/hospitalization records thought the EMR     Fayrene Helper, PA-C 11/23/14 1453  Vanetta Mulders, MD 11/24/14 [phone]

## 2014-11-23 NOTE — ED Notes (Signed)
Phlebotomy at bedside.

## 2014-11-23 NOTE — ED Notes (Signed)
Patient returned from xray.

## 2014-11-23 NOTE — ED Notes (Signed)
PA Tran at bedside. 

## 2014-11-23 NOTE — H&P (Deleted)
Patient seen, independently examined and chart reviewed. I agree with exam, assessment and plan discussed with [name] PeaAllion Ellis, NP.Marland Kitchen.  [id number] year old woman presented from outpatient hemodialysis with complaint of lethargy and hypotension. Lethargy and hypotension spontaneously resolved but given her history of poor oral intake, recurrent diarrhea (discharged February 2016 after being treated for C. difficile colitis) she was referred for admission.  Discharged February 2016 after being treated for C. difficile colitis.  Only complaint in ER was abdominal pain  Patient does report difficulty swallowing, predominantly pain with swallowing, poor oral intake some generalized abdominal pain as well.  Basic ISTAT was consistent with end-stage renal disease. Hepatic function panel was unremarkable. Troponin point of care was negative. Lactic acid was modestly elevated. Platelet count was 66. WBC 10.6. Urinalysis was grossly positive. Imaging included a negative CT head, no acute abnormality seen on CT angios chest abdomen and pelvis. Chest x-ray no acute disease. EKG showed sinus tachycardia, no acute changes, prolonged QT seen.  PMH End-stage renal disease, hypertension, hyperlipidemia, COPD, anemia, atrial fibrillation on amiodarone, right lower lobe lung nodule   Objective: Afebrile, vital signs are stable. General:  Appears comfortable, calm. ENT: grossly normal hearing. White exudate consistent with oral candidiasis. Cardiovascular: Regular rate and rhythm, no murmur, rub or gallop. No lower extremity edema. Respiratory: Clear to auscultation bilaterally, no wheezes, rales or rhonchi. Normal respiratory effort. Abdomen: soft, nd, mild generalized tenderness Skin: no rash or induration  Musculoskeletal: grossly normal tone bilateral upper and lower extremities Psychiatric: grossly normal mood and affect, speech fluent and appropriate Neurologic: grossly non-focal.  [age] year old woman with failure to  thrive likely complicated by thrush, generalized abdominal pain, recurrent diarrhea worrisome for C. difficile and urinalysis suspicious for acute UTI as underlying etiology. Can stop Flagyl.   She had mild elevation of her lactic acid but she is clinically dehydrated and expect this will be improved spontaneously. Doubt sepsis as this point as she has remained normotensive since admission. Hypotension likely related to hemodialysis. She does however have thrombocytopenia which is a new finding and will need to be monitored closely. She has no evidence of bleeding.  Abdominal pain generalized abdominal pain, history of AAA, no evidence of complicating features on CT chest abdomen and pelvis.  Brendia Sacksaniel Sherri Mcarthy, MD [facility] [phone]

## 2014-11-23 NOTE — ED Notes (Signed)
CT notified that patient has current IV access.

## 2014-11-24 ENCOUNTER — Institutional Professional Consult (permissible substitution): Payer: Medicare Other | Admitting: Internal Medicine

## 2014-11-24 DIAGNOSIS — Z992 Dependence on renal dialysis: Secondary | ICD-10-CM

## 2014-11-24 DIAGNOSIS — N186 End stage renal disease: Secondary | ICD-10-CM

## 2014-11-24 DIAGNOSIS — A047 Enterocolitis due to Clostridium difficile: Principal | ICD-10-CM

## 2014-11-24 LAB — COMPREHENSIVE METABOLIC PANEL
ALT: 14 U/L (ref 0–35)
ANION GAP: 18 — AB (ref 5–15)
AST: 24 U/L (ref 0–37)
Albumin: 2.3 g/dL — ABNORMAL LOW (ref 3.5–5.2)
Alkaline Phosphatase: 66 U/L (ref 39–117)
BUN: 46 mg/dL — ABNORMAL HIGH (ref 6–23)
CALCIUM: 9.5 mg/dL (ref 8.4–10.5)
CO2: 18 mmol/L — ABNORMAL LOW (ref 19–32)
Chloride: 100 mmol/L (ref 96–112)
Creatinine, Ser: 7 mg/dL — ABNORMAL HIGH (ref 0.50–1.10)
GFR calc Af Amer: 6 mL/min — ABNORMAL LOW (ref >90)
GFR calc non Af Amer: 5 mL/min — ABNORMAL LOW (ref >90)
Glucose, Bld: 82 mg/dL (ref 70–99)
Potassium: 4.8 mmol/L (ref 3.5–5.1)
SODIUM: 136 mmol/L (ref 135–145)
TOTAL PROTEIN: 6 g/dL (ref 6.0–8.3)
Total Bilirubin: 0.8 mg/dL (ref 0.3–1.2)

## 2014-11-24 LAB — CBC
HEMATOCRIT: 38.7 % (ref 36.0–46.0)
Hemoglobin: 12.3 g/dL (ref 12.0–15.0)
MCH: 29.4 pg (ref 26.0–34.0)
MCHC: 31.8 g/dL (ref 30.0–36.0)
MCV: 92.6 fL (ref 78.0–100.0)
Platelets: 68 10*3/uL — ABNORMAL LOW (ref 150–400)
RBC: 4.18 MIL/uL (ref 3.87–5.11)
RDW: 17.5 % — AB (ref 11.5–15.5)
WBC: 10.5 10*3/uL (ref 4.0–10.5)

## 2014-11-24 LAB — CLOSTRIDIUM DIFFICILE BY PCR: CDIFFPCR: POSITIVE — AB

## 2014-11-24 NOTE — Progress Notes (Signed)
Lab called confirmed pt positive for C-DIFF.  Paged Dr. Burnetta SabinVann FYI

## 2014-11-24 NOTE — Progress Notes (Addendum)
INITIAL NUTRITION ASSESSMENT  Pt meets criteria for SEVERE MALNUTRITION in the context of chronic illness as evidenced by a 17.9% weight loss in 4 months and severe fat and muscle mass loss.  DOCUMENTATION CODES Per approved criteria  -Severe malnutrition in the context of chronic illness   INTERVENTION: Continue Resource Breeze po TID, each supplement provides 250 kcal and 9 grams of protein.  Recommend obtaining new weight to fully assess weight trends.  Encourage PO intake.  NUTRITION DIAGNOSIS: Malnutrition related to inadequate oral intake, odynophasia as evidenced by severe fat and muscle mass loss.   Goal: Pt to meet >/= 90% of their estimated nutrition needs   Monitor:  PO intake, weight trends, labs, I/O's  Reason for Assessment: MST  81 y.o. female  Admitting Dx: C. Diff colitis  ASSESSMENT: Pt with history of ESRD on dialysis on MWF, COPD, hypertension, age of fibrillation on amiodarone. She was recently discharged on 2/9 after being treated for C. difficile colitis. complaints related to lethargy and hypotension noted during hemodialysis. Pt with poor oral intake and recurrent diarrhea.  Daughter present at bedside. Pt reports a lack of appetite which has been ongoing over the past 1 month due to a sore throat. Daughter reports results show pt's throat is raw. Daughter reports pt drinks Ensure at home, however pt has been only drinking sips of it. No new weight recorded, however pt was weighed on bed scale which came out to be 107 lbs. Daughter reports weight has been 110 lbs. Noted pt with a 17.9% weight loss in 4 months. Pt is currently on a clear liquid diet with 20% meal completion. Pt has Resource Breeze ordered and reports drinking it. Will continue with current orders. RD to order Nepro Shake or Ensure once diet advances. Pt was encouraged to consumer her food at meals.  Nutrition Focused Physical Exam:  Subcutaneous Fat:  Orbital Region: N/A Upper Arm  Region: Severe depletion Thoracic and Lumbar Region: N/A  Muscle:  Temple Region: N/A Clavicle Bone Region: Severe depletion Clavicle and Acromion Bone Region: Severe depletion Scapular Bone Region: N/A Dorsal Hand: N/A Patellar Region: Moderate depletion Anterior Thigh Region: Moderate depletion Posterior Calf Region: Severe depletion  Edema: none  Labs: Low CO2 and GFR. High BUN and creatinine.  Height: Ht Readings from Last 1 Encounters:  10/28/14  (1.651 m)    Weight: Wt Readings from Last 1 Encounters:  11/01/14 116 lb 13.5 oz (53 kg)    Ideal Body Weight: 125 lbs  % Ideal Body Weight: 93%  Wt Readings from Last 10 Encounters:  11/01/14 116 lb 13.5 oz (53 kg)  08/05/14 134 lb 14.7 oz (61.2 kg)  10/08/11 183 lb (83.008 kg)  10/11/08 207 lb (93.895 kg)  08/09/08 194 lb (87.998 kg)    Usual Body Weight: 130 lbs  % Usual Body Weight: 89%  BMI:  Body Mass Index is 19.9 kg(m^2).  Estimated Nutritional Needs: Kcal: 1600-1800 Protein: 65-80 grams Fluid: 1.6 - 1.8 L/day  Skin: DTI on sacrum, Stage II pressure ulcer on sacrum  Diet Order: Diet clear liquid  EDUCATION NEEDS: -No education needs identified at this time   Intake/Output Summary (Last 24 hours) at 11/24/14 1028 Last data filed at 11/24/14 0200  Gross per 24 hour  Intake    100 ml  Output      0 ml  Net    100 ml    Last BM: 3/2  Labs:   Recent Labs Lab 11/23/14 [phone] 11/24/14  0352  NA 138 136  K 4.4 4.8  CL 103 100  CO2  --  18*  BUN 46* 46*  CREATININE 6.00* 7.00*  CALCIUM  --  9.5  GLUCOSE 69* 82    CBG (last 3)   Recent Labs  11/23/14 0939 11/23/14 1641  GLUCAP 78 84    Scheduled Meds: . allopurinol  100 mg Oral Daily  . amiodarone  200 mg Oral Daily  . arformoterol  15 mcg Nebulization BID  . budesonide  0.5 mg Nebulization BID  . cefTRIAXone (ROCEPHIN)  IV  1 g Intravenous Q24H  . feeding supplement (RESOURCE BREEZE)  1 Container Oral TID BM  .  midodrine  10 mg Oral BID WC  . multivitamin  1 tablet Oral QHS  . nystatin  5 mL Oral QID  . sodium chloride  3 mL Intravenous Q12H  . sodium chloride  3 mL Intravenous Q12H  . theophylline  200 mg Oral BID  . vancomycin  250 mg Oral 4 times per day    Continuous Infusions:   Past Medical History  Diagnosis Date  . Hypertension   . Hyperlipidemia   . COPD (chronic obstructive pulmonary disease)   . Anemia   . ESRD (end stage renal disease) on dialysis   . CHF (congestive heart failure)   . GERD (gastroesophageal reflux disease)   . Constipation   . PEA (Pulseless electrical activity) 04/19/2012    PEA arrest complicated by hyperkalemia, PNA, anocic encephalopathy, VDRF    Past Surgical History  Procedure Laterality Date  . Abdominal hysterectomy    . Arteriovenous fistula  06/02/12    left radiocephalic - failed  . Arteriovenous graft placement  07/14/12    Created at [facility]    Marijean NiemannStephanie La, MS, RD, LDN Pager # [phone] After hours/ weekend pager # [phone]

## 2014-11-24 NOTE — Progress Notes (Signed)
PROGRESS NOTE  [name] PieriniRuth [name] WUJ:[id number] DOB: April 16, 1929 DOA: 11/23/2014 PCP: [name],[name], MD  Assessment/Plan: Diarrhea/recent C. difficile colitis -Admit to medical floor -Repeat C. difficile PCR -Resume oral vancomycin and IV Flagyl; patient will need concurrent treatment since requiring antibiotics for presumed UTI  -Frequent watery stools so place Flexiseal -Seems volume depleted from recent diarrhea; this is also supported by elevated lactic acid level and since dialysis patient will provide periodic saline boluses -If C. difficile PCR is positive then patient may have recalcitrant colitis and may require infectious disease service consultation -No mention of colitis on CT scan report   UTI  -Urinalysis abnormal and certainly concerning for urinary tract infection given presentation -Follow up on urine culture and blood cultures -Begin empiric antibiotics with Rocephin    Thrombocytopenia -Acute problem  -Platelets < 100,000 so unable to use pharmacological DVT prophylaxis -Currently no signs of active bleeding  Odynophagia/suspected oral Candida -Begin nystatin swish and swallow -With slightly elevated QTC would not utilize IV Diflucan   Essential hypertension -Blood pressure soft and in review of medication administration record from home patient takes midodrine; continue midodrine -Was not on antihypertensive medications prior to admission   ESRD on dialysis -Unable to complete dialysis today -Nephrology   Chronic A-fib -Currently maintaining sinus rhythm/sinus tachycardia -Continue amiodarone; slight elevation in QTC but in review of EKGs from the past 6 months these are stable findings -CHADVASc = 5 but plts low   AAA  -Stable on CT angiogram today   COPD  -No evidence of wheezing on exam so appears compensated -Continue home nebulizers   Spiculated right Lung mass -Has not increased in size since last imaging November 2015 -Need to confirm  prior to discharge that patient has outpatient appointment set up with pulmonary medicine - reported patient had a PET scan prior to admission and is awaiting results   Protein-calorie malnutrition, severe -Add resource beverage while having diarrhea  PT Eval for tomm  Code Status: full Family Communication: patient Disposition Plan:    Consultants:  renal  Procedures:      HPI/Subjective: Asking for ICE cold water  Objective: Filed Vitals:   11/24/14 0500  BP: 121/50  Pulse: 73  Temp: 97.6 F (36.4 C)  Resp: 18    Intake/Output Summary (Last 24 hours) at 11/24/14 [id number] Last data filed at 11/24/14 0200  Gross per 24 hour  Intake    100 ml  Output      0 ml  Net    100 ml   There were no vitals filed for this visit.  Exam:   General:  Awake, mouth dry  Cardiovascular: rrr  Respiratory: clear  Abdomen: soft  Musculoskeletal: no edema   Data Reviewed: Basic Metabolic Panel:  Recent Labs Lab 11/23/14 0923 11/24/14 0352  NA 138 136  K 4.4 4.8  CL 103 100  CO2  --  18*  GLUCOSE 69* 82  BUN 46* 46*  CREATININE 6.00* 7.00*  CALCIUM  --  9.5   Liver Function Tests:  Recent Labs Lab 11/23/14 0905 11/24/14 0352  AST 27 24  ALT 16 14  ALKPHOS 65 66  BILITOT 0.9 0.8  PROT 6.8 6.0  ALBUMIN 2.5* 2.3*   No results for input(s): LIPASE, AMYLASE in the last 168 hours. No results for input(s): AMMONIA in the last 168 hours. CBC:  Recent Labs Lab 11/23/14 0905 11/23/14 0923 11/24/14 0352  WBC 10.6*  --  10.5  NEUTROABS 8.8*  --   --  HGB 13.3 17.7* 12.3  HCT 42.3 52.0* 38.7  MCV 94.6  --  92.6  PLT 66*  --  68*   Cardiac Enzymes: No results for input(s): CKTOTAL, CKMB, CKMBINDEX, TROPONINI in the last 168 hours. BNP (last 3 results) No results for input(s): BNP in the last 8760 hours.  ProBNP (last 3 results) No results for input(s): PROBNP in the last 8760 hours.  CBG:  Recent Labs Lab 11/23/14 0939 11/23/14 1641    GLUCAP 78 84    Recent Results (from the past 240 hour(s))  Culture, blood (routine x 2)     Status: None (Preliminary result)   Collection Time: 11/23/14  2:45 PM  Result Value Ref Range Status   Specimen Description BLOOD HAND RIGHT  Final   Special Requests BOTTLES DRAWN AEROBIC AND ANAEROBIC 5CC  Final   Culture   Final           BLOOD CULTURE RECEIVED NO GROWTH TO DATE CULTURE WILL BE HELD FOR 5 DAYS BEFORE ISSUING A FINAL NEGATIVE REPORT Performed at [facility]    Report Status PENDING  Incomplete  Culture, blood (routine x 2)     Status: None (Preliminary result)   Collection Time: 11/23/14  3:00 PM  Result Value Ref Range Status   Specimen Description BLOOD HAND RIGHT  Final   Special Requests BOTTLES DRAWN AEROBIC AND ANAEROBIC 5CC  Final   Culture   Final           BLOOD CULTURE RECEIVED NO GROWTH TO DATE CULTURE WILL BE HELD FOR 5 DAYS BEFORE ISSUING A FINAL NEGATIVE REPORT Performed at [facility]    Report Status PENDING  Incomplete     Studies: Dg Chest 2 View  11/23/2014   CLINICAL DATA:  Weakness and shortness of breath  EXAM: CHEST  2 VIEW  COMPARISON:  11/22/2014  FINDINGS: Cardiac shadow is within normal limits. The lungs are well aerated bilaterally. The known right middle lobe nodule adjacent to the hilum is again seen somewhat obscured by the patient's dialysis catheter. Diffuse aortic calcifications are noted. Tortuosity and aneurysmal dilatation of the thoracic aorta is noted. No acute bony abnormality is seen.  IMPRESSION: No change from the previous day.   Electronically Signed   By: Alcide Clever M.D.   On: 11/23/2014 09:45   Ct Head Wo Contrast  11/23/2014   CLINICAL DATA:  Generalized body weakness. End-stage renal disease. Anemia. Subsequent encounter.  EXAM: CT HEAD WITHOUT CONTRAST  TECHNIQUE: Contiguous axial images were obtained from the base of the skull through the vertex without intravenous contrast.  COMPARISON:  CT head  07/27/2014.  FINDINGS: No evidence for acute infarction, hemorrhage, mass lesion, hydrocephalus, or extra-axial fluid. Generalized atrophy. Hypoattenuation of white matter representing chronic microvascular ischemic change. Vascular calcification without signs of proximal vascular thrombosis. Calvarium intact. Clear sinuses and mastoids.  IMPRESSION: Chronic changes as described. Similar appearance compared with November 2015.   Electronically Signed   By: Davonna Belling M.D.   On: 11/23/2014 10:50   Ct Angio Chest Aorta W/cm &/or Wo/cm  11/23/2014   CLINICAL DATA:  Low blood pressure, lethargic. Mid abdominal pain. AAA.  EXAM: CT ANGIOGRAPHY CHEST, ABDOMEN AND PELVIS  TECHNIQUE: Multidetector CT imaging through the chest, abdomen and pelvis was performed using the standard protocol during bolus administration of intravenous contrast. Multiplanar reconstructed images and MIPs were obtained and reviewed to evaluate the vascular anatomy.  CONTRAST:  80mL OMNIPAQUE IOHEXOL 350 MG/ML SOLN  COMPARISON:  Noncontrast chest CT 11/22/2014. Abdominal and pelvic CT 10/28/2014.  FINDINGS: CTA CHEST FINDINGS  There is diffuse arterial megaly. Diffuse atherosclerotic calcifications throughout the aorta. Multi vessel Coronary artery calcifications, most pronounced in the left anterior descending and circumflex arteries.  The descending thoracic aorta is markedly tortuous. The distal descending thoracic aorta in the left lower chest measures 4.2 cm. The aorta swings to the right of midline where the aorta measures 4.6 cm. This is not significantly changed. There is extensive mural plaque within this tortuous segment of distal descending thoracic aorta. No aneurysm.  2.1 cm spiculated nodule noted in the right lower lobe. When compared to study from 08/02/2014, this has not significantly changed in greatest diameter.  Scattered emphysematous changes in the lungs.  No pleural effusions.  Review of the MIP images confirms the above  findings.  CTA ABDOMEN AND PELVIS FINDINGS  Complex abdominal aortic aneurysm. Diffuse peripheral calcifications throughout the aorta. Maximum aortic diameter measures 7.2 cm. Extensive mural plaque throughout the aneurysm. The aneurysm ends at the aortic bifurcation. Iliac vessels are calcified, non aneurysmal. Maximum aortic diameter is stable when compared to recent abdominal CT has enlarged since 10/08/2011 when the aneurysm measured approximately 6.3 cm in greatest diameter. No dissection.  Layering high density material within the gallbladder, likely gallstones. Liver, spleen, pancreas, right adrenal gland unremarkable. Mild diffuse fullness of the left adrenal gland, likely hyperplasia. No hydronephrosis or focal abnormality within the kidneys.  Sigmoid diverticulosis. No evidence of active diverticulitis. Stomach and small bowel are decompressed. No free fluid, free air or adenopathy.  No acute bony abnormality or focal bone lesion. Degenerative changes in the lumbar spine.  Review of the MIP images confirms the above findings.  IMPRESSION: Complex thoracoabdominal aortic aneurysm with marked tortuosity in the lower chest. Extensive mural plaque in the lower chest and abdominal component. No evidence of significant change since most recent studies. No evidence of dissection.  2.1 cm spiculated nodule in the right lower lobe, not significantly changed in overall size since 08/02/2014.  Cardiomegaly, coronary artery disease.  Cholelithiasis.  No acute findings in the chest, abdomen or pelvis.   Electronically Signed   By: Charlett Nose M.D.   On: 11/23/2014 12:36   Ct Angio Abd/pel W/ And/or W/o  11/23/2014   CLINICAL DATA:  Low blood pressure, lethargic. Mid abdominal pain. AAA.  EXAM: CT ANGIOGRAPHY CHEST, ABDOMEN AND PELVIS  TECHNIQUE: Multidetector CT imaging through the chest, abdomen and pelvis was performed using the standard protocol during bolus administration of intravenous contrast. Multiplanar  reconstructed images and MIPs were obtained and reviewed to evaluate the vascular anatomy.  CONTRAST:  80mL OMNIPAQUE IOHEXOL 350 MG/ML SOLN  COMPARISON:  Noncontrast chest CT 11/22/2014. Abdominal and pelvic CT 10/28/2014.  FINDINGS: CTA CHEST FINDINGS  There is diffuse arterial megaly. Diffuse atherosclerotic calcifications throughout the aorta. Multi vessel Coronary artery calcifications, most pronounced in the left anterior descending and circumflex arteries.  The descending thoracic aorta is markedly tortuous. The distal descending thoracic aorta in the left lower chest measures 4.2 cm. The aorta swings to the right of midline where the aorta measures 4.6 cm. This is not significantly changed. There is extensive mural plaque within this tortuous segment of distal descending thoracic aorta. No aneurysm.  2.1 cm spiculated nodule noted in the right lower lobe. When compared to study from 08/02/2014, this has not significantly changed in greatest diameter.  Scattered emphysematous changes in the lungs.  No pleural effusions.  Review of  the MIP images confirms the above findings.  CTA ABDOMEN AND PELVIS FINDINGS  Complex abdominal aortic aneurysm. Diffuse peripheral calcifications throughout the aorta. Maximum aortic diameter measures 7.2 cm. Extensive mural plaque throughout the aneurysm. The aneurysm ends at the aortic bifurcation. Iliac vessels are calcified, non aneurysmal. Maximum aortic diameter is stable when compared to recent abdominal CT has enlarged since 10/08/2011 when the aneurysm measured approximately 6.3 cm in greatest diameter. No dissection.  Layering high density material within the gallbladder, likely gallstones. Liver, spleen, pancreas, right adrenal gland unremarkable. Mild diffuse fullness of the left adrenal gland, likely hyperplasia. No hydronephrosis or focal abnormality within the kidneys.  Sigmoid diverticulosis. No evidence of active diverticulitis. Stomach and small bowel are  decompressed. No free fluid, free air or adenopathy.  No acute bony abnormality or focal bone lesion. Degenerative changes in the lumbar spine.  Review of the MIP images confirms the above findings.  IMPRESSION: Complex thoracoabdominal aortic aneurysm with marked tortuosity in the lower chest. Extensive mural plaque in the lower chest and abdominal component. No evidence of significant change since most recent studies. No evidence of dissection.  2.1 cm spiculated nodule in the right lower lobe, not significantly changed in overall size since 08/02/2014.  Cardiomegaly, coronary artery disease.  Cholelithiasis.  No acute findings in the chest, abdomen or pelvis.   Electronically Signed   By: Charlett Nose M.D.   On: 11/23/2014 12:36    Scheduled Meds: . allopurinol  100 mg Oral Daily  . amiodarone  200 mg Oral Daily  . arformoterol  15 mcg Nebulization BID  . budesonide  0.5 mg Nebulization BID  . cefTRIAXone (ROCEPHIN)  IV  1 g Intravenous Q24H  . feeding supplement (RESOURCE BREEZE)  1 Container Oral TID BM  . midodrine  10 mg Oral BID WC  . multivitamin  1 tablet Oral QHS  . nystatin  5 mL Oral QID  . sodium chloride  3 mL Intravenous Q12H  . sodium chloride  3 mL Intravenous Q12H  . theophylline  200 mg Oral BID  . vancomycin  250 mg Oral 4 times per day   Continuous Infusions:  Antibiotics Given (last 72 hours)    Date/Time Action Medication Dose Rate   11/23/14 1818 Given   vancomycin (VANCOCIN) 50 mg/mL oral solution 250 mg 250 mg    11/24/14 0036 Given   vancomycin (VANCOCIN) 50 mg/mL oral solution 250 mg 250 mg    11/24/14 0036 Given   cefTRIAXone (ROCEPHIN) 1 g in dextrose 5 % 50 mL IVPB 1 g 100 mL/hr      Active Problems:   Essential hypertension   AAA (abdominal aortic aneurysm)   ESRD on dialysis   A-fib   COPD (chronic obstructive pulmonary disease)   Lung mass   Diarrhea   C. difficile colitis   Protein-calorie malnutrition, severe   UTI (lower urinary tract  infection)   Thrombocytopenia   Odynophagia    Time spent: 35 min    VANN, JESSICA  [facility] Pager [phone]. If 7PM-7AM, please contact night-coverage at [email or web address], password [facility] 11/24/2014, 8:08 AM  LOS: 1 day

## 2014-11-24 NOTE — Progress Notes (Signed)
Patient refused rectal tube. Attempted twice.

## 2014-11-24 NOTE — Progress Notes (Signed)
Subjective:   Still tired but feels a little better. 2 loose stools overnight, not eating but is thirsty  Objective Filed Vitals:   11/23/14 1643 11/23/14 2000 11/23/14 2233 11/24/14 0500  BP: 110/54  117/58 121/50  Pulse: 100  101 73  Temp: 97.8 F (36.6 C)  97.9 F (36.6 C) 97.6 F (36.4 C)  TempSrc: Oral  Oral Axillary  Resp: 18  18 18   SpO2: 100% 95% 98% 98%   Physical Exam General: Thin, no acute distress. Alert but minimal verbal response. Mouth dry Heart: irregular, tachy Gr 2/6 M Lungs: CTA, shallow. unlabored Abdomen: soft, mild tenderness and guarding. Pulsating Abd mass that is tender Extremities: +1 LE edema Dialysis Access: R IJ perm cath  Dialysis Orders: MWF @ [facility] 3:30 160 47.5kgs 3K/2.25ca R IJ cath 3000u heparin 400/1.5 No meds   Assessment/Plan: 1. lethargy- head and chest CT no acute infarct/findings  2. Diarrhea- recent Cdiff. Completed po vanc. Cdiff pending 3. abd pain- abd CT. AAA diagnosed in nov-no evidenece of change 4. UTI- Started rocephin Doubt is source or if even is UTI or colonization 5. ESRD - MWF @ [facility], HD pending tomorrow 6. Hypertension/volume - 121/50- on midodrine, coming in under edw, losing body wt- difficulty swallowing 7. Anemia - hgb 12.3 no ESA or Fe- watch CBC 8. Metabolic bone disease - Ca+ 9.5 No vit D. No binders- last pth 21 and phos 5 2/24 hypercalcemic, look at binders and PTH 9. Nutrition - alb 2.3 poor appetite, renal vit- needs supplements severe malnutrition 10. Lung mass- found in nov- no significant change since nov 11. afib- amiodarone  12. Thrush on  Mycelex 13. EOL issues. Needs to be addressed. High risk for acute and chronic event in near future  Jetty Duhamel, NP [facility] [phone] 11/24/2014,9:03 AM  LOS: 1 day  I have seen and examined this patient and agree with the plan of care seen, eval, counseled family.  Do HD in am.  .  Akari Defelice L 11/24/2014, 10:00  AM    Additional Objective Labs: Basic Metabolic Panel:  Recent Labs Lab 11/23/14 0923 11/24/14 0352  NA 138 136  K 4.4 4.8  CL 103 100  CO2  --  18*  GLUCOSE 69* 82  BUN 46* 46*  CREATININE 6.00* 7.00*  CALCIUM  --  9.5   Liver Function Tests:  Recent Labs Lab 11/23/14 0905 11/24/14 0352  AST 27 24  ALT 16 14  ALKPHOS 65 66  BILITOT 0.9 0.8  PROT 6.8 6.0  ALBUMIN 2.5* 2.3*   No results for input(s): LIPASE, AMYLASE in the last 168 hours. CBC:  Recent Labs Lab 11/23/14 0905 11/23/14 0923 11/24/14 0352  WBC 10.6*  --  10.5  NEUTROABS 8.8*  --   --   HGB 13.3 17.7* 12.3  HCT 42.3 52.0* 38.7  MCV 94.6  --  92.6  PLT 66*  --  68*   Blood Culture    Component Value Date/Time   SDES BLOOD HAND RIGHT 11/23/2014 1500   SPECREQUEST BOTTLES DRAWN AEROBIC AND ANAEROBIC 5CC 11/23/2014 1500   CULT  11/23/2014 1500           BLOOD CULTURE RECEIVED NO GROWTH TO DATE CULTURE WILL BE HELD FOR 5 DAYS BEFORE ISSUING A FINAL NEGATIVE REPORT Performed at [facility]    REPTSTATUS PENDING 11/23/2014 1500    Cardiac Enzymes: No results for input(s): CKTOTAL, CKMB, CKMBINDEX, TROPONINI in the last 168 hours. CBG:  Recent Labs Lab 11/23/14 0939 11/23/14 1641  GLUCAP 78 84   Iron Studies: No results for input(s): IRON, TIBC, TRANSFERRIN, FERRITIN in the last 72 hours. @ Studies/Results: Dg Chest 2 View  11/23/2014   CLINICAL DATA:  Weakness and shortness of breath  EXAM: CHEST  2 VIEW  COMPARISON:  11/22/2014  FINDINGS: Cardiac shadow is within normal limits. The lungs are well aerated bilaterally. The known right middle lobe nodule adjacent to the hilum is again seen somewhat obscured by the patient's dialysis catheter. Diffuse aortic calcifications are noted. Tortuosity and aneurysmal dilatation of the thoracic aorta is noted. No acute bony abnormality is seen.  IMPRESSION: No change from the previous day.   Electronically Signed   By: Alcide Clever M.D.   On: 11/23/2014 09:45   Ct Head Wo Contrast  11/23/2014   CLINICAL DATA:  Generalized body weakness. End-stage renal disease. Anemia. Subsequent encounter.  EXAM: CT HEAD WITHOUT CONTRAST  TECHNIQUE: Contiguous axial images were obtained from the base of the skull through the vertex without intravenous contrast.  COMPARISON:  CT head 07/27/2014.  FINDINGS: No evidence for acute infarction, hemorrhage, mass lesion, hydrocephalus, or extra-axial fluid. Generalized atrophy. Hypoattenuation of white matter representing chronic microvascular ischemic change. Vascular calcification without signs of proximal vascular thrombosis. Calvarium intact. Clear sinuses and mastoids.  IMPRESSION: Chronic changes as described. Similar appearance compared with November 2015.   Electronically Signed   By: Davonna Belling M.D.   On: 11/23/2014 10:50   Ct Angio Chest Aorta W/cm &/or Wo/cm  11/23/2014   CLINICAL DATA:  Low blood pressure, lethargic. Mid abdominal pain. AAA.  EXAM: CT ANGIOGRAPHY CHEST, ABDOMEN AND PELVIS  TECHNIQUE: Multidetector CT imaging through the chest, abdomen and pelvis was performed using the standard protocol during bolus administration of intravenous contrast. Multiplanar reconstructed images and MIPs were obtained and reviewed to evaluate the vascular anatomy.  CONTRAST:  80mL OMNIPAQUE IOHEXOL 350 MG/ML SOLN  COMPARISON:  Noncontrast chest CT 11/22/2014. Abdominal and pelvic CT 10/28/2014.  FINDINGS: CTA CHEST FINDINGS  There is diffuse arterial megaly. Diffuse atherosclerotic calcifications throughout the aorta. Multi vessel Coronary artery calcifications, most pronounced in the left anterior descending and circumflex arteries.  The descending thoracic aorta is markedly tortuous. The distal descending thoracic aorta in the left lower chest measures 4.2 cm. The aorta swings to the right of midline where the aorta measures 4.6 cm. This is not significantly changed. There is extensive mural plaque  within this tortuous segment of distal descending thoracic aorta. No aneurysm.  2.1 cm spiculated nodule noted in the right lower lobe. When compared to study from 08/02/2014, this has not significantly changed in greatest diameter.  Scattered emphysematous changes in the lungs.  No pleural effusions.  Review of the MIP images confirms the above findings.  CTA ABDOMEN AND PELVIS FINDINGS  Complex abdominal aortic aneurysm. Diffuse peripheral calcifications throughout the aorta. Maximum aortic diameter measures 7.2 cm. Extensive mural plaque throughout the aneurysm. The aneurysm ends at the aortic bifurcation. Iliac vessels are calcified, non aneurysmal. Maximum aortic diameter is stable when compared to recent abdominal CT has enlarged since 10/08/2011 when the aneurysm measured approximately 6.3 cm in greatest diameter. No dissection.  Layering high density material within the gallbladder, likely gallstones. Liver, spleen, pancreas, right adrenal gland unremarkable. Mild diffuse fullness of the left adrenal gland, likely hyperplasia. No hydronephrosis or focal abnormality within the kidneys.  Sigmoid diverticulosis. No evidence of active diverticulitis. Stomach and small bowel are decompressed. No  free fluid, free air or adenopathy.  No acute bony abnormality or focal bone lesion. Degenerative changes in the lumbar spine.  Review of the MIP images confirms the above findings.  IMPRESSION: Complex thoracoabdominal aortic aneurysm with marked tortuosity in the lower chest. Extensive mural plaque in the lower chest and abdominal component. No evidence of significant change since most recent studies. No evidence of dissection.  2.1 cm spiculated nodule in the right lower lobe, not significantly changed in overall size since 08/02/2014.  Cardiomegaly, coronary artery disease.  Cholelithiasis.  No acute findings in the chest, abdomen or pelvis.   Electronically Signed   By: Charlett NoseKevin  Dover M.D.   On: 11/23/2014 12:36   Ct  Angio Abd/pel W/ And/or W/o  11/23/2014   CLINICAL DATA:  Low blood pressure, lethargic. Mid abdominal pain. AAA.  EXAM: CT ANGIOGRAPHY CHEST, ABDOMEN AND PELVIS  TECHNIQUE: Multidetector CT imaging through the chest, abdomen and pelvis was performed using the standard protocol during bolus administration of intravenous contrast. Multiplanar reconstructed images and MIPs were obtained and reviewed to evaluate the vascular anatomy.  CONTRAST:  80mL OMNIPAQUE IOHEXOL 350 MG/ML SOLN  COMPARISON:  Noncontrast chest CT 11/22/2014. Abdominal and pelvic CT 10/28/2014.  FINDINGS: CTA CHEST FINDINGS  There is diffuse arterial megaly. Diffuse atherosclerotic calcifications throughout the aorta. Multi vessel Coronary artery calcifications, most pronounced in the left anterior descending and circumflex arteries.  The descending thoracic aorta is markedly tortuous. The distal descending thoracic aorta in the left lower chest measures 4.2 cm. The aorta swings to the right of midline where the aorta measures 4.6 cm. This is not significantly changed. There is extensive mural plaque within this tortuous segment of distal descending thoracic aorta. No aneurysm.  2.1 cm spiculated nodule noted in the right lower lobe. When compared to study from 08/02/2014, this has not significantly changed in greatest diameter.  Scattered emphysematous changes in the lungs.  No pleural effusions.  Review of the MIP images confirms the above findings.  CTA ABDOMEN AND PELVIS FINDINGS  Complex abdominal aortic aneurysm. Diffuse peripheral calcifications throughout the aorta. Maximum aortic diameter measures 7.2 cm. Extensive mural plaque throughout the aneurysm. The aneurysm ends at the aortic bifurcation. Iliac vessels are calcified, non aneurysmal. Maximum aortic diameter is stable when compared to recent abdominal CT has enlarged since 10/08/2011 when the aneurysm measured approximately 6.3 cm in greatest diameter. No dissection.  Layering high  density material within the gallbladder, likely gallstones. Liver, spleen, pancreas, right adrenal gland unremarkable. Mild diffuse fullness of the left adrenal gland, likely hyperplasia. No hydronephrosis or focal abnormality within the kidneys.  Sigmoid diverticulosis. No evidence of active diverticulitis. Stomach and small bowel are decompressed. No free fluid, free air or adenopathy.  No acute bony abnormality or focal bone lesion. Degenerative changes in the lumbar spine.  Review of the MIP images confirms the above findings.  IMPRESSION: Complex thoracoabdominal aortic aneurysm with marked tortuosity in the lower chest. Extensive mural plaque in the lower chest and abdominal component. No evidence of significant change since most recent studies. No evidence of dissection.  2.1 cm spiculated nodule in the right lower lobe, not significantly changed in overall size since 08/02/2014.  Cardiomegaly, coronary artery disease.  Cholelithiasis.  No acute findings in the chest, abdomen or pelvis.   Electronically Signed   By: Charlett NoseKevin  Dover M.D.   On: 11/23/2014 12:36   Medications:   . allopurinol  100 mg Oral Daily  . amiodarone  200 mg Oral Daily  .  arformoterol  15 mcg Nebulization BID  . budesonide  0.5 mg Nebulization BID  . cefTRIAXone (ROCEPHIN)  IV  1 g Intravenous Q24H  . feeding supplement (RESOURCE BREEZE)  1 Container Oral TID BM  . midodrine  10 mg Oral BID WC  . multivitamin  1 tablet Oral QHS  . nystatin  5 mL Oral QID  . sodium chloride  3 mL Intravenous Q12H  . sodium chloride  3 mL Intravenous Q12H  . theophylline  200 mg Oral BID  . vancomycin  250 mg Oral 4 times per day

## 2014-11-25 DIAGNOSIS — B370 Candidal stomatitis: Secondary | ICD-10-CM | POA: Diagnosis present

## 2014-11-25 DIAGNOSIS — I714 Abdominal aortic aneurysm, without rupture: Secondary | ICD-10-CM

## 2014-11-25 DIAGNOSIS — E43 Unspecified severe protein-calorie malnutrition: Secondary | ICD-10-CM

## 2014-11-25 LAB — CBC
HCT: 35.8 % — ABNORMAL LOW (ref 36.0–46.0)
Hemoglobin: 11.7 g/dL — ABNORMAL LOW (ref 12.0–15.0)
MCH: 29.4 pg (ref 26.0–34.0)
MCHC: 32.7 g/dL (ref 30.0–36.0)
MCV: 89.9 fL (ref 78.0–100.0)
PLATELETS: 61 10*3/uL — AB (ref 150–400)
RBC: 3.98 MIL/uL (ref 3.87–5.11)
RDW: 17.2 % — AB (ref 11.5–15.5)
WBC: 11.2 10*3/uL — ABNORMAL HIGH (ref 4.0–10.5)

## 2014-11-25 LAB — BASIC METABOLIC PANEL
Anion gap: 22 — ABNORMAL HIGH (ref 5–15)
BUN: 54 mg/dL — ABNORMAL HIGH (ref 6–23)
CALCIUM: 9.7 mg/dL (ref 8.4–10.5)
CHLORIDE: 101 mmol/L (ref 96–112)
CO2: 17 mmol/L — ABNORMAL LOW (ref 19–32)
CREATININE: 8.25 mg/dL — AB (ref 0.50–1.10)
GFR calc Af Amer: 5 mL/min — ABNORMAL LOW (ref >90)
GFR calc non Af Amer: 4 mL/min — ABNORMAL LOW (ref >90)
GLUCOSE: 71 mg/dL (ref 70–99)
Potassium: 4.5 mmol/L (ref 3.5–5.1)
SODIUM: 140 mmol/L (ref 135–145)

## 2014-11-25 LAB — URINE CULTURE
CULTURE: NO GROWTH
Colony Count: NO GROWTH

## 2014-11-25 MED ORDER — SODIUM CHLORIDE 0.9 % IV SOLN: 100.0000 mL | INTRAVENOUS | Status: DC | PRN

## 2014-11-25 MED ORDER — HEPARIN SODIUM (PORCINE) 1000 UNIT/ML DIALYSIS: 3000.0000 [IU] | Freq: Once | INTRAMUSCULAR | Status: DC

## 2014-11-25 MED ORDER — LIDOCAINE HCL (PF) 1 % IJ SOLN: 5.0000 mL | INTRAMUSCULAR | Status: DC | PRN

## 2014-11-25 MED ORDER — ALTEPLASE 2 MG IJ SOLR
2.0000 mg | Freq: Once | INTRAMUSCULAR | Status: DC | PRN
  Filled 2014-11-25: qty 2

## 2014-11-25 MED ORDER — HEPARIN SODIUM (PORCINE) 1000 UNIT/ML DIALYSIS: 1000.0000 [IU] | INTRAMUSCULAR | Status: DC | PRN

## 2014-11-25 MED ORDER — NEPRO/CARBSTEADY PO LIQD
237.0000 mL | ORAL | Status: DC | PRN
  Filled 2014-11-25: qty 237

## 2014-11-25 MED ORDER — MIDODRINE HCL 5 MG PO TABS
ORAL_TABLET | ORAL | Status: AC
  Administered 2014-11-25: 10 mg via ORAL
  Filled 2014-11-25: qty 2

## 2014-11-25 MED ORDER — PENTAFLUOROPROP-TETRAFLUOROETH EX AERO: 1.0000 "application " | INHALATION_SPRAY | CUTANEOUS | Status: DC | PRN

## 2014-11-25 MED ORDER — LIDOCAINE-PRILOCAINE 2.5-2.5 % EX CREA
1.0000 "application " | TOPICAL_CREAM | CUTANEOUS | Status: DC | PRN
  Filled 2014-11-25: qty 5

## 2014-11-25 NOTE — Progress Notes (Signed)
PROGRESS NOTE  [name] ZOX:[id number] DOB: Jun 24, 1929 DOA: 11/23/2014 PCP: [name], MD  Assessment/Plan: c diff: pcr positive. Suspect continued infection, rather than new episode. Likely needs prolonged vancomycin taper. Will d/w ID.  Abnormal UA: doubt UTI. Culture negative. D/c ceftriaxone, particularly given continued CDI    Thrombocytopenia Secondary to infection. No bleeding  Odynophagia/suspected oral Candida continue nystatin   ESRD on dialysis Nephrology following   Chronic A-fib Not on anticoagulation   AAA  Increased size compared to 2013. Not a surgical candidate per family. I agree   COPD  stable   Spiculated right Lung mass -Has not increased in size since last imaging November 2015 Outpatient f/u   Protein-calorie malnutrition, severe CDI, thrush contributing  Prognosis guarded. Discussed with family who voice understanding. May need palliative consult  Code Status: full Family Communication: daughter and granddaughter at bedsid Disposition Plan: back to SNF when improved   Consultants:  renal  Procedures:   HPI/Subjective: Weak. No vomiting. No pain.   Objective: Filed Vitals:   11/25/14 1238  BP:   Pulse: 93  Temp:   Resp: 24    Intake/Output Summary (Last 24 hours) at 11/25/14 1603 Last data filed at 11/25/14 1238  Gross per 24 hour  Intake    240 ml  Output      3 ml  Net    237 ml   Filed Weights   11/24/14 2228 11/25/14 0830 11/25/14 1238  Weight: 53.116 kg (117 lb 1.6 oz) 53.1 kg (117 lb 1 oz) 53.1 kg (117 lb 1 oz)    Exam:   General:  Alert. Weak appearing. appropriate  HEENT: dry MM. No white plaques  Cardiovascular: rrr without MGR  Respiratory: clear without WRR  Abdomen: soft, NT, ND  Musculoskeletal: no edema   Data Reviewed: Basic Metabolic Panel:  Recent Labs Lab 11/23/14 0923 11/24/14 0352 11/25/14 0539  NA 138 136 140  K 4.4 4.8 4.5  CL 103 100 101  CO2  --  18* 17*   GLUCOSE 69* 82 71  BUN 46* 46* 54*  CREATININE 6.00* 7.00* 8.25*  CALCIUM  --  9.5 9.7   Liver Function Tests:  Recent Labs Lab 11/23/14 0905 11/24/14 0352  AST 27 24  ALT 16 14  ALKPHOS 65 66  BILITOT 0.9 0.8  PROT 6.8 6.0  ALBUMIN 2.5* 2.3*   No results for input(s): LIPASE, AMYLASE in the last 168 hours. No results for input(s): AMMONIA in the last 168 hours. CBC:  Recent Labs Lab 11/23/14 0905 11/23/14 0923 11/24/14 0352 11/25/14 0539  WBC 10.6*  --  10.5 11.2*  NEUTROABS 8.8*  --   --   --   HGB 13.3 17.7* 12.3 11.7*  HCT 42.3 52.0* 38.7 35.8*  MCV 94.6  --  92.6 89.9  PLT 66*  --  68* 61*   Cardiac Enzymes: No results for input(s): CKTOTAL, CKMB, CKMBINDEX, TROPONINI in the last 168 hours. BNP (last 3 results) No results for input(s): BNP in the last 8760 hours.  ProBNP (last 3 results) No results for input(s): PROBNP in the last 8760 hours.  CBG:  Recent Labs Lab 11/23/14 0939 11/23/14 1641  GLUCAP 78 84    Recent Results (from the past 240 hour(s))  Urine culture     Status: None   Collection Time: 11/23/14  9:45 AM  Result Value Ref Range Status   Specimen Description URINE, CATHETERIZED  Final   Special Requests NONE  Final   Colony  Count NO GROWTH [facility]formed at [facility]   Final   Culture NO GROWTH [facility]formed at [facility]   Final   Report Status 11/25/2014 FINAL  Final  Culture, blood (routine x 2)     Status: None (Preliminary result)   Collection Time: 11/23/14  2:45 PM  Result Value Ref Range Status   Specimen Description BLOOD HAND RIGHT  Final   Special Requests BOTTLES DRAWN AEROBIC AND ANAEROBIC 5CC  Final   Culture   Final           BLOOD CULTURE RECEIVED NO GROWTH TO DATE CULTURE WILL BE HELD FOR 5 DAYS BEFORE ISSUING A FINAL NEGATIVE REPORT Performed at [facility]    Report Status PENDING  Incomplete  Culture, blood (routine x 2)     Status: None (Preliminary result)   Collection Time:  11/23/14  3:00 PM  Result Value Ref Range Status   Specimen Description BLOOD HAND RIGHT  Final   Special Requests BOTTLES DRAWN AEROBIC AND ANAEROBIC 5CC  Final   Culture   Final           BLOOD CULTURE RECEIVED NO GROWTH TO DATE CULTURE WILL BE HELD FOR 5 DAYS BEFORE ISSUING A FINAL NEGATIVE REPORT Performed at [facility]    Report Status PENDING  Incomplete  Clostridium Difficile by PCR     Status: Abnormal   Collection Time: 11/23/14  6:25 PM  Result Value Ref Range Status   C difficile by pcr POSITIVE (A) NEGATIVE Final    Comment: CRITICAL RESULT CALLED TO, READ BACK BY AND VERIFIED WITH: CHANEY,T RN @ 1247 11/24/14 LEONARD,A      Studies: No results found.  Scheduled Meds: . allopurinol  100 mg Oral Daily  . amiodarone  200 mg Oral Daily  . arformoterol  15 mcg Nebulization BID  . budesonide  0.5 mg Nebulization BID  . cefTRIAXone (ROCEPHIN)  IV  1 g Intravenous Q24H  . feeding supplement (RESOURCE BREEZE)  1 Container Oral TID BM  . midodrine  10 mg Oral BID WC  . multivitamin  1 tablet Oral QHS  . nystatin  5 mL Oral QID  . sodium chloride  3 mL Intravenous Q12H  . sodium chloride  3 mL Intravenous Q12H  . theophylline  200 mg Oral BID  . vancomycin  250 mg Oral 4 times per day   Continuous Infusions:  Antibiotics Given (last 72 hours)    Date/Time Action Medication Dose Rate   11/23/14 1818 Given   vancomycin (VANCOCIN) 50 mg/mL oral solution 250 mg 250 mg    11/24/14 0036 Given   vancomycin (VANCOCIN) 50 mg/mL oral solution 250 mg 250 mg    11/24/14 0036 Given   cefTRIAXone (ROCEPHIN) 1 g in dextrose 5 % 50 mL IVPB 1 g 100 mL/hr   11/24/14 1300 Given   vancomycin (VANCOCIN) 50 mg/mL oral solution 250 mg 250 mg    11/24/14 1735 Given   vancomycin (VANCOCIN) 50 mg/mL oral solution 250 mg 250 mg    11/25/14 0025 Given   cefTRIAXone (ROCEPHIN) 1 g in dextrose 5 % 50 mL IVPB 1 g 100 mL/hr      Active Problems:   Essential hypertension   AAA  (abdominal aortic aneurysm)   ESRD on dialysis   A-fib   COPD (chronic obstructive pulmonary disease)   Lung mass   Diarrhea   C. difficile colitis   Protein-calorie malnutrition, severe   UTI (lower  urinary tract infection)   Thrombocytopenia   Odynophagia    Time spent: 35 min  Zaylen Susman L  [facility]  [email or web address], password [facility] 11/25/2014, 4:03 PM  LOS: 2 days

## 2014-11-25 NOTE — Progress Notes (Signed)
Subjective:  "weak", for HD this am  Objective Vital signs in last 24 hours: Filed Vitals:   11/24/14 1829 11/24/14 2106 11/24/14 2228 11/25/14 0444  BP: 123/51  117/55 120/50  Pulse: 76  107 103  Temp: 97.2 F (36.2 C)  98.5 F (36.9 C) 98.7 F (37.1 C)  TempSrc: Oral  Oral Oral  Resp: Weight:   53.116 kg (117 lb 1.6 oz)   SpO2: 99% 98% 100% 97%    Physical Exam: General: Alert thin Chronically ill Elderly BF NAD Heart: irregular,  With 2/6 SEM apex, no rub Lungs: CTA, with poor resp response and. unlabored Abdomen: soft, mild tenderness  Epigastric area and guarding. Sl tender Pulsating Abd mass  Extremities: +1 LE edema Dialysis Access: R IJ perm cath  Dialysis Orders: MWF @ [facility] 3:30 160 47.5kgs 3K/2.25ca R IJ cath 3000u heparin 400/1.5 No meds   Assessment/Plan: 1. Lethargy- with Recurrent C. Diff/ head and chest CT no acute infarct/findings  2. Recurrent C Difff. Back  On  po vanc.  3. Abd pain- abd CT. AAA diagnosed in nov-no evidenece of change 4. UTI- Started rocephin Doubt is source or if even is UTI or colonization 5. ESRD - MWF @ [facility], HD today 6. Hypertension/volume - 121/50- on midodrine  bid , coming in under edw, losing body wt- difficulty swallowing 7. Anemia - hgb 12.3 >11.7 no ESA or Fe-  8. Metabolic bone disease - with Hypercalcemia- with Corrected ^ Ca+= 11.1 No vit D. No binders- last pth 21 and phos 5 2/24 / using low ca hd bath  9. Nutrition - alb 2.3 poor appetite, renal vit- on Breeze tid ,  severe malnutrition 10. Lung mass- found in nov- no significant change since nov 11. afib- amiodarone  12. Thrush on Mycelex 13. EOL issues. Severe FTT  Lenny Pastel, PA-C [facility] [phone] 11/25/2014,8:15 AM  LOS: 2 days  I have seen and examined this patient and agree with the plan of care seen eval, examined.  Concerned about acute and chronic status. .  Emon Lance L 11/25/2014, 8:51  AM  Labs: Basic Metabolic Panel:  Recent Labs Lab 11/23/14 0923 11/24/14 0352 11/25/14 0539  NA 138 136 140  K 4.4 4.8 4.5  CL 103 100 101  CO2  --  18* 17*  GLUCOSE 69* 82 71  BUN 46* 46* 54*  CREATININE 6.00* 7.00* 8.25*  CALCIUM  --  9.5 9.7   Liver Function Tests:  Recent Labs Lab 11/23/14 0905 11/24/14 0352  AST 27 24  ALT 16 14  ALKPHOS 65 66  BILITOT 0.9 0.8  PROT 6.8 6.0  ALBUMIN 2.5* 2.3*  CBC:  Recent Labs Lab 11/23/14 0905 11/23/14 0923 11/24/14 0352 11/25/14 0539  WBC 10.6*  --  10.5 11.2*  NEUTROABS 8.8*  --   --   --   HGB 13.3 17.7* 12.3 11.7*  HCT 42.3 52.0* 38.7 35.8*  MCV 94.6  --  92.6 89.9  PLT 66*  --  68* 61*   Cardiac Enzymes: No results for input(s): CKTOTAL, CKMB, CKMBINDEX, TROPONINI in the last 168 hours. CBG:  Recent Labs Lab 11/23/14 0939 11/23/14 1641  GLUCAP 78 84    Studies/Results: Dg Chest 2 View  11/23/2014   CLINICAL DATA:  Weakness and shortness of breath  EXAM: CHEST  2 VIEW  COMPARISON:  11/22/2014  FINDINGS: Cardiac shadow is within normal limits. The lungs are well aerated bilaterally. The known  right middle lobe nodule adjacent to the hilum is again seen somewhat obscured by the patient's dialysis catheter. Diffuse aortic calcifications are noted. Tortuosity and aneurysmal dilatation of the thoracic aorta is noted. No acute bony abnormality is seen.  IMPRESSION: No change from the previous day.   Electronically Signed   By: Alcide Clever M.D.   On: 11/23/2014 09:45   Ct Head Wo Contrast  11/23/2014   CLINICAL DATA:  Generalized body weakness. End-stage renal disease. Anemia. Subsequent encounter.  EXAM: CT HEAD WITHOUT CONTRAST  TECHNIQUE: Contiguous axial images were obtained from the base of the skull through the vertex without intravenous contrast.  COMPARISON:  CT head 07/27/2014.  FINDINGS: No evidence for acute infarction, hemorrhage, mass lesion, hydrocephalus, or extra-axial fluid. Generalized atrophy.  Hypoattenuation of white matter representing chronic microvascular ischemic change. Vascular calcification without signs of proximal vascular thrombosis. Calvarium intact. Clear sinuses and mastoids.  IMPRESSION: Chronic changes as described. Similar appearance compared with November 2015.   Electronically Signed   By: Davonna Belling M.D.   On: 11/23/2014 10:50   Ct Angio Chest Aorta W/cm &/or Wo/cm  11/23/2014   CLINICAL DATA:  Low blood pressure, lethargic. Mid abdominal pain. AAA.  EXAM: CT ANGIOGRAPHY CHEST, ABDOMEN AND PELVIS  TECHNIQUE: Multidetector CT imaging through the chest, abdomen and pelvis was performed using the standard protocol during bolus administration of intravenous contrast. Multiplanar reconstructed images and MIPs were obtained and reviewed to evaluate the vascular anatomy.  CONTRAST:  80mL OMNIPAQUE IOHEXOL 350 MG/ML SOLN  COMPARISON:  Noncontrast chest CT 11/22/2014. Abdominal and pelvic CT 10/28/2014.  FINDINGS: CTA CHEST FINDINGS  There is diffuse arterial megaly. Diffuse atherosclerotic calcifications throughout the aorta. Multi vessel Coronary artery calcifications, most pronounced in the left anterior descending and circumflex arteries.  The descending thoracic aorta is markedly tortuous. The distal descending thoracic aorta in the left lower chest measures 4.2 cm. The aorta swings to the right of midline where the aorta measures 4.6 cm. This is not significantly changed. There is extensive mural plaque within this tortuous segment of distal descending thoracic aorta. No aneurysm.  2.1 cm spiculated nodule noted in the right lower lobe. When compared to study from 08/02/2014, this has not significantly changed in greatest diameter.  Scattered emphysematous changes in the lungs.  No pleural effusions.  Review of the MIP images confirms the above findings.  CTA ABDOMEN AND PELVIS FINDINGS  Complex abdominal aortic aneurysm. Diffuse peripheral calcifications throughout the aorta. Maximum  aortic diameter measures 7.2 cm. Extensive mural plaque throughout the aneurysm. The aneurysm ends at the aortic bifurcation. Iliac vessels are calcified, non aneurysmal. Maximum aortic diameter is stable when compared to recent abdominal CT has enlarged since 10/08/2011 when the aneurysm measured approximately 6.3 cm in greatest diameter. No dissection.  Layering high density material within the gallbladder, likely gallstones. Liver, spleen, pancreas, right adrenal gland unremarkable. Mild diffuse fullness of the left adrenal gland, likely hyperplasia. No hydronephrosis or focal abnormality within the kidneys.  Sigmoid diverticulosis. No evidence of active diverticulitis. Stomach and small bowel are decompressed. No free fluid, free air or adenopathy.  No acute bony abnormality or focal bone lesion. Degenerative changes in the lumbar spine.  Review of the MIP images confirms the above findings.  IMPRESSION: Complex thoracoabdominal aortic aneurysm with marked tortuosity in the lower chest. Extensive mural plaque in the lower chest and abdominal component. No evidence of significant change since most recent studies. No evidence of dissection.  2.1 cm spiculated nodule  in the right lower lobe, not significantly changed in overall size since 08/02/2014.  Cardiomegaly, coronary artery disease.  Cholelithiasis.  No acute findings in the chest, abdomen or pelvis.   Electronically Signed   By: Charlett NoseKevin  Dover M.D.   On: 11/23/2014 12:36   Ct Angio Abd/pel W/ And/or W/o  11/23/2014   CLINICAL DATA:  Low blood pressure, lethargic. Mid abdominal pain. AAA.  EXAM: CT ANGIOGRAPHY CHEST, ABDOMEN AND PELVIS  TECHNIQUE: Multidetector CT imaging through the chest, abdomen and pelvis was performed using the standard protocol during bolus administration of intravenous contrast. Multiplanar reconstructed images and MIPs were obtained and reviewed to evaluate the vascular anatomy.  CONTRAST:  80mL OMNIPAQUE IOHEXOL 350 MG/ML SOLN   COMPARISON:  Noncontrast chest CT 11/22/2014. Abdominal and pelvic CT 10/28/2014.  FINDINGS: CTA CHEST FINDINGS  There is diffuse arterial megaly. Diffuse atherosclerotic calcifications throughout the aorta. Multi vessel Coronary artery calcifications, most pronounced in the left anterior descending and circumflex arteries.  The descending thoracic aorta is markedly tortuous. The distal descending thoracic aorta in the left lower chest measures 4.2 cm. The aorta swings to the right of midline where the aorta measures 4.6 cm. This is not significantly changed. There is extensive mural plaque within this tortuous segment of distal descending thoracic aorta. No aneurysm.  2.1 cm spiculated nodule noted in the right lower lobe. When compared to study from 08/02/2014, this has not significantly changed in greatest diameter.  Scattered emphysematous changes in the lungs.  No pleural effusions.  Review of the MIP images confirms the above findings.  CTA ABDOMEN AND PELVIS FINDINGS  Complex abdominal aortic aneurysm. Diffuse peripheral calcifications throughout the aorta. Maximum aortic diameter measures 7.2 cm. Extensive mural plaque throughout the aneurysm. The aneurysm ends at the aortic bifurcation. Iliac vessels are calcified, non aneurysmal. Maximum aortic diameter is stable when compared to recent abdominal CT has enlarged since 10/08/2011 when the aneurysm measured approximately 6.3 cm in greatest diameter. No dissection.  Layering high density material within the gallbladder, likely gallstones. Liver, spleen, pancreas, right adrenal gland unremarkable. Mild diffuse fullness of the left adrenal gland, likely hyperplasia. No hydronephrosis or focal abnormality within the kidneys.  Sigmoid diverticulosis. No evidence of active diverticulitis. Stomach and small bowel are decompressed. No free fluid, free air or adenopathy.  No acute bony abnormality or focal bone lesion. Degenerative changes in the lumbar spine.   Review of the MIP images confirms the above findings.  IMPRESSION: Complex thoracoabdominal aortic aneurysm with marked tortuosity in the lower chest. Extensive mural plaque in the lower chest and abdominal component. No evidence of significant change since most recent studies. No evidence of dissection.  2.1 cm spiculated nodule in the right lower lobe, not significantly changed in overall size since 08/02/2014.  Cardiomegaly, coronary artery disease.  Cholelithiasis.  No acute findings in the chest, abdomen or pelvis.   Electronically Signed   By: Charlett NoseKevin  Dover M.D.   On: 11/23/2014 12:36   Medications:   . allopurinol  100 mg Oral Daily  . amiodarone  200 mg Oral Daily  . arformoterol  15 mcg Nebulization BID  . budesonide  0.5 mg Nebulization BID  . cefTRIAXone (ROCEPHIN)  IV  1 g Intravenous Q24H  . feeding supplement (RESOURCE BREEZE)  1 Container Oral TID BM  . midodrine  10 mg Oral BID WC  . multivitamin  1 tablet Oral QHS  . nystatin  5 mL Oral QID  . sodium chloride  3 mL Intravenous  Q12H  . sodium chloride  3 mL Intravenous Q12H  . theophylline  200 mg Oral BID  . vancomycin  250 mg Oral 4 times per day

## 2014-11-25 NOTE — Progress Notes (Signed)
PT Cancellation Note  Patient Details Name: [name] MRN: [id number] DOB: 04-Apr-1929   Cancelled Treatment:    Reason Eval/Treat Not Completed: Patient at procedure or test/unavailable. Will check back as schedule allows to complete PT eval.    Conni SlipperKirkman, Amitai Delaughter 11/25/2014, 9:19 AM   Conni SlipperLaura Umar Patmon, PT, DPT Acute Rehabilitation Services Pager: [phone]

## 2014-11-25 NOTE — Procedures (Signed)
I was present at this session.  I have reviewed the session itself and made appropriate changes.  BP low 100s. Cath flow 400. Ca ^, low Ca bath  [name] L 3/4/20168:52 AM

## 2014-11-26 DIAGNOSIS — R918 Other nonspecific abnormal finding of lung field: Secondary | ICD-10-CM

## 2014-11-26 LAB — CBC
HCT: 36 % (ref 36.0–46.0)
Hemoglobin: 12 g/dL (ref 12.0–15.0)
MCH: 30 pg (ref 26.0–34.0)
MCHC: 33.3 g/dL (ref 30.0–36.0)
MCV: 90 fL (ref 78.0–100.0)
Platelets: 39 10*3/uL — ABNORMAL LOW (ref 150–400)
RBC: 4 MIL/uL (ref 3.87–5.11)
RDW: 17.4 % — AB (ref 11.5–15.5)
WBC: 7.3 10*3/uL (ref 4.0–10.5)

## 2014-11-26 LAB — PROTIME-INR
INR: 1.36 (ref 0.00–1.49)
Prothrombin Time: 16.9 seconds — ABNORMAL HIGH (ref 11.6–15.2)

## 2014-11-26 LAB — APTT: APTT: 35 s (ref 24–37)

## 2014-11-26 LAB — VITAMIN B12: Vitamin B-12: 542 pg/mL (ref 211–911)

## 2014-11-26 MED ORDER — VANCOMYCIN 50 MG/ML ORAL SOLUTION
125.0000 mg | Freq: Four times a day (QID) | ORAL | Status: DC
  Administered 2014-11-27 – 2014-11-30 (×13): 125 mg via ORAL
  Filled 2014-11-26 (×19): qty 2.5

## 2014-11-26 NOTE — Progress Notes (Signed)
Subjective:  No cos ,"better diarrhea"  Objective Vital signs in last 24 hours: Filed Vitals:   11/25/14 1958 11/25/14 2001 11/25/14 2038 11/26/14 0500  BP:   135/47 98/47  Pulse:   92 83  Temp:   97.6 F (36.4 C) 97.5 F (36.4 C)  TempSrc:   Oral Oral  Resp:   18 16  Weight:   48.217 kg (106 lb 4.8 oz)   SpO2: 98% 98% 98% 100%   Physical Exam: General: Alert thin Chronically ill Elderly BF NAD Heart: irregular, With 2/6 SEM apex, no rub Lungs: CTA, with poor resp response and. unlabored Abdomen: soft, mild tenderness Epigastric area and guarding. Sl tender Pulsating Abd mass  Extremities: trace bilat LE edema Dialysis Access: R IJ perm cath  Dialysis Orders: MWF @ [facility] 3:30 160 47.5kgs 3K/2.25ca R IJ cath 3000u heparin 400/1.5 No meds   Assessment/Plan: 1. Lethargy- with Recurrent C. Diff/ head and chest CT no acute infarct/findings  2. Recurrent C Difff. Back On po vanc.  3. Abd pain- abd CT. AAA diagnosed in nov-no evidenece of change 4. UTI- Now off rocephin/ Doubt is source or if even is UTI or colonization 5. ESRD - MWF @ [facility], HD  On schedule  No uf yester sec poor po  6. Hypertension/volume - am bp 98/47  on midodrine  bid , coming in under edw, losing body wt- this am states "ready to eat" 7. Anemia - hgb 12.3 >11.7 no ESA or Fe-  8. Metabolic bone disease - with Hypercalcemia- with Corrected ^ Ca+= 11.1 No vit D. No binders- last pth 21 and phos 5 2/24 / using low ca hd bath  9. Nutrition - alb 2.3 poor appetite, renal vit- on Breeze tid , severe malnutrition 10. Lung mass- found in nov- no significant change since nov 11. afib- amiodarone  12. Thrush on Nystatin  13. EOL issues. Severe FTT  Lenny Pastel, PA-C [facility] [phone] 11/26/2014,8:06 AM  LOS: 3 days  I have seen and examined this patient and agree with the plan of care see my note .  Kaemon Barnett L 11/26/2014, 10:58 AM   Labs: Basic  Metabolic Panel:  Recent Labs Lab 11/23/14 0923 11/24/14 0352 11/25/14 0539  NA 138 136 140  K 4.4 4.8 4.5  CL 103 100 101  CO2  --  18* 17*  GLUCOSE 69* 82 71  BUN 46* 46* 54*  CREATININE 6.00* 7.00* 8.25*  CALCIUM  --  9.5 9.7   Liver Function Tests:  Recent Labs Lab 11/23/14 0905 11/24/14 0352  AST 27 24  ALT 16 14  ALKPHOS 65 66  BILITOT 0.9 0.8  PROT 6.8 6.0  ALBUMIN 2.5* 2.3*   CBC:  Recent Labs Lab 11/23/14 0905 11/23/14 0923 11/24/14 0352 11/25/14 0539  WBC 10.6*  --  10.5 11.2*  NEUTROABS 8.8*  --   --   --   HGB 13.3 17.7* 12.3 11.7*  HCT 42.3 52.0* 38.7 35.8*  MCV 94.6  --  92.6 89.9  PLT 66*  --  68* 61*  CBG:  Recent Labs Lab 11/23/14 0939 11/23/14 1641  GLUCAP 78 84    Studies/Results: No results found. Medications:   . allopurinol  100 mg Oral Daily  . amiodarone  200 mg Oral Daily  . arformoterol  15 mcg Nebulization BID  . budesonide  0.5 mg Nebulization BID  . feeding supplement (RESOURCE BREEZE)  1 Container Oral TID BM  . midodrine  10 mg Oral BID WC  . multivitamin  1 tablet Oral QHS  . nystatin  5 mL Oral QID  . sodium chloride  3 mL Intravenous Q12H  . sodium chloride  3 mL Intravenous Q12H  . theophylline  200 mg Oral BID  . vancomycin  250 mg Oral 4 times per day

## 2014-11-26 NOTE — Progress Notes (Signed)
PROGRESS NOTE  [name] ZOX:[id number] DOB: 1928/12/27 DOA: 11/23/2014 PCP: [name], MD  Assessment/Plan: c diff, relapse Recently admitted for same, completed vancomycin course, then diarrhea returned.  PCR positive.  D/w Dr. Luciana Axe. He recommends vancomycin PO 125 qid x2 weeks, then rifaximin 400 mg bid x2 weeks  Abnormal UA: Culture negative. Ceftriaxone stopped, particularly given continued CDI   Thrombocytopenia, new Secondary to infection? No bleeding. Worse today. Getting heparin with HD. Will check HIT panel, folate, B12. On no other meds which can cause. Monitor.  Odynophagia/suspected oral Candida continue nystatin   ESRD on dialysis Nephrology following   Chronic A-fib Not on anticoagulation   AAA  Increased size compared to 2013. Not a surgical candidate per family. I agree   COPD  stable   Spiculated right Lung mass -Has not increased in size since last imaging November 2015. Doubt a candidate for resection, chemo even if neoplasm.   Protein-calorie malnutrition, severe CDI, thrush contributing  Minimal improvement. Given multiple serious medical problems, FTT, decline in functional status, prognosis appears poor. Also, quality of life worsening.  Dr. Darrick Penna agrees. Discussed with family who voice understanding. They agree to discuss with PMT GOC. Discussed with Dr. Greig Right who will consult  Code Status: full Family Communication: daughter by phone Disposition Plan: back to SNF when improved and/or GOC established   Consultants:  Renal  Palliative care  Procedures:   HPI/Subjective: "not doing well"  Feels sad. Denies pain. Per nursing not eating much. Diarrhea less frequent and less voluminous  Objective: Filed Vitals:   11/26/14 1107  BP: 105/44  Pulse: 93  Temp: 98.2 F (36.8 C)  Resp: 18    Intake/Output Summary (Last 24 hours) at 11/26/14 1238 Last data filed at 11/26/14 1119  Gross per 24 hour  Intake    120 ml    Output      1 ml  Net    119 ml   Filed Weights   11/25/14 0830 11/25/14 1238 11/25/14 2038  Weight: 53.1 kg (117 lb 1 oz) 53.1 kg (117 lb 1 oz) 48.217 kg (106 lb 4.8 oz)    Exam:   General:  Alert. Weak appearing. Uncomfortable and sad affect  HEENT: dry MM. No white plaques  Cardiovascular: rrr without MGR  Respiratory: clear without WRR  Abdomen: soft, NT, ND  Musculoskeletal: no edema   Data Reviewed: Basic Metabolic Panel:  Recent Labs Lab 11/23/14 0923 11/24/14 0352 11/25/14 0539  NA 138 136 140  K 4.4 4.8 4.5  CL 103 100 101  CO2  --  18* 17*  GLUCOSE 69* 82 71  BUN 46* 46* 54*  CREATININE 6.00* 7.00* 8.25*  CALCIUM  --  9.5 9.7   Liver Function Tests:  Recent Labs Lab 11/23/14 0905 11/24/14 0352  AST 27 24  ALT 16 14  ALKPHOS 65 66  BILITOT 0.9 0.8  PROT 6.8 6.0  ALBUMIN 2.5* 2.3*   No results for input(s): LIPASE, AMYLASE in the last 168 hours. No results for input(s): AMMONIA in the last 168 hours. CBC:  Recent Labs Lab 11/23/14 0905 11/23/14 0923 11/24/14 0352 11/25/14 0539 11/26/14 1115  WBC 10.6*  --  10.5 11.2* 7.3  NEUTROABS 8.8*  --   --   --   --   HGB 13.3 17.7* 12.3 11.7* 12.0  HCT 42.3 52.0* 38.7 35.8* 36.0  MCV 94.6  --  92.6 89.9 90.0  PLT 66*  --  68* 61* 39*   Cardiac Enzymes:  No results for input(s): CKTOTAL, CKMB, CKMBINDEX, TROPONINI in the last 168 hours. BNP (last 3 results) No results for input(s): BNP in the last 8760 hours.  ProBNP (last 3 results) No results for input(s): PROBNP in the last 8760 hours.  CBG:  Recent Labs Lab 11/23/14 0939 11/23/14 1641  GLUCAP 78 84    Recent Results (from the past 240 hour(s))  Urine culture     Status: None   Collection Time: 11/23/14  9:45 AM  Result Value Ref Range Status   Specimen Description URINE, CATHETERIZED  Final   Special Requests NONE  Final   Colony Count NO GROWTH Performed at [facility]   Final   Culture NO GROWTH Performed  at [facility]   Final   Report Status 11/25/2014 FINAL  Final  Culture, blood (routine x 2)     Status: None (Preliminary result)   Collection Time: 11/23/14  2:45 PM  Result Value Ref Range Status   Specimen Description BLOOD HAND RIGHT  Final   Special Requests BOTTLES DRAWN AEROBIC AND ANAEROBIC 5CC  Final   Culture   Final           BLOOD CULTURE RECEIVED NO GROWTH TO DATE CULTURE WILL BE HELD FOR 5 DAYS BEFORE ISSUING A FINAL NEGATIVE REPORT Performed at [facility]    Report Status PENDING  Incomplete  Culture, blood (routine x 2)     Status: None (Preliminary result)   Collection Time: 11/23/14  3:00 PM  Result Value Ref Range Status   Specimen Description BLOOD HAND RIGHT  Final   Special Requests BOTTLES DRAWN AEROBIC AND ANAEROBIC 5CC  Final   Culture   Final           BLOOD CULTURE RECEIVED NO GROWTH TO DATE CULTURE WILL BE HELD FOR 5 DAYS BEFORE ISSUING A FINAL NEGATIVE REPORT Performed at [facility]    Report Status PENDING  Incomplete  Clostridium Difficile by PCR     Status: Abnormal   Collection Time: 11/23/14  6:25 PM  Result Value Ref Range Status   C difficile by pcr POSITIVE (A) NEGATIVE Final    Comment: CRITICAL RESULT CALLED TO, READ BACK BY AND VERIFIED WITH: CHANEY,T RN @ 1247 11/24/14 LEONARD,A      Studies: No results found.  Scheduled Meds: . allopurinol  100 mg Oral Daily  . amiodarone  200 mg Oral Daily  . arformoterol  15 mcg Nebulization BID  . budesonide  0.5 mg Nebulization BID  . feeding supplement (RESOURCE BREEZE)  1 Container Oral TID BM  . midodrine  10 mg Oral BID WC  . multivitamin  1 tablet Oral QHS  . nystatin  5 mL Oral QID  . sodium chloride  3 mL Intravenous Q12H  . sodium chloride  3 mL Intravenous Q12H  . theophylline  200 mg Oral BID  . vancomycin  250 mg Oral 4 times per day   Continuous Infusions:  Antibiotics Given (last 72 hours)    Date/Time Action Medication Dose Rate   11/23/14 1818  Given   vancomycin (VANCOCIN) 50 mg/mL oral solution 250 mg 250 mg    11/24/14 0036 Given   vancomycin (VANCOCIN) 50 mg/mL oral solution 250 mg 250 mg    11/24/14 0036 Given   cefTRIAXone (ROCEPHIN) 1 g in dextrose 5 % 50 mL IVPB 1 g 100 mL/hr   11/24/14 1300 Given   vancomycin (VANCOCIN) 50 mg/mL oral solution 250 mg  250 mg    11/24/14 1735 Given   vancomycin (VANCOCIN) 50 mg/mL oral solution 250 mg 250 mg    11/25/14 0025 Given   cefTRIAXone (ROCEPHIN) 1 g in dextrose 5 % 50 mL IVPB 1 g 100 mL/hr   11/25/14 1724 Given   vancomycin (VANCOCIN) 50 mg/mL oral solution 250 mg 250 mg    11/26/14 1220 Given   vancomycin (VANCOCIN) 50 mg/mL oral solution 250 mg 250 mg      Time spent: 25 min  Vonna Brabson L  [facility]  [email or web address], password [facility] 11/26/2014, 12:38 PM  LOS: 3 days

## 2014-11-26 NOTE — Progress Notes (Signed)
Pt refusing any meds or food this evening.

## 2014-11-26 NOTE — Progress Notes (Signed)
Pt refusing to eat or drink anything.  This morning, she made a face when she swallowed anything.  Tongue coated with white patches.  Family at bedside.

## 2014-11-26 NOTE — Progress Notes (Signed)
Subjective: Interval History: has no complaint, D better, but still sore stom.  Objective: Vital signs in last 24 hours: Temp:  [97.5 F (36.4 C)-98.4 F (36.9 C)] 97.5 F (36.4 C) (03/05 0500) Pulse Rate:  [76-107] 83 (03/05 0500) Resp:  [16-34] 16 (03/05 0500) BP: (82-135)/(36-65) 98/47 mmHg (03/05 0500) SpO2:  [94 %-100 %] 100 % (03/05 0500) Weight:  [48.217 kg (106 lb 4.8 oz)-53.1 kg (117 lb 1 oz)] 48.217 kg (106 lb 4.8 oz) (03/04 2038) Weight change: -0.016 kg (-0.6 oz)  Intake/Output from previous day: 03/04 0701 - 03/05 0700 In: 180 [P.O.:180] Out: 1 [Stool:1] Intake/Output this shift:    General appearance: cooperative, cachectic and not verbalilzing but coop, and coherent Resp: diminished breath sounds bilaterally Chest wall: IJ cath Cardio: S1, S2 normal, systolic murmur: holosystolic 2/6, blowing at apex and freq irreg GI: AAA upper abdm , tender, soft Extremities: bilat fem bruits  Lab Results:  Recent Labs  11/24/14 0352 11/25/14 0539  WBC 10.5 11.2*  HGB 12.3 11.7*  HCT 38.7 35.8*  PLT 68* 61*   BMET:  Recent Labs  11/24/14 0352 11/25/14 0539  NA 136 140  K 4.8 4.5  CL 100 101  CO2 18* 17*  GLUCOSE 82 71  BUN 46* 54*  CREATININE 7.00* 8.25*  CALCIUM 9.5 9.7   No results for input(s): PTH in the last 72 hours. Iron Studies: No results for input(s): IRON, TIBC, TRANSFERRIN, FERRITIN in the last 72 hours.  Studies/Results: No results found.  I have reviewed the patient's current medications.  Assessment/Plan: 1 ESRD stable 2 Anemia stable 3 AAA tender, no leak 4 Cdiff on Vanco prolonged course 5 Severe FTT 6HPTH Ca ^, cont low Ca bath P Pall care, HD MWF,  Vanco, PC    LOS: 3 days   Kenetra Hildenbrand L 11/26/2014,8:52 AM

## 2014-11-26 NOTE — Evaluation (Signed)
Physical Therapy Evaluation Patient Details Name: [name] MRN: [id number] DOB: 08/20/1929 Today's Date: 11/26/2014   History of Present Illness  81 yo female with abd pain and leukocytosis had + c-diff culture, thrombocytopenia.  PMHx: ESRD, PEA, VDRF, anocic encephalopathy.  Was just in SNF prior to admission.  Clinical Impression  Pt was seen for evaluation of her current status, and has just come over from SNF placement.  Her plan is to return there and explore her ability to recover to get home.  Her family is in including her daughter who is caregiver.  HHPT to follow SNF.    Follow Up Recommendations SNF;Supervision/Assistance - 24 hour    Equipment Recommendations  None recommended by PT    Recommendations for Other Services       Precautions / Restrictions Precautions Precautions: Fall Restrictions Weight Bearing Restrictions: No      Mobility  Bed Mobility Overal bed mobility: Needs Assistance Bed Mobility: Supine to Sit     Supine to sit: Max assist        Transfers Overall transfer level: Needs assistance Equipment used: None (refused to attempt)             General transfer comment: Was in need of assist at baseline before SNF admision  Ambulation/Gait             General Gait Details: unable for > 6 months  Stairs            Wheelchair Mobility    Modified Rankin (Stroke Patients Only)       Balance Overall balance assessment: Needs assistance Sitting-balance support: Feet supported;Bilateral upper extremity supported Sitting balance-Leahy Scale: Poor Sitting balance - Comments: Fatigued and used L elbow for support                                     Pertinent Vitals/Pain Pain Assessment: No/denies pain    Home Living Family/patient expects to be discharged to:: Skilled nursing facility                      Prior Function Level of Independence: Needs assistance   Gait / Transfers Assistance  Needed: Non ambulatory for > 6 months  ADL's / Homemaking Assistance Needed: care from family at home        Hand Dominance        Extremity/Trunk Assessment   Upper Extremity Assessment: Generalized weakness           Lower Extremity Assessment: Generalized weakness      Cervical / Trunk Assessment: Kyphotic  Communication   Communication: Prefers language other than English  Cognition Arousal/Alertness: Lethargic Behavior During Therapy: Anxious Overall Cognitive Status: History of cognitive impairments - at baseline       Memory: Decreased recall of precautions;Decreased short-term memory              General Comments General comments (skin integrity, edema, etc.): Pt is getting tired with any effort due to her PLOF being so low and now is more debilitated    Exercises        Assessment/Plan    PT Assessment Patient needs continued PT services  PT Diagnosis Difficulty walking;Generalized weakness   PT Problem List Decreased strength;Decreased range of motion;Decreased activity tolerance;Decreased balance;Decreased mobility;Decreased coordination;Decreased cognition;Decreased knowledge of use of DME;Decreased safety awareness;Decreased knowledge of precautions;Cardiopulmonary status limiting activity  PT Treatment Interventions DME  instruction;Gait training;Functional mobility training;Therapeutic activities;Therapeutic exercise;Balance training;Neuromuscular re-education;Cognitive remediation;Patient/family education   PT Goals (Current goals can be found in the Care Plan section) Acute Rehab PT Goals Patient Stated Goal: did not state PT Goal Formulation: With patient/family Time For Goal Achievement: 12/10/14 Potential to Achieve Goals: Fair    Frequency Min 3X/week   Barriers to discharge Other (comment) (PLOF was higher than current with one caregiver athome )      Co-evaluation               End of Session Equipment Utilized During  Treatment: Oxygen Activity Tolerance: Patient limited by fatigue;Patient limited by lethargy Patient left: in bed;with call bell/phone within reach;with bed alarm set;with nursing/sitter in room;with family/visitor present Nurse Communication: Mobility status         Time: [id number] PT Time Calculation (min) (ACUTE ONLY): 30 min   Charges:   PT Evaluation $Initial PT Evaluation Tier I: 1 Procedure PT Treatments $Therapeutic Activity: 8-22 mins   PT G Codes:        Ivar DrapeStout, Laelyn E 11/26/2014, 2:32 PM   Samul Dadauth Taiwana Willison, PT MS Acute Rehab Dept. Number: [phone]

## 2014-11-27 DIAGNOSIS — Z515 Encounter for palliative care: Secondary | ICD-10-CM | POA: Insufficient documentation

## 2014-11-27 DIAGNOSIS — R630 Anorexia: Secondary | ICD-10-CM | POA: Insufficient documentation

## 2014-11-27 LAB — CBC
HCT: 35.4 % — ABNORMAL LOW (ref 36.0–46.0)
Hemoglobin: 11.4 g/dL — ABNORMAL LOW (ref 12.0–15.0)
MCH: 29.1 pg (ref 26.0–34.0)
MCHC: 32.2 g/dL (ref 30.0–36.0)
MCV: 90.3 fL (ref 78.0–100.0)
Platelets: 60 10*3/uL — ABNORMAL LOW (ref 150–400)
RBC: 3.92 MIL/uL (ref 3.87–5.11)
RDW: 17.4 % — ABNORMAL HIGH (ref 11.5–15.5)
WBC: 5.8 10*3/uL (ref 4.0–10.5)

## 2014-11-27 NOTE — Progress Notes (Signed)
PROGRESS NOTE  [name] ZOX:[id number] DOB: [birth year] DOA: 11/23/2014 PCP: [name], MD  Assessment/Plan: c diff, relapse Recently admitted for same, completed vancomycin course, then diarrhea returned.  PCR positive.  Dr. Lendell Caprice D/w Dr. Luciana Axe. He recommends vancomycin PO 125 qid x2 weeks, then rifaximin 400 mg bid x2 weeks  Abnormal UA: Culture negative. Ceftriaxone stopped, particularly given continued CDI   Thrombocytopenia, new Secondary to infection? No bleeding.  - Getting heparin with HD. Will check HIT panel, folate, B12 ok  -improved today  Odynophagia/suspected oral Candida continue nystatin   ESRD on dialysis Nephrology following   Chronic A-fib Not on anticoagulation   AAA  Increased size compared to 2013. Not a surgical candidate per family. I agree   COPD  stable   Spiculated right Lung mass -Has not increased in size since last imaging November 2015. Doubt a candidate for resection, chemo even if neoplasm.   Protein-calorie malnutrition, severe CDI, thrush contributing  Minimal improvement. Given multiple serious medical problems, FTT, decline in functional status, prognosis appears poor. Also, quality of life worsening.  Dr. Darrick Penna agrees. Dr. Lendell Caprice discussed with family who voice understanding. They agree to discuss with PMT GOC.   Code Status: full Family Communication: granddaughter at bedside Disposition Plan: back to SNF when improved and/or GOC established   Consultants:  Renal  Palliative care  Procedures:   HPI/Subjective: Refused meds and eating last PM  Objective: Filed Vitals:   11/27/14 0844  BP: 100/44  Pulse: 88  Temp: 97.8 F (36.6 C)  Resp: 16    Intake/Output Summary (Last 24 hours) at 11/27/14 0910 Last data filed at 11/27/14 0845  Gross per 24 hour  Intake      0 ml  Output      0 ml  Net      0 ml   Filed Weights   11/25/14 1238 11/25/14 2038 11/26/14 2118  Weight: 53.1 kg (117 lb 1  oz) 48.217 kg (106 lb 4.8 oz) 48.4 kg (106 lb 11.2 oz)    Exam:   General:    HEENT: dry MM. No white plaques  Cardiovascular: rrr without MGR  Respiratory: clear without WRR  Abdomen: soft, NT, ND  Musculoskeletal: no edema   Data Reviewed: Basic Metabolic Panel:  Recent Labs Lab 11/23/14 0923 11/24/14 0352 11/25/14 0539  NA 138 136 140  K 4.4 4.8 4.5  CL 103 100 101  CO2  --  18* 17*  GLUCOSE 69* 82 71  BUN 46* 46* 54*  CREATININE 6.00* 7.00* 8.25*  CALCIUM  --  9.5 9.7   Liver Function Tests:  Recent Labs Lab 11/23/14 0905 11/24/14 0352  AST 27 24  ALT 16 14  ALKPHOS 65 66  BILITOT 0.9 0.8  PROT 6.8 6.0  ALBUMIN 2.5* 2.3*   No results for input(s): LIPASE, AMYLASE in the last 168 hours. No results for input(s): AMMONIA in the last 168 hours. CBC:  Recent Labs Lab 11/23/14 0905 11/23/14 0923 11/24/14 0352 11/25/14 0539 11/26/14 1115 11/27/14 0549  WBC 10.6*  --  10.5 11.2* 7.3 5.8  NEUTROABS 8.8*  --   --   --   --   --   HGB 13.3 17.7* 12.3 11.7* 12.0 11.4*  HCT 42.3 52.0* 38.7 35.8* 36.0 35.4*  MCV 94.6  --  92.6 89.9 90.0 90.3  PLT 66*  --  68* 61* 39* 60*   Cardiac Enzymes: No results for input(s): CKTOTAL, CKMB, CKMBINDEX, TROPONINI in the last 168  hours. BNP (last 3 results) No results for input(s): BNP in the last 8760 hours.  ProBNP (last 3 results) No results for input(s): PROBNP in the last 8760 hours.  CBG:  Recent Labs Lab 11/23/14 0939 11/23/14 1641  GLUCAP 78 84    Recent Results (from the past 240 hour(s))  Urine culture     Status: None   Collection Time: 11/23/14  9:45 AM  Result Value Ref Range Status   Specimen Description URINE, CATHETERIZED  Final   Special Requests NONE  Final   Colony Count NO GROWTH Performed at [facility]   Final   Culture NO GROWTH Performed at [facility]   Final   Report Status 11/25/2014 FINAL  Final  Culture, blood (routine x 2)     Status: None  (Preliminary result)   Collection Time: 11/23/14  2:45 PM  Result Value Ref Range Status   Specimen Description BLOOD HAND RIGHT  Final   Special Requests BOTTLES DRAWN AEROBIC AND ANAEROBIC 5CC  Final   Culture   Final           BLOOD CULTURE RECEIVED NO GROWTH TO DATE CULTURE WILL BE HELD FOR 5 DAYS BEFORE ISSUING A FINAL NEGATIVE REPORT Performed at [facility]    Report Status PENDING  Incomplete  Culture, blood (routine x 2)     Status: None (Preliminary result)   Collection Time: 11/23/14  3:00 PM  Result Value Ref Range Status   Specimen Description BLOOD HAND RIGHT  Final   Special Requests BOTTLES DRAWN AEROBIC AND ANAEROBIC 5CC  Final   Culture   Final           BLOOD CULTURE RECEIVED NO GROWTH TO DATE CULTURE WILL BE HELD FOR 5 DAYS BEFORE ISSUING A FINAL NEGATIVE REPORT Performed at [facility]    Report Status PENDING  Incomplete  Clostridium Difficile by PCR     Status: Abnormal   Collection Time: 11/23/14  6:25 PM  Result Value Ref Range Status   C difficile by pcr POSITIVE (A) NEGATIVE Final    Comment: CRITICAL RESULT CALLED TO, READ BACK BY AND VERIFIED WITH: CHANEY,T RN @ 1247 11/24/14 LEONARD,A      Studies: No results found.  Scheduled Meds: . allopurinol  100 mg Oral Daily  . amiodarone  200 mg Oral Daily  . arformoterol  15 mcg Nebulization BID  . budesonide  0.5 mg Nebulization BID  . feeding supplement (RESOURCE BREEZE)  1 Container Oral TID BM  . midodrine  10 mg Oral BID WC  . multivitamin  1 tablet Oral QHS  . nystatin  5 mL Oral QID  . sodium chloride  3 mL Intravenous Q12H  . sodium chloride  3 mL Intravenous Q12H  . theophylline  200 mg Oral BID  . vancomycin  125 mg Oral 4 times per day   Continuous Infusions:  Antibiotics Given (last 72 hours)    Date/Time Action Medication Dose Rate   11/24/14 1300 Given   vancomycin (VANCOCIN) 50 mg/mL oral solution 250 mg 250 mg    11/24/14 1735 Given   vancomycin (VANCOCIN) 50  mg/mL oral solution 250 mg 250 mg    11/25/14 0025 Given   cefTRIAXone (ROCEPHIN) 1 g in dextrose 5 % 50 mL IVPB 1 g 100 mL/hr   11/25/14 1724 Given   vancomycin (VANCOCIN) 50 mg/mL oral solution 250 mg 250 mg    11/26/14 1220 Given   vancomycin (VANCOCIN)  50 mg/mL oral solution 250 mg 250 mg    11/27/14 0024 Given   vancomycin (VANCOCIN) 50 mg/mL oral solution 125 mg 125 mg    11/27/14 1610 Given   vancomycin (VANCOCIN) 50 mg/mL oral solution 125 mg 125 mg      Time spent: 25 min  Miloh Alcocer  [facility]  [email or web address], password [facility] 11/27/2014, 9:10 AM  LOS: 4 days

## 2014-11-27 NOTE — Consult Note (Signed)
Patient [name]      DOB: 04/14/1929      RCV:[id number]     Consult Note from the Palliative Medicine Team at [facility] Requested by: Dr Conley Canal     PCP: [name], MD Reason for [place]     Phone Number:None  Assessment/Recommendations: 81 yo female with PMHx of ESRD, c-diff colitis, COPD, afib, AAA, RLL lung mass who presented with lethargy and hypotension from SNF  1.  Code Status: Full, I did not discuss  2.  GOC: Met today with patient who is difficult to engage in conversation. Certainly both her clinical and functional trajectory sound very worrisome. Last night she refused to eat and take medications. She refused to eat again today.  Agree with other provider assessments that her prognosis is seemingly very poor.  I spoke today with her sisters who are seeing her for first time today in quite some time.  I also spoke with her daughter carrie extensively today. I don't think [name] has great idea of how things have been going. Was previously living independently prior to last 2 hospitalizations but declining and at [facility] or hospital since January.  Talked about some of health issues that are likely to be driving her FTT.  [name] does ask about things like IV nutrition which I recommended against.  Daughter [name] is reportedly main Media planner per [employer]. I called her today but unable to reach.  I have left my call back number and will also try her again tomorrow.   I will fu tomorrow  3. Symptom Management:   1. Loss of Appetite: I am not hopeful that any medications will help this. Stimulants with most data are steroids/megace but she isn't taking pills and I worry about side effect profile for them.  Could try mirtazapine ODT but doubt this makes significant impact.   4. Psychosocial/Spiritual: used to enjoy going to church. Lives independently in [place], [place] prior to SNF. 2 daughters [name] and [name].   Brief HPI:  81 yo female with PMHx  of ESRD, COPD, Afib, C-diff colitis (recent admission and d/c 11/01/14), AAA, RLL lung mass 2.1cm who was admitted from SNF with complaints of lethargy and hypotension while at dialysis session. There is reported history of fatigue, decreased appetite, lower abdominal pain in the few weeks prior.  She was admitted here and treated for C-diff and potential UTI.  Urine culture not showing bacteria and UTI treatment stopped. Continues to receive HD.  Physicians here have noted poor response to treatment with continued functional and clinical decline or lack of improvement. Given concerns related to poor QOL and severe FTT, palliative care consulted. She reportedly has refused meds and food overnight.   In speaking with [name] today, she is withdrawn and difficult to engage in conversation. She initially thought she was home and not at hospital. I am unable to draw out any specific complaints from her. When I ask about why she is not eating, she reports not being hungry. Denies F/C, N/V, constipation, pain, depression.  Asked her family for hot dog today which they got but she would not eat.      PMH:  Past Medical History  Diagnosis Date  . Hypertension   . Hyperlipidemia   . COPD (chronic obstructive pulmonary disease)   . Anemia   . ESRD (end stage renal disease) on dialysis   . CHF (congestive heart failure)   . GERD (gastroesophageal reflux disease)   . Constipation   .  PEA (Pulseless electrical activity) 04/19/2012    PEA arrest complicated by hyperkalemia, PNA, anocic encephalopathy, VDRF     PSH: Past Surgical History  Procedure Laterality Date  . Abdominal hysterectomy    . Arteriovenous fistula  06/02/12    left radiocephalic - failed  . Arteriovenous graft placement  07/14/12    Created at [facility]   I have reviewed the FH and SH and  If appropriate update it with new information. No Known Allergies Scheduled Meds: . allopurinol  100 mg Oral Daily  . amiodarone  200 mg Oral Daily   . arformoterol  15 mcg Nebulization BID  . budesonide  0.5 mg Nebulization BID  . feeding supplement (RESOURCE BREEZE)  1 Container Oral TID BM  . midodrine  10 mg Oral BID WC  . multivitamin  1 tablet Oral QHS  . nystatin  5 mL Oral QID  . sodium chloride  3 mL Intravenous Q12H  . theophylline  200 mg Oral BID  . vancomycin  125 mg Oral 4 times per day   Continuous Infusions:  PRN Meds:.sodium chloride, acetaminophen **OR** acetaminophen, albuterol, oxyCODONE, sodium chloride    BP 100/44 mmHg  Pulse 88  Temp(Src) 97.8 F (36.6 C) (Oral)  Resp 16  Wt 48.4 kg (106 lb 11.2 oz)  SpO2 98%   PPS: 20   Intake/Output Summary (Last 24 hours) at 11/27/14 1507 Last data filed at 11/27/14 1403  Gross per 24 hour  Intake      0 ml  Output      1 ml  Net     -1 ml    Physical Exam:  General: Awake, but slightly lethargic HEENT:  [place], sclera anicteric Chest:  symm expn CVS: regular rate Abdomen: soft, ND Ext: warm Psych: withdrawn, does not fully engage in conversation  Labs: CBC    Component Value Date/Time   WBC 5.8 11/27/2014 0549   RBC 3.92 11/27/2014 0549   HGB 11.4* 11/27/2014 0549   HCT 35.4* 11/27/2014 0549   PLT 60* 11/27/2014 0549   MCV 90.3 11/27/2014 0549   MCH 29.1 11/27/2014 0549   MCHC 32.2 11/27/2014 0549   RDW 17.4* 11/27/2014 0549   LYMPHSABS 0.9 11/23/2014 0905   MONOABS 0.9 11/23/2014 0905   EOSABS 0.0 11/23/2014 0905   BASOSABS 0.0 11/23/2014 0905    BMET    Component Value Date/Time   NA 140 11/25/2014 0539   K 4.5 11/25/2014 0539   CL 101 11/25/2014 0539   CO2 17* 11/25/2014 0539   GLUCOSE 71 11/25/2014 0539   BUN 54* 11/25/2014 0539   CREATININE 8.25* 11/25/2014 0539   CREATININE 1.69* 09/26/2011 1129   CALCIUM 9.7 11/25/2014 0539   GFRNONAA 4* 11/25/2014 0539   GFRAA 5* 11/25/2014 0539    CMP     Component Value Date/Time   NA 140 11/25/2014 0539   K 4.5 11/25/2014 0539   CL 101 11/25/2014 0539   CO2 17* 11/25/2014 0539    GLUCOSE 71 11/25/2014 0539   BUN 54* 11/25/2014 0539   CREATININE 8.25* 11/25/2014 0539   CREATININE 1.69* 09/26/2011 1129   CALCIUM 9.7 11/25/2014 0539   PROT 6.0 11/24/2014 0352   ALBUMIN 2.3* 11/24/2014 0352   AST 24 11/24/2014 0352   ALT 14 11/24/2014 0352   ALKPHOS 66 11/24/2014 0352   BILITOT 0.8 11/24/2014 0352   GFRNONAA 4* 11/25/2014 0539   GFRAA 5* 11/25/2014 0539   3/2 CT Head IMPRESSION: Chronic changes as described.  Similar appearance compared with November 2015.  3/2 CTA Chest/Abd/Pelvis IMPRESSION: Complex thoracoabdominal aortic aneurysm with marked tortuosity in the lower chest. Extensive mural plaque in the lower chest and abdominal component. No evidence of significant change since most recent studies. No evidence of dissection.  2.1 cm spiculated nodule in the right lower lobe, not significantly changed in overall size since 08/02/2014.  Cardiomegaly, coronary artery disease.  Cholelithiasis.  No acute findings in the chest, abdomen or pelvis.  Total Time: 55 minutes Greater than 50%  of this time was spent counseling and coordinating care related to the above assessment and plan.   Doran Clay D.O. Palliative Medicine Team at [facility]  Pager: [phone] Team Phone: [phone]

## 2014-11-27 NOTE — Progress Notes (Signed)
Subjective:  Decreased appetite, Granddaughter in room "she took some small bites for me brk." she agrres to [facility] in AM  Objective Vital signs in last 24 hours: Filed Vitals:   11/26/14 1720 11/26/14 2056 11/26/14 2118 11/27/14 0551  BP: 106/44  100/44 98/53  Pulse: 86  91 89  Temp: 97.7 F (36.5 C)  98 F (36.7 C) 97.5 F (36.4 C)  TempSrc: Oral  Axillary Axillary  Resp: Weight:   48.4 kg (106 lb 11.2 oz)   SpO2: 100% 97% 100% 91%   Physical Exam: General: Alert thin Chronically ill Elderly BF NAD Heart: irregular, With 2/6 SEM apex, no rub Lungs: CTA, with poor resp response and. unlabored Abdomen: soft, tenderness Epigastric area and guarding. Sl tender Pulsating Abd mass 7-8 cm , tender Extremities: trace bilat LE edema Dialysis Access: R IJ perm cath  Dialysis Orders: MWF @ [facility] 3:30 160 47.5kgs 3K/2.25ca R IJ cath 3000u heparin 400/1.5 No meds   Assessment/Plan: 1. Lethargy- Almost Baseline now  with Recurrent C. Diff/ head and chest CT no acute infarct/findings / noted Admit team plans Goals of care meeting with Family/ Pt lives in NH  2. Recurrent C Difff. Back On po vanc. Admit dw ID recommended 3. Abd pain- abd CT. AAA diagnosed in nov-no evidenece of change 4. UTI- Now off rocephin/ Doubt a source  5. ESRD - MWF @ [facility], HD On schedule / use [facility] as dw pt and grandaughter 6. Hypertension/volume - am bp 98/43 on midodrine  bid ^ tid ,losing body wt- / BEDS WTS not accurate 7. Anemia - hgb 12.3 >11.7>11.4 no ESA or Fe-  8. Metabolic bone disease - with Hypercalcemia- with Corrected ^ Ca+= 11.1 No vit D. No binders- last pth 21 and phos 5 2/24 / using low ca hd bath  9. Nutrition - alb 2.3 poor appetite, renal vit- on Breeze tid , severe malnutrition 10. Lung mass- found in nov- no significant change since nov 11. afib- amiodarone  12. Thrush on Nystatin  13. EOL issues. Severe FTT   Lenny Pastel, PA-C [facility]  [facility] [phone] 11/27/2014,8:31 AM  LOS: 4 days  I have seen and examined this patient and agree with the plan of care seen, examined, eval, discussed with staff and family.  Outlook not good even short term .  Jullie Arps L 11/27/2014, 10:57 AM   Labs: Basic Metabolic Panel:  Recent Labs Lab 11/23/14 0923 11/24/14 0352 11/25/14 0539  NA 138 136 140  K 4.4 4.8 4.5  CL 103 100 101  CO2  --  18* 17*  GLUCOSE 69* 82 71  BUN 46* 46* 54*  CREATININE 6.00* 7.00* 8.25*  CALCIUM  --  9.5 9.7   Liver Function Tests:  Recent Labs Lab 11/23/14 0905 11/24/14 0352  AST 27 24  ALT 16 14  ALKPHOS 65 66  BILITOT 0.9 0.8  PROT 6.8 6.0  ALBUMIN 2.5* 2.3*    CBC:  Recent Labs Lab 11/23/14 0905  11/24/14 0352 11/25/14 0539 11/26/14 1115 11/27/14 0549  WBC 10.6*  --  10.5 11.2* 7.3 5.8  NEUTROABS 8.8*  --   --   --   --   --   HGB 13.3  < > 12.3 11.7* 12.0 11.4*  HCT 42.3  < > 38.7 35.8* 36.0 35.4*  MCV 94.6  --  92.6 89.9 90.0 90.3  PLT 66*  --  68* 61* 39* 60*  < > =  values in this interval not displayed. Cardiac Enzymes: No results for input(s): CKTOTAL, CKMB, CKMBINDEX, TROPONINI in the last 168 hours. CBG:  Recent Labs Lab 11/23/14 0939 11/23/14 1641  GLUCAP 78 84    Studies/Results: No results found. Medications:   . allopurinol  100 mg Oral Daily  . amiodarone  200 mg Oral Daily  . arformoterol  15 mcg Nebulization BID  . budesonide  0.5 mg Nebulization BID  . feeding supplement (RESOURCE BREEZE)  1 Container Oral TID BM  . midodrine  10 mg Oral BID WC  . multivitamin  1 tablet Oral QHS  . nystatin  5 mL Oral QID  . sodium chloride  3 mL Intravenous Q12H  . sodium chloride  3 mL Intravenous Q12H  . theophylline  200 mg Oral BID  . vancomycin  125 mg Oral 4 times per day

## 2014-11-28 DIAGNOSIS — R410 Disorientation, unspecified: Secondary | ICD-10-CM | POA: Insufficient documentation

## 2014-11-28 LAB — CBC
HEMATOCRIT: 32.8 % — AB (ref 36.0–46.0)
Hemoglobin: 10.5 g/dL — ABNORMAL LOW (ref 12.0–15.0)
MCH: 29.2 pg (ref 26.0–34.0)
MCHC: 32 g/dL (ref 30.0–36.0)
MCV: 91.4 fL (ref 78.0–100.0)
Platelets: 65 10*3/uL — ABNORMAL LOW (ref 150–400)
RBC: 3.59 MIL/uL — ABNORMAL LOW (ref 3.87–5.11)
RDW: 17.4 % — ABNORMAL HIGH (ref 11.5–15.5)
WBC: 5.5 10*3/uL (ref 4.0–10.5)

## 2014-11-28 LAB — RENAL FUNCTION PANEL
Albumin: 2 g/dL — ABNORMAL LOW (ref 3.5–5.2)
Anion gap: 10 (ref 5–15)
BUN: 43 mg/dL — AB (ref 6–23)
CHLORIDE: 98 mmol/L (ref 96–112)
CO2: 29 mmol/L (ref 19–32)
Calcium: 8.3 mg/dL — ABNORMAL LOW (ref 8.4–10.5)
Creatinine, Ser: 6.79 mg/dL — ABNORMAL HIGH (ref 0.50–1.10)
GFR calc non Af Amer: 5 mL/min — ABNORMAL LOW (ref >90)
GFR, EST AFRICAN AMERICAN: 6 mL/min — AB (ref >90)
GLUCOSE: 88 mg/dL (ref 70–99)
POTASSIUM: 3.2 mmol/L — AB (ref 3.5–5.1)
Phosphorus: 6.2 mg/dL — ABNORMAL HIGH (ref 2.3–4.6)
Sodium: 137 mmol/L (ref 135–145)

## 2014-11-28 LAB — FOLATE RBC: HEMATOCRIT: 37.2 % (ref 34.0–46.6)

## 2014-11-28 LAB — HEPARIN INDUCED THROMBOCYTOPENIA PNL

## 2014-11-28 MED ORDER — BOOST / RESOURCE BREEZE PO LIQD
1.0000 | Freq: Every day | ORAL | Status: DC
  Administered 2014-11-29 – 2014-11-30 (×2): 1 via ORAL

## 2014-11-28 MED ORDER — NEPRO/CARBSTEADY PO LIQD
237.0000 mL | Freq: Every day | ORAL | Status: DC
  Administered 2014-11-29 – 2014-11-30 (×2): 237 mL via ORAL

## 2014-11-28 MED ORDER — ENSURE COMPLETE PO LIQD
237.0000 mL | Freq: Every day | ORAL | Status: DC
  Administered 2014-11-29 – 2014-11-30 (×2): 237 mL via ORAL

## 2014-11-28 NOTE — Progress Notes (Signed)
PROGRESS NOTE  [name] ZOX:[id number] DOB: 05-23-1929 DOA: 11/23/2014 PCP: [name], MD  Assessment/Plan: c diff, relapse Recently admitted for same, completed vancomycin course, then diarrhea returned.  PCR positive.  Dr. Lendell Caprice D/w Dr. Luciana Axe. He recommends vancomycin PO 125 qid x2 weeks, then rifaximin 400 mg bid x2 weeks  Abnormal UA: Culture negative. Ceftriaxone stopped, particularly given continued CDI   Thrombocytopenia, new Secondary to infection? No bleeding.  - Getting heparin with HD. Will check HIT panel, folate, B12 ok  -improved today  Odynophagia/suspected oral Candida continue nystatin   ESRD on dialysis Nephrology following   Chronic A-fib Not on anticoagulation   AAA  Increased size compared to 2013. Not a surgical candidate per family. I agree   COPD  stable   Spiculated right Lung mass -Has not increased in size since last imaging November 2015. Doubt a candidate for resection, chemo even if neoplasm.   Protein-calorie malnutrition, severe CDI, thrush contributing  Minimal improvement. Given multiple serious medical problems, FTT, decline in functional status, prognosis appears poor. Also, quality of life worsening.  Dr. Darrick Penna agrees. Dr. Lendell Caprice discussed with family who voice understanding. They agree to discuss with PMT GOC.   Code Status: DNR Family Communication: no family at bedside Disposition Plan: back to SNF when improved and/or GOC established   Consultants:  Renal  Palliative care  Procedures:   HPI/Subjective: Only taking in a few bites  Objective: Filed Vitals:   11/28/14 0833  BP: 94/40  Pulse: 87  Temp: 98.1 F (36.7 C)  Resp: 17    Intake/Output Summary (Last 24 hours) at 11/28/14 1148 Last data filed at 11/28/14 0939  Gross per 24 hour  Intake    240 ml  Output      3 ml  Net    237 ml   Filed Weights   11/25/14 1238 11/25/14 2038 11/26/14 2118  Weight: 53.1 kg (117 lb 1 oz) 48.217  kg (106 lb 4.8 oz) 48.4 kg (106 lb 11.2 oz)    Exam:   General:  Up in chair  Cardiovascular: rrr without MGR  Respiratory: clear without WRR  Abdomen: soft, NT, ND  Musculoskeletal: no edema   Data Reviewed: Basic Metabolic Panel:  Recent Labs Lab 11/23/14 0923 11/24/14 0352 11/25/14 0539  NA 138 136 140  K 4.4 4.8 4.5  CL 103 100 101  CO2  --  18* 17*  GLUCOSE 69* 82 71  BUN 46* 46* 54*  CREATININE 6.00* 7.00* 8.25*  CALCIUM  --  9.5 9.7   Liver Function Tests:  Recent Labs Lab 11/23/14 0905 11/24/14 0352  AST 27 24  ALT 16 14  ALKPHOS 65 66  BILITOT 0.9 0.8  PROT 6.8 6.0  ALBUMIN 2.5* 2.3*   No results for input(s): LIPASE, AMYLASE in the last 168 hours. No results for input(s): AMMONIA in the last 168 hours. CBC:  Recent Labs Lab 11/23/14 0905 11/23/14 0923 11/24/14 0352 11/25/14 0539 11/26/14 1115 11/27/14 0549  WBC 10.6*  --  10.5 11.2* 7.3 5.8  NEUTROABS 8.8*  --   --   --   --   --   HGB 13.3 17.7* 12.3 11.7* 12.0 11.4*  HCT 42.3 52.0* 38.7 35.8* 36.0 35.4*  MCV 94.6  --  92.6 89.9 90.0 90.3  PLT 66*  --  68* 61* 39* 60*   Cardiac Enzymes: No results for input(s): CKTOTAL, CKMB, CKMBINDEX, TROPONINI in the last 168 hours. BNP (last 3 results) No results for  input(s): BNP in the last 8760 hours.  ProBNP (last 3 results) No results for input(s): PROBNP in the last 8760 hours.  CBG:  Recent Labs Lab 11/23/14 0939 11/23/14 1641  GLUCAP 78 84    Recent Results (from the past 240 hour(s))  Urine culture     Status: None   Collection Time: 11/23/14  9:45 AM  Result Value Ref Range Status   Specimen Description URINE, CATHETERIZED  Final   Special Requests NONE  Final   Colony Count NO GROWTH Performed at [facility]   Final   Culture NO GROWTH Performed at [facility]   Final   Report Status 11/25/2014 FINAL  Final  Culture, blood (routine x 2)     Status: None (Preliminary result)   Collection Time:  11/23/14  2:45 PM  Result Value Ref Range Status   Specimen Description BLOOD HAND RIGHT  Final   Special Requests BOTTLES DRAWN AEROBIC AND ANAEROBIC 5CC  Final   Culture   Final           BLOOD CULTURE RECEIVED NO GROWTH TO DATE CULTURE WILL BE HELD FOR 5 DAYS BEFORE ISSUING A FINAL NEGATIVE REPORT Performed at [facility]    Report Status PENDING  Incomplete  Culture, blood (routine x 2)     Status: None (Preliminary result)   Collection Time: 11/23/14  3:00 PM  Result Value Ref Range Status   Specimen Description BLOOD HAND RIGHT  Final   Special Requests BOTTLES DRAWN AEROBIC AND ANAEROBIC 5CC  Final   Culture   Final           BLOOD CULTURE RECEIVED NO GROWTH TO DATE CULTURE WILL BE HELD FOR 5 DAYS BEFORE ISSUING A FINAL NEGATIVE REPORT Performed at [facility]    Report Status PENDING  Incomplete  Clostridium Difficile by PCR     Status: Abnormal   Collection Time: 11/23/14  6:25 PM  Result Value Ref Range Status   C difficile by pcr POSITIVE (A) NEGATIVE Final    Comment: CRITICAL RESULT CALLED TO, READ BACK BY AND VERIFIED WITH: CHANEY,T RN @ 1247 11/24/14 LEONARD,A      Studies: No results found.  Scheduled Meds: . allopurinol  100 mg Oral Daily  . amiodarone  200 mg Oral Daily  . arformoterol  15 mcg Nebulization BID  . budesonide  0.5 mg Nebulization BID  . feeding supplement (RESOURCE BREEZE)  1 Container Oral TID BM  . midodrine  10 mg Oral BID WC  . multivitamin  1 tablet Oral QHS  . nystatin  5 mL Oral QID  . sodium chloride  3 mL Intravenous Q12H  . theophylline  200 mg Oral BID  . vancomycin  125 mg Oral 4 times per day   Continuous Infusions:  Antibiotics Given (last 72 hours)    Date/Time Action Medication Dose   11/25/14 1724 Given   vancomycin (VANCOCIN) 50 mg/mL oral solution 250 mg 250 mg   11/26/14 1220 Given   vancomycin (VANCOCIN) 50 mg/mL oral solution 250 mg 250 mg   11/27/14 0024 Given   vancomycin (VANCOCIN) 50 mg/mL  oral solution 125 mg 125 mg   11/27/14 0642 Given   vancomycin (VANCOCIN) 50 mg/mL oral solution 125 mg 125 mg   11/27/14 1413 Given   vancomycin (VANCOCIN) 50 mg/mL oral solution 125 mg 125 mg   11/27/14 1804 Given   vancomycin (VANCOCIN) 50 mg/mL oral solution 125 mg 125 mg  11/27/14 2227 Given   vancomycin (VANCOCIN) 50 mg/mL oral solution 125 mg 125 mg   11/28/14 1610 Given   vancomycin (VANCOCIN) 50 mg/mL oral solution 125 mg 125 mg   11/28/14 1140 Given   vancomycin (VANCOCIN) 50 mg/mL oral solution 125 mg 125 mg     Time spent: 25 min  Apollos Tenbrink  [facility]  [email or web address], password [facility] 11/28/2014, 11:48 AM  LOS: 5 days

## 2014-11-28 NOTE — Progress Notes (Signed)
Physical Therapy Treatment Patient Details Name: [name] MRN: [id number] DOB: 04-12-1929 Today's Date: 11/28/2014    History of Present Illness 81 yo female with abd pain and leukocytosis had + c-diff culture, thrombocytopenia.  PMHx: ESRD, PEA, VDRF, anocic encephalopathy.  Was just in SNF prior to admission.    PT Comments    Pt progressing slowly towards physical therapy goals. +2 total assist was required for squat-pivot transfer bed>chair. Pt reports increased back pain at beginning of session which she states is improved after repositioning to the chair. Pillows placed under hips, behind back, and on each side of pt in chair for support due to poor trunk control.   Follow Up Recommendations  SNF;Supervision/Assistance - 24 hour     Equipment Recommendations  None recommended by PT    Recommendations for Other Services       Precautions / Restrictions Precautions Precautions: Fall Restrictions Weight Bearing Restrictions: No    Mobility  Bed Mobility Overal bed mobility: Needs Assistance Bed Mobility: Rolling;Sidelying to Sit Rolling: Min assist Sidelying to sit: Max assist       General bed mobility comments: Hand-over-hand support provided for pt to grab bed rail. Pt able to roll with min assist to S/L. Max assist required for pt to achieve full sitting at EOB.   Transfers Overall transfer level: Needs assistance Equipment used: 2 person hand held assist Transfers: Squat Pivot Transfers     Squat pivot transfers: Total assist;+2 physical assistance;From elevated surface     General transfer comment: Pt unable to attempt OOB without +2 total assist at this time. Bed pad used for support under hips as pt was pivoted from bed to chair.   Ambulation/Gait             General Gait Details: unable for > 6 months   Stairs            Wheelchair Mobility    Modified Rankin (Stroke Patients Only)       Balance Overall balance assessment: Needs  assistance Sitting-balance support: Feet supported;No upper extremity supported Sitting balance-Leahy Scale: Zero Sitting balance - Comments: Required max assist at times to maintain sitting balance at EOB.      Standing balance-Leahy Scale: Zero                      Cognition Arousal/Alertness: Lethargic Behavior During Therapy: Flat affect Overall Cognitive Status: History of cognitive impairments - at baseline       Memory: Decreased short-term memory              Exercises      General Comments        Pertinent Vitals/Pain Pain Assessment: Faces Faces Pain Scale: Hurts even more Pain Location: Low back Pain Intervention(s): Limited activity within patient's tolerance;Monitored during session;Repositioned    Home Living                      Prior Function            PT Goals (current goals can now be found in the care plan section) Acute Rehab PT Goals Patient Stated Goal: did not state PT Goal Formulation: With patient Time For Goal Achievement: 12/10/14 Potential to Achieve Goals: Fair Progress towards PT goals: Progressing toward goals    Frequency  Min 2X/week    PT Plan Frequency needs to be updated    Co-evaluation  End of Session Equipment Utilized During Treatment: Gait belt;Oxygen Activity Tolerance: Patient limited by fatigue;Patient limited by pain Patient left: in chair;with chair alarm set;with call bell/phone within reach     Time: 1006-1040 PT Time Calculation (min) (ACUTE ONLY): 34 min  Charges:  $Therapeutic Activity: 23-37 mins                    G Codes:      [name] SlipperKirkman, [name] 11/28/2014, 10:53 AM   [name] SlipperLaura Jourdain [name], PT, DPT Acute Rehabilitation Services Pager: [phone]

## 2014-11-28 NOTE — Progress Notes (Signed)
Subjective:   Doesn't speak, just nods head and occassionally moans. Denies pain. Refusing breakfast  Objective Filed Vitals:   11/27/14 2102 11/28/14 0530 11/28/14 0751 11/28/14 0833  BP: 108/40 98/48  94/40  Pulse: 82 92  87  Temp: 97.7 F (36.5 C) 98.2 F (36.8 C)  98.1 F (36.7 C)  TempSrc: Oral Oral  Oral  Resp: 20 22  17   Weight:      SpO2: 96% 99% 98% 96%   Physical Exam General: Alert, thin, frail, no acute distress.  Heart: irreg. 2/6 systolic murmur Lungs: CTA, shallow, unlabored Abdomen: soft, mild tenderness, pulsating mass Extremities: no edema  Dialysis Access:  R IJ cath   Dialysis Orders: MWF @ [facility] 3:30 160 47.5kgs 3K/2.25ca R IJ cath 3000u heparin 400/1.5 No meds  Assessment/Plan: 1. Lethargy- Almost Baseline now with Recurrent C. Diff/ head and chest CT no acute infarct/findings / Pt lives in NH - palliative care following- poor prognosis 2. Recurrent C Difff. Back On po vanc then rifaximin x 2 weeks. Admit dw ID recommended 3. Abd pain- abd CT. AAA diagnosed in nov-no evidenece of change- not a surgical candidate 4. UTI- Now off rocephin 5. ESRD - MWF @ [facility], HD On schedule / use [facility] as dw pt and grandaughter 6. Hypertension/volume - 94/40 on midodrine 10mg  bid ^ tid ,losing body wt- / BEDS WTS not accurate 7. Anemia - hgb 12.3 >11.7>11.4 no ESA or Fe- cont to watch 8. Metabolic bone disease - with Hypercalcemia- with Corrected ^ Ca+= 11.1 No vit D. No binders- last pth 21 and phos 5 2/24 / using low ca hd bath  9. Nutrition - alb 2.3 poor appetite, renal vit- on Breeze tid , severe malnutrition 10. Lung mass- found in nov- no significant change since nov 11. afib- amiodarone  12. Thrush on Nystatin  13. EOL issues. Severe FTT/ poor quality of life  Jetty DuhamelBridget Whelan, NP [facility] Beeper [phone] 11/28/2014,9:23 AM  LOS: 5 days   Pt seen, examined and agree w A/P as above.  Doing very poorly w  decline over several months if not longer per family.  Will not eat at home.  Now here w recurrent Cdif.  I spoke briefly with the patient about her condition and she said she it "might to time to be with Jesus".  Have d/w family as well the possibility of withdrawal of dialysis.  Vinson Moselleob Emara Lichter MD pager [phone]    cell [phone] 11/28/2014, 1:10 PM    Additional Objective Labs: Basic Metabolic Panel:  Recent Labs Lab 11/23/14 0923 11/24/14 0352 11/25/14 0539  NA 138 136 140  K 4.4 4.8 4.5  CL 103 100 101  CO2  --  18* 17*  GLUCOSE 69* 82 71  BUN 46* 46* 54*  CREATININE 6.00* 7.00* 8.25*  CALCIUM  --  9.5 9.7   Liver Function Tests:  Recent Labs Lab 11/23/14 0905 11/24/14 0352  AST 27 24  ALT 16 14  ALKPHOS 65 66  BILITOT 0.9 0.8  PROT 6.8 6.0  ALBUMIN 2.5* 2.3*   No results for input(s): LIPASE, AMYLASE in the last 168 hours. CBC:  Recent Labs Lab 11/23/14 0905  11/24/14 0352 11/25/14 0539 11/26/14 1115 11/27/14 0549  WBC 10.6*  --  10.5 11.2* 7.3 5.8  NEUTROABS 8.8*  --   --   --   --   --   HGB 13.3  < > 12.3 11.7* 12.0 11.4*  HCT 42.3  < >  38.7 35.8* 36.0 35.4*  MCV 94.6  --  92.6 89.9 90.0 90.3  PLT 66*  --  68* 61* 39* 60*  < > = values in this interval not displayed. Blood Culture    Component Value Date/Time   SDES BLOOD HAND RIGHT 11/23/2014 1500   SPECREQUEST BOTTLES DRAWN AEROBIC AND ANAEROBIC 5CC 11/23/2014 1500   CULT  11/23/2014 1500           BLOOD CULTURE RECEIVED NO GROWTH TO DATE CULTURE WILL BE HELD FOR 5 DAYS BEFORE ISSUING A FINAL NEGATIVE REPORT Performed at [facility]    REPTSTATUS PENDING 11/23/2014 1500    Cardiac Enzymes: No results for input(s): CKTOTAL, CKMB, CKMBINDEX, TROPONINI in the last 168 hours. CBG:  Recent Labs Lab 11/23/14 0939 11/23/14 1641  GLUCAP 78 84   Iron Studies: No results for input(s): IRON, TIBC, TRANSFERRIN, FERRITIN in the last 72 hours. @ Studies/Results: No results  found. Medications:   . allopurinol  100 mg Oral Daily  . amiodarone  200 mg Oral Daily  . arformoterol  15 mcg Nebulization BID  . budesonide  0.5 mg Nebulization BID  . feeding supplement (RESOURCE BREEZE)  1 Container Oral TID BM  . midodrine  10 mg Oral BID WC  . multivitamin  1 tablet Oral QHS  . nystatin  5 mL Oral QID  . sodium chloride  3 mL Intravenous Q12H  . theophylline  200 mg Oral BID  . vancomycin  125 mg Oral 4 times per day

## 2014-11-28 NOTE — Progress Notes (Signed)
NUTRITION FOLLOW UP  Pt meets criteria for SEVERE MALNUTRITION in the context of chronic illness as evidenced by a 17.9% weight loss in 4 months and severe fat and muscle mass loss.  DOCUMENTATION CODES Per approved criteria  -Severe malnutrition in the context of chronic illness -Underweight   INTERVENTION: Provide Resource Breeze po once daily, each supplement provides 250 kcal and 9 grams of protein.  Provide Ensure Complete po once daily, each supplement provides 350 kcal and 13 grams of protein.  Provide Nepro Shake po once daily, each supplement provides 425 kcal and 19 grams protein.  Encourage PO intake.  NUTRITION DIAGNOSIS: Malnutrition related to inadequate oral intake, odynophasia as evidenced by severe fat and muscle mass loss; ongoing  Goal: Pt to meet >/= 90% of their estimated nutrition needs; not met  Monitor:  PO intake, weight trends, labs, I/O's  81 y.o. female  Admitting Dx: C. Diff colitis  ASSESSMENT: Pt with history of ESRD on dialysis on MWF, COPD, hypertension, age of fibrillation on amiodarone. She was recently discharged on 2/9 after being treated for C. difficile colitis. complaints related to lethargy and hypotension noted during hemodialysis. Pt with poor oral intake and recurrent diarrhea.  Pt with ongoing lack of appetite. Meal completion has been 0-25%. Pt has been consuming her [employer] on occasion. RD to order Ensure and Nepro to aid in caloric and protein needs. Daughter reports pt usually drinks Ensure at home. Palliative care has been following, per the MD note, daughter would like to continue current care. RD to continue to monitor.   Labs: Low potassium, calcium, and GFR. High BUN, creatinine, and phosphorous (6.2).  Height: Ht Readings from Last 1 Encounters:  10/28/14 5' 5"  (1.651 m)    Weight: Wt Readings from Last 1 Encounters:  11/28/14 104 lb 0.9 oz (47.2 kg)    BMI:  Body mass index is 17.32 kg/(m^2).  Underweight  Re-Estimated Nutritional Needs: Kcal: 1600-1800 Protein: 65-80 grams Fluid: Per MD  Skin: DTI on sacrum, Stage II pressure ulcer on sacrum  Diet Order: DIET SOFT   Intake/Output Summary (Last 24 hours) at 11/28/14 1509 Last data filed at 11/28/14 1324  Gross per 24 hour  Intake    290 ml  Output      2 ml  Net    288 ml    Last BM: 3/7  Labs:   Recent Labs Lab 11/24/14 0352 11/25/14 0539 11/28/14 1400  NA 136 140 137  K 4.8 4.5 3.2*  CL 100 101 98  CO2 18* 17* 29  BUN 46* 54* 43*  CREATININE 7.00* 8.25* 6.79*  CALCIUM 9.5 9.7 8.3*  PHOS  --   --  6.2*  GLUCOSE 82 71 88    CBG (last 3)  No results for input(s): GLUCAP in the last 72 hours.  Scheduled Meds: . allopurinol  100 mg Oral Daily  . amiodarone  200 mg Oral Daily  . arformoterol  15 mcg Nebulization BID  . budesonide  0.5 mg Nebulization BID  . feeding supplement (RESOURCE BREEZE)  1 Container Oral TID BM  . midodrine  10 mg Oral BID WC  . multivitamin  1 tablet Oral QHS  . nystatin  5 mL Oral QID  . sodium chloride  3 mL Intravenous Q12H  . theophylline  200 mg Oral BID  . vancomycin  125 mg Oral 4 times per day    Continuous Infusions:   Past Medical History  Diagnosis Date  . Hypertension   .  Hyperlipidemia   . COPD (chronic obstructive pulmonary disease)   . Anemia   . ESRD (end stage renal disease) on dialysis   . CHF (congestive heart failure)   . GERD (gastroesophageal reflux disease)   . Constipation   . PEA (Pulseless electrical activity) 04/19/2012    PEA arrest complicated by hyperkalemia, PNA, anocic encephalopathy, VDRF    Past Surgical History  Procedure Laterality Date  . Abdominal hysterectomy    . Arteriovenous fistula  06/02/12    left radiocephalic - failed  . Arteriovenous graft placement  07/14/12    Created at [place], MS, RD, LDN Pager # [phone] After hours/ weekend pager # [phone]

## 2014-11-28 NOTE — Care Management Note (Signed)
CARE MANAGEMENT NOTE 11/28/2014  Patient:  [name],[name]   Account Number:  [id number]  Date Initiated:  11/28/2014  Documentation initiated by:  Jaysean Manville  Subjective/Objective Assessment:   CM following for progression and d/c planning     Action/Plan:   Anticipated DC Date:     Anticipated DC Plan:           Choice offered to / List presented to:             Status of service:  Completed, signed off Medicare Important Message given?  YES (If response is "NO", the following Medicare IM given date fields will be blank) Date Medicare IM given:  11/28/2014 Medicare IM given by:  Penni Penado Date Additional Medicare IM given:   Additional Medicare IM given by:    Discharge Disposition:    Per UR Regulation:    If discussed at Long Length of Stay Meetings, dates discussed:    Comments:

## 2014-11-28 NOTE — Progress Notes (Signed)
Patient WU:JWJX:[name]      DOB: 12/22/1928      BJY:[id number]   [facility] Progress Note    Subjective: Still not eating. Doesn't feel hungry. Feels like she is "dying". Weak. Having some back pain from sitting up in chair. No dyspnea    Filed Vitals:   11/28/14 0833  BP: 94/40  Pulse: 87  Temp: 98.1 F (36.7 C)  Resp: 17   Physical exam: General: Awake, but slightly lethargic HEENT: [place], sclera anicteric Chest: symm expn CVS: regular rate Abdomen: soft, ND Ext: warm  CBC    Component Value Date/Time   WBC 5.8 11/27/2014 0549   RBC 3.92 11/27/2014 0549   HGB 11.4* 11/27/2014 0549   HCT 35.4* 11/27/2014 0549   PLT 60* 11/27/2014 0549   MCV 90.3 11/27/2014 0549   MCH 29.1 11/27/2014 0549   MCHC 32.2 11/27/2014 0549   RDW 17.4* 11/27/2014 0549   LYMPHSABS 0.9 11/23/2014 0905   MONOABS 0.9 11/23/2014 0905   EOSABS 0.0 11/23/2014 0905   BASOSABS 0.0 11/23/2014 0905    CMP     Component Value Date/Time   NA 140 11/25/2014 0539   K 4.5 11/25/2014 0539   CL 101 11/25/2014 0539   CO2 17* 11/25/2014 0539   GLUCOSE 71 11/25/2014 0539   BUN 54* 11/25/2014 0539   CREATININE 8.25* 11/25/2014 0539   CREATININE 1.69* 09/26/2011 1129   CALCIUM 9.7 11/25/2014 0539   PROT 6.0 11/24/2014 0352   ALBUMIN 2.3* 11/24/2014 0352   AST 24 11/24/2014 0352   ALT 14 11/24/2014 0352   ALKPHOS 66 11/24/2014 0352   BILITOT 0.8 11/24/2014 0352   GFRNONAA 4* 11/25/2014 0539   GFRAA 5* 11/25/2014 0539    Assessment and plan: 81 yo female with PMHx of ESRD, c-diff colitis, COPD, afib, AAA, RLL lung mass who presented with lethargy and hypotension from SNF  1. Code Status: DNR after discussion today  2. GOC: [name] MouldingRuth still not doing well. I asked her what she thought was happening with not eating. She tells me she believes she is "dying".  Remians fairly withdrawn from conversation and does not elaborate much. Denies any fears of dying or existential  concerns.  Does not really engage me in how to handle this.  I spoke with daughter [name] today.  She is also worried about how her mom is doing and what all this means. She has been seeing her decline.  Requests that we "continue all that we are doing".  She agreed to meet with me tomorrow around 930 to discuss. In interim, [name] agrees that DNR makes sense as unlikely to be helpful to her and significant risk of harm.  I will meet with them tomorrow morning.   3. Symptom Management:  1. Loss of Appetite: I am not hopeful that any medications will help this. Stimulants with most data are steroids/megace but she isn't taking pills and I worry about side effect profile for them. Could try mirtazapine ODT but doubt this makes significant impact.  4. Psychosocial/Spiritual: used to enjoy going to church. [place]. 2 daughters [name] and [name].   [name] BrillAaron J. Naomie [name] D.O. Palliative Medicine Team at [facility]  Pager: [phone] Team Phone: [phone]

## 2014-11-29 DIAGNOSIS — Z7189 Other specified counseling: Secondary | ICD-10-CM

## 2014-11-29 LAB — CULTURE, BLOOD (ROUTINE X 2)
Culture: NO GROWTH
Culture: NO GROWTH

## 2014-11-29 MED ORDER — MIRTAZAPINE 15 MG PO TABS
15.0000 mg | ORAL_TABLET | Freq: Every day | ORAL | Status: DC
  Administered 2014-11-29: 15 mg via ORAL
  Filled 2014-11-29 (×2): qty 1

## 2014-11-29 NOTE — Progress Notes (Signed)
Patient [name]      DOB: [birth year]      TKP:[id number]   Palliative Medicine Team at [facility] Progress Note    Subjective: More alert today. Ate small amount of breakfast but appetite remains poor.  No other acute complaints. Family at bedside.      Filed Vitals:   11/29/14 0800  BP: 110/51  Pulse: 78  Temp: 98.4 F (36.9 C)  Resp: 18   Physical exam: General: more alert today. HEENT: [place], sclera anicteric Chest: symm expn CVS: regular rate Abdomen: soft, ND Ext: warm  CBC    Component Value Date/Time   WBC 5.5 11/28/2014 1400   RBC 3.59* 11/28/2014 1400   HGB 10.5* 11/28/2014 1400   HCT 32.8* 11/28/2014 1400   HCT 37.2 11/26/2014 1500   PLT 65* 11/28/2014 1400   MCV 91.4 11/28/2014 1400   MCH 29.2 11/28/2014 1400   MCHC 32.0 11/28/2014 1400   RDW 17.4* 11/28/2014 1400   LYMPHSABS 0.9 11/23/2014 0905   MONOABS 0.9 11/23/2014 0905   EOSABS 0.0 11/23/2014 0905   BASOSABS 0.0 11/23/2014 0905    CMP     Component Value Date/Time   NA 137 11/28/2014 1400   K 3.2* 11/28/2014 1400   CL 98 11/28/2014 1400   CO2 29 11/28/2014 1400   GLUCOSE 88 11/28/2014 1400   BUN 43* 11/28/2014 1400   CREATININE 6.79* 11/28/2014 1400   CREATININE 1.69* 09/26/2011 1129   CALCIUM 8.3* 11/28/2014 1400   PROT 6.0 11/24/2014 0352   ALBUMIN 2.0* 11/28/2014 1400   AST 24 11/24/2014 0352   ALT 14 11/24/2014 0352   ALKPHOS 66 11/24/2014 0352   BILITOT 0.8 11/24/2014 0352   GFRNONAA 5* 11/28/2014 1400   GFRAA 6* 11/28/2014 1400     Assessment and plan: 81 yo female with PMHx of ESRD, c-diff colitis, COPD, afib, AAA, RLL lung mass who presented with lethargy and hypotension from SNF  1. Code Status: DNR   2. GOC: Met with both daughters and several grandchildren today. Sister from [place] also present at bedside.  We attempted to include [name] in conversation as much as possible, but she often deferred and deflected difficult conversation. I discussed with family  worrisome trajectory in setting of all her other health issues.  Im not sure our efforts will have significant impact on her care.  They can acknowledge this some as well and share our concerns.  They remain hopeful that with SNF, continued encouragement with diet, help from god that [name] can have improvement from here. We discussed risk for setbacks and importance of monitoring if we are actually making progress towards those goals if she goes to SNF.  They were encouraged that she was more alert today.  I stated that I thought it unlikely she will meet those goals and family seemed to even agree with this.  Grandson in particular has seen her struggle and has even researched hospice facilities and home hospice. He was actually quite well informed about this.  We talked about what hospice care generally entails and also about stopping dialysis if pursuing hospice. Goals of hospice to provide comfort, dignity and provide as much enjoyment with time remaining.  Family is certainly not at that point as goal is to continue to work to restore her as much as able.  I encouraged them as they pursue this trial of therapy to monitor if we are meeting our goals with SNF (is she getting stronger, eating more, setbacks,  etc) and ensure that our care is helping to provide meaning, purpose, enjoyment to her life.  I think they would benefit and appreciate palliative care services offered at SNF should she go there.  Would place this recommendation in DC summary.  I sense that some family members have realistic expectations for how she will do and others still struggling.   3. Symptom Management:  1. Loss of Appetite: we talked about risk/benefit of multiple appetite stimulants.  Family wanting to try. Again I do not suspect will make a big impact and I tried to stress this with family along with limited evidence these medications add lean muscle mass.  Will go ahead and start mirtazapine 15mg qhs given its  favorable side effect profile.   4. Psychosocial/Spiritual: used to enjoy going to church. Lives independently in [place] prior to SNF. 2 daughters [name] and Carol.   Total Time: 75 minutes Time In: 930 Time Out: 1045  >50% of time spent in counseling and coordination of care regarding above.    J.  D.O. Palliative Medicine Team at [place]  Pager: [phone] Team Phone: [phone]   

## 2014-11-29 NOTE — Progress Notes (Addendum)
Subjective:   Pleasant, no distress or c/o's  Objective Filed Vitals:   11/28/14 2103 11/28/14 2146 11/29/14 0800 11/29/14 0900  BP:  102/46 110/51   Pulse:  78 78   Temp:  97.4 F (36.3 C) 98.4 F (36.9 C)   TempSrc:  Oral Oral   Resp:  18 18   Weight:      SpO2: 99% 100% 99% 99%   Physical Exam General: Alert, thin, frail, no acute distress.  Heart: irreg. 2/6 systolic murmur Lungs: CTA, shallow, unlabored Abdomen: soft, mild tenderness, pulsating mass Extremities: no edema  Dialysis Access:  R IJ cath   Dialysis Orders: MWF @ [facility] 3:30 160 47.5kgs 3K/2.25ca R IJ cath 3000u heparin 400/1.5 No meds   Assessment: 1. Recurrent C Difff. Back On po vanc then rifaximin x 2 weeks. Admit dw ID recommended 2. Abd pain- abd CT. AAA diagnosed in nov-no evidenece of change- not a surgical candidate 3. UTI- Now off rocephin 4. Severe FTT/ poor quality of life - is now DNR, appreciate pall care assistance. 5. ESRD - MWF @ [facility] 6. Hypertension/volume - 94/40 on midodrine 10mg  bid ^ tid ,losing body wt- / BEDS WTS not accurate 7. Anemia - hgb 12.3 >11.7>11.4 no ESA or Fe- cont to watch 8. Metabolic bone disease - with Hypercalcemia- with Corrected ^ Ca+= 11.1 No vit D. No binders- last pth 21 and phos 5 2/24 / using low ca hd bath  9. Nutrition - alb 2.3 poor appetite, renal vit- on Breeze tid , severe malnutrition 10. Lung nodule -  2.1 cm, no change since november 11. Afib- amiodarone  12. Thrush on Nystatin  13. DNR 14. Thrombocytopenia - no hep with HD for now, plts 60-70k  Plan - HD tomorrow, use recliner, keep even, no hep.  Needs to be able to dialyze in a chair before can be dc'd to SNF. This may be a challenge   Vinson Moselleob Samarah Hogle MD pager [phone]    cell [phone] 11/29/2014, 2:56 PM    Additional Objective Labs: Basic Metabolic Panel:  Recent Labs Lab 11/24/14 0352 11/25/14 0539 11/28/14 1400  NA 136 140 137  K 4.8 4.5 3.2*  CL 100 101 98   CO2 18* 17* 29  GLUCOSE 82 71 88  BUN 46* 54* 43*  CREATININE 7.00* 8.25* 6.79*  CALCIUM 9.5 9.7 8.3*  PHOS  --   --  6.2*   Liver Function Tests:  Recent Labs Lab 11/23/14 0905 11/24/14 0352 11/28/14 1400  AST 27 24  --   ALT 16 14  --   ALKPHOS 65 66  --   BILITOT 0.9 0.8  --   PROT 6.8 6.0  --   ALBUMIN 2.5* 2.3* 2.0*   No results for input(s): LIPASE, AMYLASE in the last 168 hours. CBC:  Recent Labs Lab 11/23/14 0905  11/24/14 0352 11/25/14 0539 11/26/14 1115 11/26/14 1500 11/27/14 0549 11/28/14 1400  WBC 10.6*  --  10.5 11.2* 7.3  --  5.8 5.5  NEUTROABS 8.8*  --   --   --   --   --   --   --   HGB 13.3  < > 12.3 11.7* 12.0  --  11.4* 10.5*  HCT 42.3  < > 38.7 35.8* 36.0 37.2 35.4* 32.8*  MCV 94.6  --  92.6 89.9 90.0  --  90.3 91.4  PLT 66*  --  68* 61* 39*  --  60* 65*  < > = values in  this interval not displayed. Blood Culture    Component Value Date/Time   SDES BLOOD HAND RIGHT 11/23/2014 1500   SPECREQUEST BOTTLES DRAWN AEROBIC AND ANAEROBIC 5CC 11/23/2014 1500   CULT  11/23/2014 1500    NO GROWTH 5 DAYS Performed at [facility]    REPTSTATUS 11/29/2014 FINAL 11/23/2014 1500    Cardiac Enzymes: No results for input(s): CKTOTAL, CKMB, CKMBINDEX, TROPONINI in the last 168 hours. CBG:  Recent Labs Lab 11/23/14 0939 11/23/14 1641  GLUCAP 78 84   Iron Studies: No results for input(s): IRON, TIBC, TRANSFERRIN, FERRITIN in the last 72 hours. @ Studies/Results: No results found. Medications:   . allopurinol  100 mg Oral Daily  . amiodarone  200 mg Oral Daily  . arformoterol  15 mcg Nebulization BID  . budesonide  0.5 mg Nebulization BID  . feeding supplement (ENSURE COMPLETE)  237 mL Oral Daily  . feeding supplement (NEPRO CARB STEADY)  237 mL Oral Q1200  . feeding supplement (RESOURCE BREEZE)  1 Container Oral Q1500  . midodrine  10 mg Oral BID WC  . multivitamin  1 tablet Oral QHS  . nystatin  5 mL Oral QID  .  sodium chloride  3 mL Intravenous Q12H  . theophylline  200 mg Oral BID  . vancomycin  125 mg Oral 4 times per day

## 2014-11-29 NOTE — Progress Notes (Signed)
PROGRESS NOTE  [name] WUJ:[id number] DOB: 1929-04-23 DOA: 11/23/2014 PCP: [name] RakesHODGES,[name], MD  Assessment/Plan: c diff, relapse Recently admitted for same, completed vancomycin course, then diarrhea returned.  PCR positive.  Dr. Lendell CapriceSullivan D/w Dr. Luciana Axeomer. He recommends vancomycin PO 125 qid x2 weeks, then rifaximin 400 mg bid x2 weeks  Abnormal UA: Culture negative. Ceftriaxone stopped, particularly given continued CDI   Thrombocytopenia, new Secondary to infection? No bleeding.  - Getting heparin with HD.  HIT panel unable to be run due to hemolysis, folate ok, B12 ok  -stable  Odynophagia/suspected oral Candida continue nystatin   ESRD on dialysis Nephrology following   Chronic A-fib Not on anticoagulation   AAA  Increased size compared to 2013. Not a surgical candidate per family. I agree   COPD  stable   Spiculated right Lung mass -Has not increased in size since last imaging November 2015. Doubt a candidate for resection, chemo even if neoplasm.   Protein-calorie malnutrition, severe CDI, thrush contributing  Minimal improvement. Given multiple serious medical problems, FTT, decline in functional status, prognosis appears poor. Also, quality of life worsening.  Dr. Darrick Pennaeterding agrees. Dr. Lendell CapriceSullivan discussed with family who voice understanding. They agree to discuss with PMT GOC- has been made DNR- plan to send back to SNF with palliative services following  Code Status: DNR Family Communication: no family at bedside Disposition Plan: back to SNF when improved and/or GOC established   Consultants:  Renal  Palliative care  Procedures:   HPI/Subjective: Only taking in a few bites  Objective: Filed Vitals:   11/29/14 0800  BP: 110/51  Pulse: 78  Temp: 98.4 F (36.9 C)  Resp: 18    Intake/Output Summary (Last 24 hours) at 11/29/14 1245 Last data filed at 11/29/14 1110  Gross per 24 hour  Intake    120 ml  Output   -358 ml  Net    478 ml    Filed Weights   11/26/14 2118 11/28/14 1326 11/28/14 1741  Weight: 48.4 kg (106 lb 11.2 oz) 47.2 kg (104 lb 0.9 oz) 47.4 kg (104 lb 8 oz)    Exam:   General:  Surrounded by family  Cardiovascular: rrr without MGR  Respiratory: clear without WRR  Abdomen: soft, NT, ND  Musculoskeletal: no edema   Data Reviewed: Basic Metabolic Panel:  Recent Labs Lab 11/23/14 0923 11/24/14 0352 11/25/14 0539 11/28/14 1400  NA 138 136 140 137  K 4.4 4.8 4.5 3.2*  CL 103 100 101 98  CO2  --  18* 17* 29  GLUCOSE 69* 82 71 88  BUN 46* 46* 54* 43*  CREATININE 6.00* 7.00* 8.25* 6.79*  CALCIUM  --  9.5 9.7 8.3*  PHOS  --   --   --  6.2*   Liver Function Tests:  Recent Labs Lab 11/23/14 0905 11/24/14 0352 11/28/14 1400  AST 27 24  --   ALT 16 14  --   ALKPHOS 65 66  --   BILITOT 0.9 0.8  --   PROT 6.8 6.0  --   ALBUMIN 2.5* 2.3* 2.0*   No results for input(s): LIPASE, AMYLASE in the last 168 hours. No results for input(s): AMMONIA in the last 168 hours. CBC:  Recent Labs Lab 11/23/14 0905  11/24/14 0352 11/25/14 0539 11/26/14 1115 11/26/14 1500 11/27/14 0549 11/28/14 1400  WBC 10.6*  --  10.5 11.2* 7.3  --  5.8 5.5  NEUTROABS 8.8*  --   --   --   --   --   --   --  HGB 13.3  < > 12.3 11.7* 12.0  --  11.4* 10.5*  HCT 42.3  < > 38.7 35.8* 36.0 37.2 35.4* 32.8*  MCV 94.6  --  92.6 89.9 90.0  --  90.3 91.4  PLT 66*  --  68* 61* 39*  --  60* 65*  < > = values in this interval not displayed. Cardiac Enzymes: No results for input(s): CKTOTAL, CKMB, CKMBINDEX, TROPONINI in the last 168 hours. BNP (last 3 results) No results for input(s): BNP in the last 8760 hours.  ProBNP (last 3 results) No results for input(s): PROBNP in the last 8760 hours.  CBG:  Recent Labs Lab 11/23/14 0939 11/23/14 1641  GLUCAP 78 84    Recent Results (from the past 240 hour(s))  Urine culture     Status: None   Collection Time: 11/23/14  9:45 AM  Result Value Ref Range Status    Specimen Description URINE, CATHETERIZED  Final   Special Requests NONE  Final   Colony Count NO GROWTH Performed at [facility]   Final   Culture NO GROWTH Performed at [facility]   Final   Report Status 11/25/2014 FINAL  Final  Culture, blood (routine x 2)     Status: None   Collection Time: 11/23/14  2:45 PM  Result Value Ref Range Status   Specimen Description BLOOD HAND RIGHT  Final   Special Requests BOTTLES DRAWN AEROBIC AND ANAEROBIC 5CC  Final   Culture   Final    NO GROWTH 5 DAYS Performed at [facility]    Report Status 11/29/2014 FINAL  Final  Culture, blood (routine x 2)     Status: None   Collection Time: 11/23/14  3:00 PM  Result Value Ref Range Status   Specimen Description BLOOD HAND RIGHT  Final   Special Requests BOTTLES DRAWN AEROBIC AND ANAEROBIC 5CC  Final   Culture   Final    NO GROWTH 5 DAYS Performed at [facility]    Report Status 11/29/2014 FINAL  Final  Clostridium Difficile by PCR     Status: Abnormal   Collection Time: 11/23/14  6:25 PM  Result Value Ref Range Status   C difficile by pcr POSITIVE (A) NEGATIVE Final    Comment: CRITICAL RESULT CALLED TO, READ BACK BY AND VERIFIED WITH: CHANEY,T RN @ 1247 11/24/14 LEONARD,A      Studies: No results found.  Scheduled Meds: . allopurinol  100 mg Oral Daily  . amiodarone  200 mg Oral Daily  . arformoterol  15 mcg Nebulization BID  . budesonide  0.5 mg Nebulization BID  . feeding supplement (ENSURE COMPLETE)  237 mL Oral Daily  . feeding supplement (NEPRO CARB STEADY)  237 mL Oral Q1200  . feeding supplement (RESOURCE BREEZE)  1 Container Oral Q1500  . midodrine  10 mg Oral BID WC  . multivitamin  1 tablet Oral QHS  . nystatin  5 mL Oral QID  . sodium chloride  3 mL Intravenous Q12H  . theophylline  200 mg Oral BID  . vancomycin  125 mg Oral 4 times per day   Continuous Infusions:  Antibiotics Given (last 72 hours)    Date/Time Action Medication Dose     11/27/14 0024 Given   vancomycin (VANCOCIN) 50 mg/mL oral solution 125 mg 125 mg   11/27/14 0642 Given   vancomycin (VANCOCIN) 50 mg/mL oral solution 125 mg 125 mg   11/27/14 1413 Given   vancomycin (VANCOCIN)  50 mg/mL oral solution 125 mg 125 mg   11/27/14 1804 Given   vancomycin (VANCOCIN) 50 mg/mL oral solution 125 mg 125 mg   11/27/14 2227 Given   vancomycin (VANCOCIN) 50 mg/mL oral solution 125 mg 125 mg   11/28/14 0642 Given   vancomycin (VANCOCIN) 50 mg/mL oral solution 125 mg 125 mg   11/28/14 1140 Given   vancomycin (VANCOCIN) 50 mg/mL oral solution 125 mg 125 mg   11/28/14 1831 Given   vancomycin (VANCOCIN) 50 mg/mL oral solution 125 mg 125 mg   11/29/14 0600 Given   vancomycin (VANCOCIN) 50 mg/mL oral solution 125 mg 125 mg   11/29/14 1149 Given   vancomycin (VANCOCIN) 50 mg/mL oral solution 125 mg 125 mg     Time spent: 25 min  Jennifermarie Franzen  [facility]  [email or web address], password [facility] 11/29/2014, 12:45 PM  LOS: 6 days

## 2014-11-30 DIAGNOSIS — I714 Abdominal aortic aneurysm, without rupture: Secondary | ICD-10-CM | POA: Diagnosis not present

## 2014-11-30 DIAGNOSIS — Z7189 Other specified counseling: Secondary | ICD-10-CM | POA: Diagnosis not present

## 2014-11-30 DIAGNOSIS — I4891 Unspecified atrial fibrillation: Secondary | ICD-10-CM | POA: Diagnosis not present

## 2014-11-30 DIAGNOSIS — R1311 Dysphagia, oral phase: Secondary | ICD-10-CM | POA: Diagnosis not present

## 2014-11-30 DIAGNOSIS — A047 Enterocolitis due to Clostridium difficile: Secondary | ICD-10-CM | POA: Diagnosis not present

## 2014-11-30 DIAGNOSIS — R410 Disorientation, unspecified: Secondary | ICD-10-CM | POA: Diagnosis not present

## 2014-11-30 DIAGNOSIS — N390 Urinary tract infection, site not specified: Secondary | ICD-10-CM | POA: Diagnosis not present

## 2014-11-30 DIAGNOSIS — R0902 Hypoxemia: Secondary | ICD-10-CM | POA: Diagnosis not present

## 2014-11-30 DIAGNOSIS — R1084 Generalized abdominal pain: Secondary | ICD-10-CM | POA: Diagnosis not present

## 2014-11-30 DIAGNOSIS — Z992 Dependence on renal dialysis: Secondary | ICD-10-CM | POA: Diagnosis not present

## 2014-11-30 DIAGNOSIS — N186 End stage renal disease: Secondary | ICD-10-CM | POA: Diagnosis not present

## 2014-11-30 DIAGNOSIS — M6281 Muscle weakness (generalized): Secondary | ICD-10-CM | POA: Diagnosis not present

## 2014-11-30 DIAGNOSIS — J449 Chronic obstructive pulmonary disease, unspecified: Secondary | ICD-10-CM | POA: Diagnosis not present

## 2014-11-30 DIAGNOSIS — R262 Difficulty in walking, not elsewhere classified: Secondary | ICD-10-CM | POA: Diagnosis not present

## 2014-11-30 DIAGNOSIS — I959 Hypotension, unspecified: Secondary | ICD-10-CM | POA: Diagnosis not present

## 2014-11-30 DIAGNOSIS — I951 Orthostatic hypotension: Secondary | ICD-10-CM | POA: Diagnosis not present

## 2014-11-30 DIAGNOSIS — R630 Anorexia: Secondary | ICD-10-CM | POA: Diagnosis not present

## 2014-11-30 LAB — HEPARIN INDUCED THROMBOCYTOPENIA PNL: Heparin Induced Plt Ab: 0.24 OD (ref 0.000–0.400)

## 2014-11-30 MED ORDER — MIDODRINE HCL 5 MG PO TABS
ORAL_TABLET | ORAL | Status: AC
  Filled 2014-11-30: qty 2

## 2014-11-30 MED ORDER — VANCOMYCIN 50 MG/ML ORAL SOLUTION: 125.0000 mg | Freq: Four times a day (QID) | ORAL | Status: AC

## 2014-11-30 MED ORDER — ENSURE COMPLETE PO LIQD: 237.0000 mL | Freq: Every day | ORAL | Status: AC

## 2014-11-30 MED ORDER — BOOST / RESOURCE BREEZE PO LIQD: 1.0000 | Freq: Every day | ORAL | Status: AC

## 2014-11-30 MED ORDER — NEPRO/CARBSTEADY PO LIQD: 237.0000 mL | Freq: Every day | ORAL | Status: AC

## 2014-11-30 MED ORDER — MIRTAZAPINE 15 MG PO TABS: 15.0000 mg | ORAL_TABLET | Freq: Every day | ORAL | Status: AC

## 2014-11-30 MED ORDER — NYSTATIN 100000 UNIT/ML MT SUSP: 5.0000 mL | Freq: Four times a day (QID) | OROMUCOSAL | Status: AC

## 2014-11-30 NOTE — Clinical Social Work Note (Signed)
Ms. [name] will discharge back to [facility] today. Discharge information forwarded to facility and patient will be transported by ambulance. Daughter, [name] contacted by phone and CSW talked with daughter at the bedside regarding ambulance transport.   [name], MSW, LCSW Licensed Clinical Social Worker Clinical Social Work Department [facility] [phone]

## 2014-11-30 NOTE — Progress Notes (Signed)
Subjective:   Pleasant, no distress or c/o's. Up in chair on HD.   Objective Filed Vitals:   11/30/14 0730 11/30/14 0800 11/30/14 0900 11/30/14 0930  BP: 102/53 86/44 94/49  98/65  Pulse: 79 85 75 75  Temp:      TempSrc:      Resp: 24 26 24 25   Weight:      SpO2:       Physical Exam General: Alert, thin, frail, no acute distress.  Heart: irreg. 2/6 systolic murmur Lungs: CTA, shallow, unlabored Abdomen: soft, mild tenderness, pulsating mass Extremities: no edema  Dialysis Access:  R IJ cath   Dialysis Orders: MWF @ [facility] 3:30 160 47.5kgs 3K/2.25ca R IJ cath 3000u heparin 400/1.5 No meds   Assessment: 1. Recurrent C Difff. Back On po vanc then rifaximin x 2 weeks. Admit dw ID recommended 2. Abd pain- abd CT. AAA diagnosed in nov-no evidenece of change- not a surgical candidate 3. UTI- Now off rocephin 4. Severe FTT  - is now DNR, appreciate pall care assistance. 5. ESRD - MWF @ [facility] 6. HyPOtension/volume - 94/40 on midodrine 10mg  bid ^ tid ,losing body wt- / BEDS WTS not accurate 7. Anemia - hgb 12.3 >11.7>11.4 no ESA or Fe- cont to watch 8. Metabolic bone disease - with Hypercalcemia- with Corrected ^ Ca+= 11.1 No vit D. No binders- last pth 21 and phos 5 2/24 / using low ca hd bath  9. Nutrition - alb 2.3 poor appetite, renal vit- on Breeze tid , severe malnutrition 10. Lung nodule -  2.1 cm, no change since november 11. Afib- amiodarone  12. Thrush on Nystatin  13. DNR 14. Thrombocytopenia - no hep with HD for now, plts 60-70k  Plan - HD today ongoing in chair. Not sure if she is strong enough to go home - she thinks she is, depends on family support w getting her to and from dialysis.     Vinson Moselleob Grayden Burley MD pager [phone]    cell [phone] 11/30/2014, 9:42 AM    Additional Objective Labs: Basic Metabolic Panel:  Recent Labs Lab 11/24/14 0352 11/25/14 0539 11/28/14 1400  NA 136 140 137  K 4.8 4.5 3.2*  CL 100 101 98  CO2 18* 17* 29   GLUCOSE 82 71 88  BUN 46* 54* 43*  CREATININE 7.00* 8.25* 6.79*  CALCIUM 9.5 9.7 8.3*  PHOS  --   --  6.2*   Liver Function Tests:  Recent Labs Lab 11/24/14 0352 11/28/14 1400  AST 24  --   ALT 14  --   ALKPHOS 66  --   BILITOT 0.8  --   PROT 6.0  --   ALBUMIN 2.3* 2.0*   No results for input(s): LIPASE, AMYLASE in the last 168 hours. CBC:  Recent Labs Lab 11/24/14 0352 11/25/14 0539 11/26/14 1115 11/26/14 1500 11/27/14 0549 11/28/14 1400  WBC 10.5 11.2* 7.3  --  5.8 5.5  HGB 12.3 11.7* 12.0  --  11.4* 10.5*  HCT 38.7 35.8* 36.0 37.2 35.4* 32.8*  MCV 92.6 89.9 90.0  --  90.3 91.4  PLT 68* 61* 39*  --  60* 65*   Blood Culture    Component Value Date/Time   SDES BLOOD HAND RIGHT 11/23/2014 1500   SPECREQUEST BOTTLES DRAWN AEROBIC AND ANAEROBIC 5CC 11/23/2014 1500   CULT  11/23/2014 1500    NO GROWTH 5 DAYS Performed at [facility]    REPTSTATUS 11/29/2014 FINAL 11/23/2014 1500    Cardiac Enzymes:  No results for input(s): CKTOTAL, CKMB, CKMBINDEX, TROPONINI in the last 168 hours. CBG:  Recent Labs Lab 11/23/14 1641  GLUCAP 84   Iron Studies: No results for input(s): IRON, TIBC, TRANSFERRIN, FERRITIN in the last 72 hours. @ Studies/Results: No results found. Medications:   . allopurinol  100 mg Oral Daily  . amiodarone  200 mg Oral Daily  . arformoterol  15 mcg Nebulization BID  . budesonide  0.5 mg Nebulization BID  . feeding supplement (ENSURE COMPLETE)  237 mL Oral Daily  . feeding supplement (NEPRO CARB STEADY)  237 mL Oral Q1200  . feeding supplement (RESOURCE BREEZE)  1 Container Oral Q1500  . midodrine  10 mg Oral BID WC  . mirtazapine  15 mg Oral QHS  . multivitamin  1 tablet Oral QHS  . nystatin  5 mL Oral QID  . sodium chloride  3 mL Intravenous Q12H  . theophylline  200 mg Oral BID  . vancomycin  125 mg Oral 4 times per day

## 2014-11-30 NOTE — Clinical Social Work Psychosocial (Signed)
Clinical Social Work Department BRIEF PSYCHOSOCIAL ASSESSMENT 11/30/2014  [name]     Account Number:  [id number]     Admit date:  11/23/2014  Clinical Social Worker:  Delmer IslamRAWFORD,Ivy Puryear, LCSW  Date/Time:  11/30/2014 02:56 AM  Referred by:  Physician  Date Referred:  11/30/2014 Referred for  SNF Placement   Other Referral:   Interview type:  Family Other interview type:    PSYCHOSOCIAL DATA Living Status:  FACILITY [facility] Level of care:  Skilled Nursing Facility Primary support name:  Maryella ShiversCharlene [name] Primary support relationship to patient:  CHILD, ADULT Degree of support available:   Good support    CURRENT CONCERNS Current Concerns  Post-Acute Placement   Other Concerns:    SOCIAL WORK ASSESSMENT / PLAN CSW unable to speak with patient as she not fully oriented. CSW talked with daughter Westley HummerCharlene by phone and confirmed that patient will discharge back [facility]. Daughter informed that patient medically stable for discharge today.   Assessment/plan status:  No Further Intervention Required Other assessment/ plan:   Information/referral to community resources:   None needed or requested    PATIENT'S/FAMILY'S RESPONSE TO PLAN OF CARE: Daughter open to talking with CSW and appreciative of information regarding discharge.  CSW talked later with daughter at the bedside and informed that transport will be called once report given by nurse.       Genelle BalVanessa Sharvi Mooneyhan, MSW, LCSW Licensed Clinical Social Worker Clinical Social Work Department [facility] [phone]

## 2014-11-30 NOTE — Progress Notes (Signed)
Report called to [facility] Luiz IronPam Thacker, LPN.

## 2014-11-30 NOTE — Discharge Summary (Signed)
Physician Discharge Summary  [name] PieriniRuth Epple ZOX:[id number] DOB: 1928/11/23 DOA: 11/23/2014  PCP: [name],[name], MD  Admit date: 11/23/2014 Discharge date: 11/30/2014  Time spent: 35 minutes  Recommendations for Outpatient Follow-up:  1. palliative services to follow at SNF 2. DNR 3. Continue HD 4. remeron started for appetite stimulation 5.  vancomycin PO 125 qid x2 weeks (STOP DATE: 3/17) , then rifaximin 400 mg bid x2 weeks (3/17-3/31)  Discharge Diagnoses:  Principal Problem:   C. difficile colitis Active Problems:   Essential hypertension   AAA (abdominal aortic aneurysm)   ESRD on dialysis   A-fib   COPD (chronic obstructive pulmonary disease)   Lung mass   Diarrhea   Protein-calorie malnutrition, severe   UTI (lower urinary tract infection)   Thrombocytopenia   Odynophagia   Oral candidiasis   Palliative care encounter   Loss of appetite   Delirium   DNR (do not resuscitate) discussion   Discharge Condition: stable-poor overall prognosis  Diet recommendation: renal  Filed Weights   11/29/14 2117 11/30/14 0703 11/30/14 1050  Weight: 46.176 kg (101 lb 12.8 oz) 47.2 kg (104 lb 0.9 oz) 47.5 kg (104 lb 11.5 oz)    History of present illness:  [name] is a 81 y.o. female, with history of chronic kidney disease on dialysis on MWF, COPD, hypertension, age of fibrillation on amiodarone. She was recently discharged on 2/9 after being treated for C. difficile colitis. She was discharged on oral vancomycin to continue for 10 more days. Patient also has a known right lung mass initially seen on CT on 08/02/2014 and recommendations were for the patient to follow-up a pulmonary medicine as an outpatient. According to the family the patient has undergone a ? PET scan recently but these results have not been given to the family.  She returns to the ER today for complaints related to lethargy and hypotension noted during hemodialysis in [place]. Apparently this patient had been  feeling weak and tired with poor appetite since she was discharged but had not had any further diarrhea until yesterday evening. She been having intermittent abdominal pain primarily in the lower abdomen. Upon presentation to the ER patient was afebrile, she was mildly tachypnea with a respiratory rate of 27 and she was hypoxic with O2 saturation of 89% therefore 2 L nasal cannula oxygen was applied. Her blood pressure was 108/51 with a heart rate of 98. Given her complaints of abdominal pain and known abdominal aortic aneurysm a CT angios abdomen and pelvis was completed. No aortic dissection was seen, she also underwent a CT of the chest that showed stable 2.1 cm spiculated nodule in the right lower lobe. Her troponin was normal at 0.06. Her lactic acid was elevated at 2.35. Her hemoglobin was elevated at 17.7. She had mild leukocytosis white count 10,600. He was also found have a new thrombocytopenia platelets 66,000. Her urinalysis was abnormal and concerning for urinary tract infection and also was consistent with volume depletion. Urine and blood cultures were obtained in the ER and patient was started empirically on Rocephin for presumed urinary tract infection  Hospital Course:  c diff, relapse Recently admitted for same, completed vancomycin course, then diarrhea returned. PCR positive. Dr. Lendell CapriceSullivan D/w Dr. Luciana Axeomer. He recommends vancomycin PO 125 qid x2 weeks, then rifaximin 400 mg bid x2 weeks  Abnormal UA: Culture negative. Ceftriaxone stopped, particularly given continued CDI   Thrombocytopenia, new Secondary to infection? No bleeding.  - Getting heparin with HD. HIT panel unable to be run  due to hemolysis, folate ok, B12 ok  -stable  Odynophagia/suspected oral Candida continue nystatin until resolved   ESRD on dialysis Nephrology following   Chronic A-fib Not on anticoagulation   AAA  Increased size compared to 2013. Not a surgical candidate per family   COPD   stable   Spiculated right Lung mass -Has not increased in size since last imaging November 2015. Doubt a candidate for resection, chemo even if neoplasm.   Protein-calorie malnutrition, severe CDI, thrush contributing  Procedures:  HD  Consultations:  Renal  Palliative care  Discharge Exam: Filed Vitals:   11/30/14 1050  BP: 116/65  Pulse: 76  Temp: 97.7 F (36.5 C)  Resp: 26    General: awake- tired after dialysis Cardiovascular: rrr Respiratory: clear  Discharge Instructions   Discharge Instructions    Discharge instructions    Complete by:  As directed   Renal diet HD as previously scheduled Palliative services to follow at SNF     Increase activity slowly    Complete by:  As directed           Current Discharge Medication List    START taking these medications   Details  !! feeding supplement, ENSURE COMPLETE, (ENSURE COMPLETE) LIQD Take 237 mLs by mouth daily.    !! feeding supplement, RESOURCE BREEZE, (RESOURCE BREEZE) LIQD Take 1 Container by mouth daily at 3 pm. Refills: 0    mirtazapine (REMERON) 15 MG tablet Take 1 tablet (15 mg total) by mouth at bedtime.    !! Nutritional Supplements (FEEDING SUPPLEMENT, NEPRO CARB STEADY,) LIQD Take 237 mLs by mouth daily at 12 noon. Refills: 0    nystatin (MYCOSTATIN) 100000 UNIT/ML suspension Take 5 mLs (500,000 Units total) by mouth 4 (four) times daily. Qty: 60 mL, Refills: 0     !! - Potential duplicate medications found. Please discuss with provider.    CONTINUE these medications which have CHANGED   Details  vancomycin (VANCOCIN) 50 mg/mL oral solution Take 2.5 mLs (125 mg total) by mouth every 6 (six) hours.      CONTINUE these medications which have NOT CHANGED   Details  albuterol (PROVENTIL) (2.5 MG/3ML) 0.083% nebulizer solution Take 2.5 mg by nebulization every 6 (six) hours as needed for wheezing or shortness of breath.     allopurinol (ZYLOPRIM) 100 MG tablet Take 100 mg by  mouth daily.     amiodarone (PACERONE) 200 MG tablet Take 1 tablet (200 mg total) by mouth daily. Qty: 30 tablet, Refills: 1    arformoterol (BROVANA) 15 MCG/2ML NEBU Take 15 mcg by nebulization 2 (two) times daily.    budesonide (PULMICORT) 0.5 MG/2ML nebulizer solution Take 0.5 mg by nebulization 2 (two) times daily.    midodrine (PROAMATINE) 10 MG tablet Take 1 tablet (10 mg total) by mouth 2 (two) times daily with a meal. Qty: 60 tablet, Refills: 1    multivitamin (RENA-VIT) TABS tablet Take 1 tablet by mouth daily.    omeprazole (PRILOSEC) 20 MG capsule Take 20 mg by mouth daily.    theophylline (THEODUR) 100 MG 12 hr tablet Take 200 mg by mouth 2 (two) times daily.       STOP taking these medications     aspirin 81 MG tablet      PRESCRIPTION MEDICATION      senna (SENOKOT) 8.6 MG TABS        No Known Allergies    The results of significant diagnostics from this hospitalization (including imaging, microbiology,  ancillary and laboratory) are listed below for reference.    Significant Diagnostic Studies: Dg Chest 2 View  11/23/2014   CLINICAL DATA:  Weakness and shortness of breath  EXAM: CHEST  2 VIEW  COMPARISON:  11/22/2014  FINDINGS: Cardiac shadow is within normal limits. The lungs are well aerated bilaterally. The known right middle lobe nodule adjacent to the hilum is again seen somewhat obscured by the patient's dialysis catheter. Diffuse aortic calcifications are noted. Tortuosity and aneurysmal dilatation of the thoracic aorta is noted. No acute bony abnormality is seen.  IMPRESSION: No change from the previous day.   Electronically Signed   By: Alcide Clever M.D.   On: 11/23/2014 09:45   Ct Head Wo Contrast  11/23/2014   CLINICAL DATA:  Generalized body weakness. End-stage renal disease. Anemia. Subsequent encounter.  EXAM: CT HEAD WITHOUT CONTRAST  TECHNIQUE: Contiguous axial images were obtained from the base of the skull through the vertex without intravenous  contrast.  COMPARISON:  CT head 07/27/2014.  FINDINGS: No evidence for acute infarction, hemorrhage, mass lesion, hydrocephalus, or extra-axial fluid. Generalized atrophy. Hypoattenuation of white matter representing chronic microvascular ischemic change. Vascular calcification without signs of proximal vascular thrombosis. Calvarium intact. Clear sinuses and mastoids.  IMPRESSION: Chronic changes as described. Similar appearance compared with November 2015.   Electronically Signed   By: Davonna Belling M.D.   On: 11/23/2014 10:50   Ir Fluoro Guide Cv Line Right  10/31/2014   CLINICAL DATA:  Poorly functioning tunneled right jugular dialysis catheter.  EXAM: EXCHANGE OF TUNNELED CENTRAL VENOUS HEMODIALYSIS CATHETER UNDER FLUOROSCOPIC GUIDANCE  ANESTHESIA/SEDATION: Local anesthesia with 1% lidocaine.  MEDICATIONS: No additional medications.  FLUOROSCOPY TIME:  1 min and 16 seconds.  PROCEDURE: The procedure, risks, benefits, and alternatives were explained to the patient. Questions regarding the procedure were encouraged and answered. The patient understands and consents to the procedure.  The preexisting dialysis catheter and surrounding skin were prepped with chlorhexidine in a sterile fashion, and a sterile drape was applied covering the operative field. Maximum barrier sterile technique with sterile gowns and gloves were used for the procedure. Local anesthesia was provided with 1% lidocaine. A time-out was performed prior to the procedure.  The preexisting dialysis catheter was removed over a guidewire after freeing the subcutaneous cuff using blunt dissection.  A new Bard Hemosplit tunneled hemodialysis catheter measuring 19 cm from tip to cuff was chosen for placement. The venotomy was dilated with 14 [place] and 16 [place] dilators prior to advancing the new catheter. The tunneled catheter was advanced over the guidewire under fluoroscopy.  Final catheter positioning was confirmed and documented with a  fluoroscopic spot image. The catheter was aspirated, flushed with saline, and injected with appropriate volume heparin dwells.  COMPLICATIONS: None.  No pneumothorax.  FINDINGS: After removal of the pre-existing catheter, it was difficult to advance a new tunneled catheter through the venotomy, likely related to vein stenosis and fibrin material. The venotomy had to be dilated up to 16 [place] in order to allow advancement of a new tunneled catheter. After catheter placement, the tips lie in the right atrium. The catheter aspirates normally and is ready for immediate use.  IMPRESSION: Exchange of tunneled hemodialysis catheter via the right internal jugular vein. The catheter tips lie in the right atrium. The catheter is ready for immediate use.   Electronically Signed   By: Irish Lack M.D.   On: 10/31/2014 17:25   Ct Angio Chest Aorta W/cm &/or Wo/cm  11/23/2014  CLINICAL DATA:  Low blood pressure, lethargic. Mid abdominal pain. AAA.  EXAM: CT ANGIOGRAPHY CHEST, ABDOMEN AND PELVIS  TECHNIQUE: Multidetector CT imaging through the chest, abdomen and pelvis was performed using the standard protocol during bolus administration of intravenous contrast. Multiplanar reconstructed images and MIPs were obtained and reviewed to evaluate the vascular anatomy.  CONTRAST:  80mL OMNIPAQUE IOHEXOL 350 MG/ML SOLN  COMPARISON:  Noncontrast chest CT 11/22/2014. Abdominal and pelvic CT 10/28/2014.  FINDINGS: CTA CHEST FINDINGS  There is diffuse arterial megaly. Diffuse atherosclerotic calcifications throughout the aorta. Multi vessel Coronary artery calcifications, most pronounced in the left anterior descending and circumflex arteries.  The descending thoracic aorta is markedly tortuous. The distal descending thoracic aorta in the left lower chest measures 4.2 cm. The aorta swings to the right of midline where the aorta measures 4.6 cm. This is not significantly changed. There is extensive mural plaque within this tortuous  segment of distal descending thoracic aorta. No aneurysm.  2.1 cm spiculated nodule noted in the right lower lobe. When compared to study from 08/02/2014, this has not significantly changed in greatest diameter.  Scattered emphysematous changes in the lungs.  No pleural effusions.  Review of the MIP images confirms the above findings.  CTA ABDOMEN AND PELVIS FINDINGS  Complex abdominal aortic aneurysm. Diffuse peripheral calcifications throughout the aorta. Maximum aortic diameter measures 7.2 cm. Extensive mural plaque throughout the aneurysm. The aneurysm ends at the aortic bifurcation. Iliac vessels are calcified, non aneurysmal. Maximum aortic diameter is stable when compared to recent abdominal CT has enlarged since 10/08/2011 when the aneurysm measured approximately 6.3 cm in greatest diameter. No dissection.  Layering high density material within the gallbladder, likely gallstones. Liver, spleen, pancreas, right adrenal gland unremarkable. Mild diffuse fullness of the left adrenal gland, likely hyperplasia. No hydronephrosis or focal abnormality within the kidneys.  Sigmoid diverticulosis. No evidence of active diverticulitis. Stomach and small bowel are decompressed. No free fluid, free air or adenopathy.  No acute bony abnormality or focal bone lesion. Degenerative changes in the lumbar spine.  Review of the MIP images confirms the above findings.  IMPRESSION: Complex thoracoabdominal aortic aneurysm with marked tortuosity in the lower chest. Extensive mural plaque in the lower chest and abdominal component. No evidence of significant change since most recent studies. No evidence of dissection.  2.1 cm spiculated nodule in the right lower lobe, not significantly changed in overall size since 08/02/2014.  Cardiomegaly, coronary artery disease.  Cholelithiasis.  No acute findings in the chest, abdomen or pelvis.   Electronically Signed   By: Charlett Nose M.D.   On: 11/23/2014 12:36   Ct Angio Abd/pel W/  And/or W/o  11/23/2014   CLINICAL DATA:  Low blood pressure, lethargic. Mid abdominal pain. AAA.  EXAM: CT ANGIOGRAPHY CHEST, ABDOMEN AND PELVIS  TECHNIQUE: Multidetector CT imaging through the chest, abdomen and pelvis was performed using the standard protocol during bolus administration of intravenous contrast. Multiplanar reconstructed images and MIPs were obtained and reviewed to evaluate the vascular anatomy.  CONTRAST:  80mL OMNIPAQUE IOHEXOL 350 MG/ML SOLN  COMPARISON:  Noncontrast chest CT 11/22/2014. Abdominal and pelvic CT 10/28/2014.  FINDINGS: CTA CHEST FINDINGS  There is diffuse arterial megaly. Diffuse atherosclerotic calcifications throughout the aorta. Multi vessel Coronary artery calcifications, most pronounced in the left anterior descending and circumflex arteries.  The descending thoracic aorta is markedly tortuous. The distal descending thoracic aorta in the left lower chest measures 4.2 cm. The aorta swings to the right of midline  where the aorta measures 4.6 cm. This is not significantly changed. There is extensive mural plaque within this tortuous segment of distal descending thoracic aorta. No aneurysm.  2.1 cm spiculated nodule noted in the right lower lobe. When compared to study from 08/02/2014, this has not significantly changed in greatest diameter.  Scattered emphysematous changes in the lungs.  No pleural effusions.  Review of the MIP images confirms the above findings.  CTA ABDOMEN AND PELVIS FINDINGS  Complex abdominal aortic aneurysm. Diffuse peripheral calcifications throughout the aorta. Maximum aortic diameter measures 7.2 cm. Extensive mural plaque throughout the aneurysm. The aneurysm ends at the aortic bifurcation. Iliac vessels are calcified, non aneurysmal. Maximum aortic diameter is stable when compared to recent abdominal CT has enlarged since 10/08/2011 when the aneurysm measured approximately 6.3 cm in greatest diameter. No dissection.  Layering high density material  within the gallbladder, likely gallstones. Liver, spleen, pancreas, right adrenal gland unremarkable. Mild diffuse fullness of the left adrenal gland, likely hyperplasia. No hydronephrosis or focal abnormality within the kidneys.  Sigmoid diverticulosis. No evidence of active diverticulitis. Stomach and small bowel are decompressed. No free fluid, free air or adenopathy.  No acute bony abnormality or focal bone lesion. Degenerative changes in the lumbar spine.  Review of the MIP images confirms the above findings.  IMPRESSION: Complex thoracoabdominal aortic aneurysm with marked tortuosity in the lower chest. Extensive mural plaque in the lower chest and abdominal component. No evidence of significant change since most recent studies. No evidence of dissection.  2.1 cm spiculated nodule in the right lower lobe, not significantly changed in overall size since 08/02/2014.  Cardiomegaly, coronary artery disease.  Cholelithiasis.  No acute findings in the chest, abdomen or pelvis.   Electronically Signed   By: Charlett Nose M.D.   On: 11/23/2014 12:36    Microbiology: Recent Results (from the past 240 hour(s))  Urine culture     Status: None   Collection Time: 11/23/14  9:45 AM  Result Value Ref Range Status   Specimen Description URINE, CATHETERIZED  Final   Special Requests NONE  Final   Colony Count NO GROWTH Performed at [facility]   Final   Culture NO GROWTH Performed at [facility]   Final   Report Status 11/25/2014 FINAL  Final  Culture, blood (routine x 2)     Status: None   Collection Time: 11/23/14  2:45 PM  Result Value Ref Range Status   Specimen Description BLOOD HAND RIGHT  Final   Special Requests BOTTLES DRAWN AEROBIC AND ANAEROBIC 5CC  Final   Culture   Final    NO GROWTH 5 DAYS Performed at [facility]    Report Status 11/29/2014 FINAL  Final  Culture, blood (routine x 2)     Status: None   Collection Time: 11/23/14  3:00 PM  Result Value Ref  Range Status   Specimen Description BLOOD HAND RIGHT  Final   Special Requests BOTTLES DRAWN AEROBIC AND ANAEROBIC 5CC  Final   Culture   Final    NO GROWTH 5 DAYS Performed at [facility]    Report Status 11/29/2014 FINAL  Final  Clostridium Difficile by PCR     Status: Abnormal   Collection Time: 11/23/14  6:25 PM  Result Value Ref Range Status   C difficile by pcr POSITIVE (A) NEGATIVE Final    Comment: CRITICAL RESULT CALLED TO, READ BACK BY AND VERIFIED WITH: CHANEY,T RN @ 1247 11/24/14 LEONARD,A  Labs: Basic Metabolic Panel:  Recent Labs Lab 11/24/14 0352 11/25/14 0539 11/28/14 1400  NA 136 140 137  K 4.8 4.5 3.2*  CL 100 101 98  CO2 18* 17* 29  GLUCOSE 82 71 88  BUN 46* 54* 43*  CREATININE 7.00* 8.25* 6.79*  CALCIUM 9.5 9.7 8.3*  PHOS  --   --  6.2*   Liver Function Tests:  Recent Labs Lab 11/24/14 0352 11/28/14 1400  AST 24  --   ALT 14  --   ALKPHOS 66  --   BILITOT 0.8  --   PROT 6.0  --   ALBUMIN 2.3* 2.0*   No results for input(s): LIPASE, AMYLASE in the last 168 hours. No results for input(s): AMMONIA in the last 168 hours. CBC:  Recent Labs Lab 11/24/14 0352 11/25/14 0539 11/26/14 1115 11/26/14 1500 11/27/14 0549 11/28/14 1400  WBC 10.5 11.2* 7.3  --  5.8 5.5  HGB 12.3 11.7* 12.0  --  11.4* 10.5*  HCT 38.7 35.8* 36.0 37.2 35.4* 32.8*  MCV 92.6 89.9 90.0  --  90.3 91.4  PLT 68* 61* 39*  --  60* 65*   Cardiac Enzymes: No results for input(s): CKTOTAL, CKMB, CKMBINDEX, TROPONINI in the last 168 hours. BNP: BNP (last 3 results) No results for input(s): BNP in the last 8760 hours.  ProBNP (last 3 results) No results for input(s): PROBNP in the last 8760 hours.  CBG:  Recent Labs Lab 11/23/14 1641  GLUCAP 84       Signed:  VANN, JESSICA  [facility] 11/30/2014, 11:44 AM

## 2014-11-30 NOTE — Care Management Note (Signed)
CARE MANAGEMENT NOTE 11/30/2014  [name]   Account Number:  [id number]  Date Initiated:  11/28/2014  Documentation initiated by:  Johny ShockOYAL,Javarri Segal  Subjective/Objective Assessment:   CM following for progression and d/c planning     Action/Plan:   11/30/14 Pt will d/c back to SNF today. Pt went to HD in recliner today and tol.   Anticipated DC Date:  11/30/2014   Anticipated DC Plan:  SKILLED NURSING FACILITY         Choice offered to / List presented to:             Status of service:  Completed, signed off Medicare Important Message given?  YES (If response is "NO", the following Medicare IM given date fields will be blank) Date Medicare IM given:  11/28/2014 Medicare IM given by:  Tayanna Talford Date Additional Medicare IM given:   Additional Medicare IM given by:    Discharge Disposition:  SKILLED NURSING FACILITY  Per UR Regulation:    If discussed at Long Length of Stay Meetings, dates discussed:    Comments:

## 2014-12-02 DIAGNOSIS — I959 Hypotension, unspecified: Secondary | ICD-10-CM | POA: Diagnosis not present

## 2014-12-02 DIAGNOSIS — R0902 Hypoxemia: Secondary | ICD-10-CM | POA: Diagnosis not present

## 2014-12-05 DIAGNOSIS — R630 Anorexia: Secondary | ICD-10-CM | POA: Diagnosis not present

## 2014-12-21 DIAGNOSIS — J449 Chronic obstructive pulmonary disease, unspecified: Secondary | ICD-10-CM | POA: Diagnosis not present

## 2014-12-23 DEATH — deceased

## 2016-03-23 DEATH — deceased

## 2016-12-30 IMAGING — CT CT ABD-PELV W/O CM
2 of 4 series · 15 of 46 positions shown, 17 images · non-contrast
Comparison: CT 10/02/2014

CLINICAL DATA: Diarrhea for 3 weeks.  Rule out colitis.

EXAM:
CT ABDOMEN AND PELVIS WITHOUT CONTRAST
TECHNIQUE: Multidetector CT imaging of the abdomen and pelvis was performed
following the standard protocol without IV contrast.

[Series 2: abd/ pelvis 5.0 i30f 1 · axial · 0.68mm/px · z∈[+908,+1302]mm · 12 of 87 slices shown, 14 images]
[im 4/87  soft-tissue]
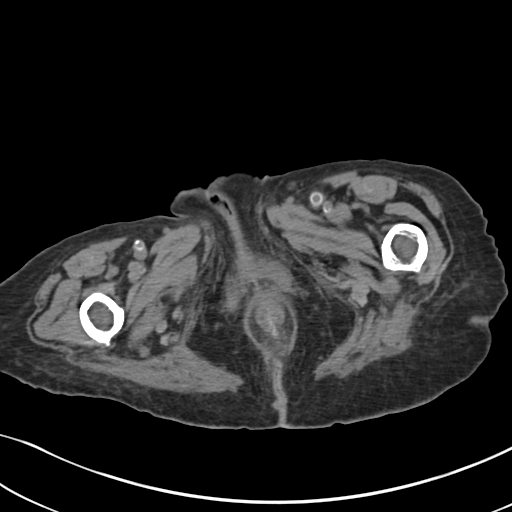
[im 4/87  bone]
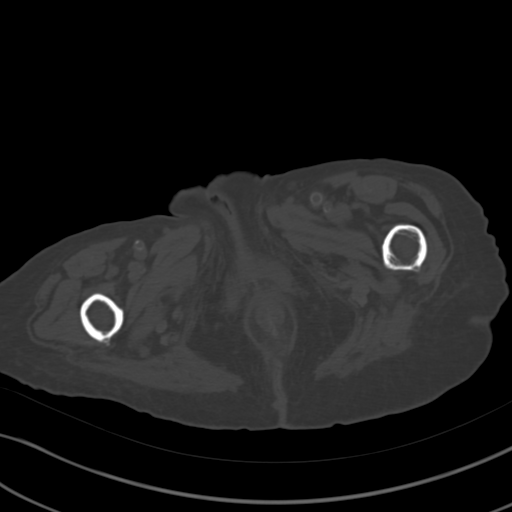
[im 12/87  soft-tissue]
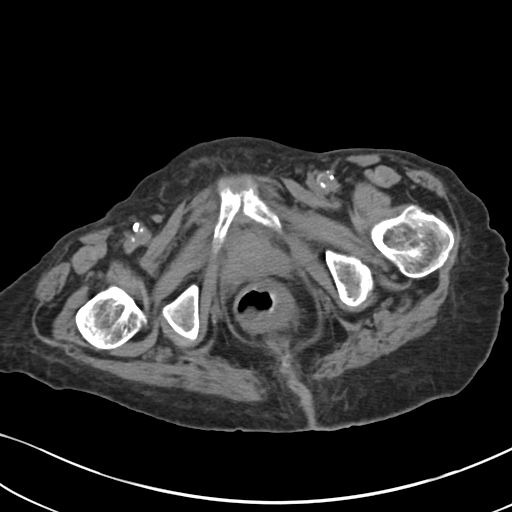
[im 19/87  soft-tissue]
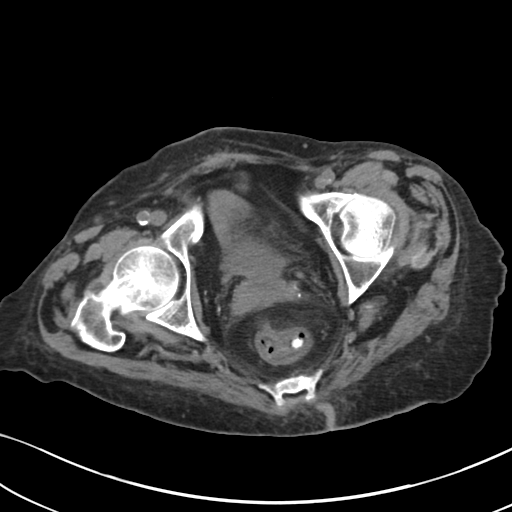
[im 27/87  soft-tissue]
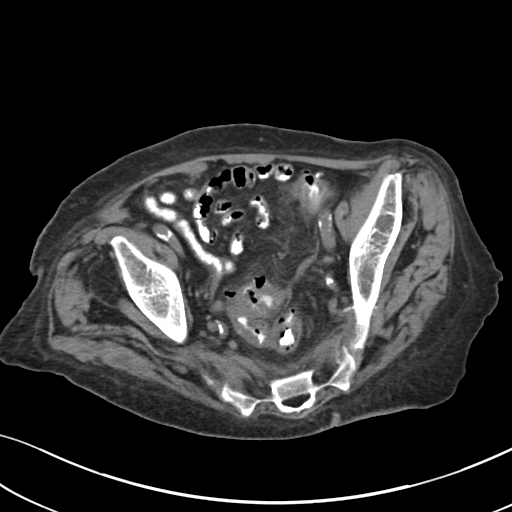
[im 34/87  soft-tissue]
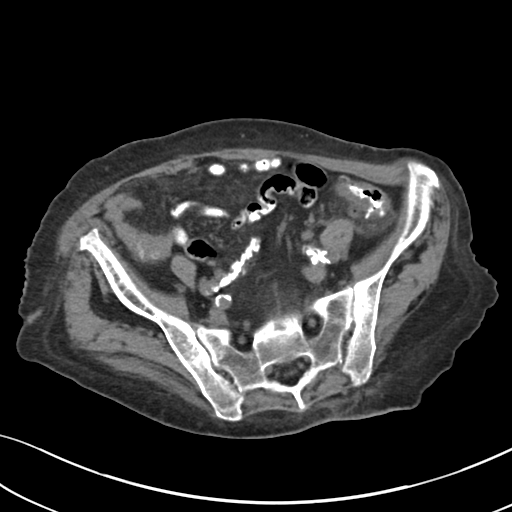
[im 42/87  soft-tissue]
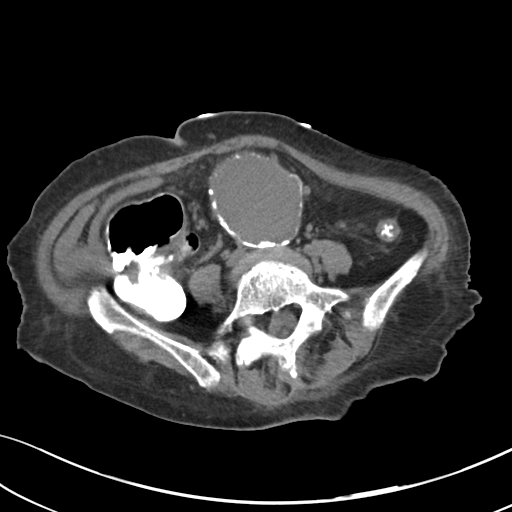
[im 45/87  soft-tissue]
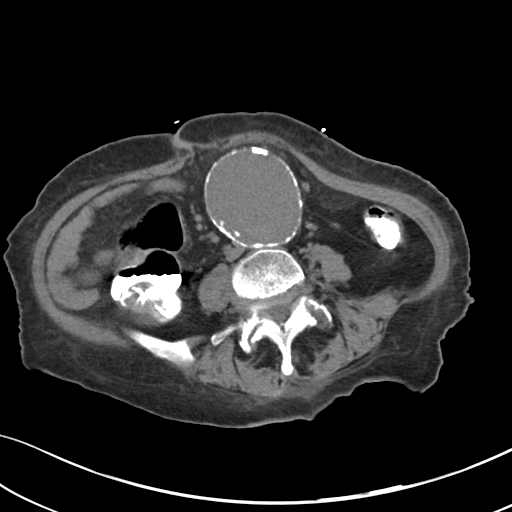
[im 53/87  soft-tissue]
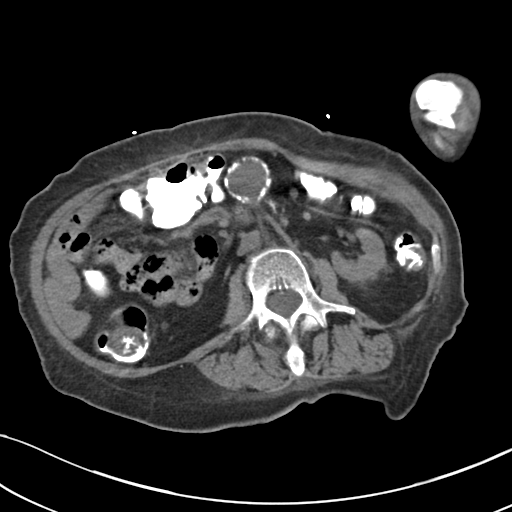
[im 60/87  soft-tissue]
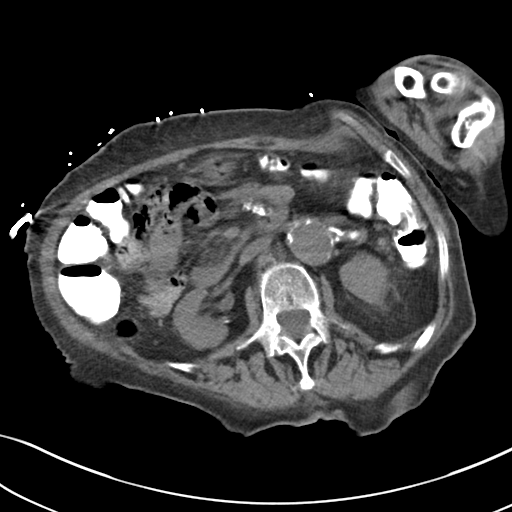
[im 60/87  bone]
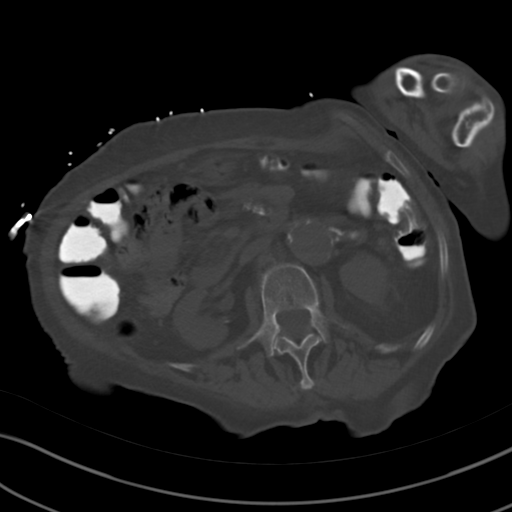
[im 68/87  soft-tissue]
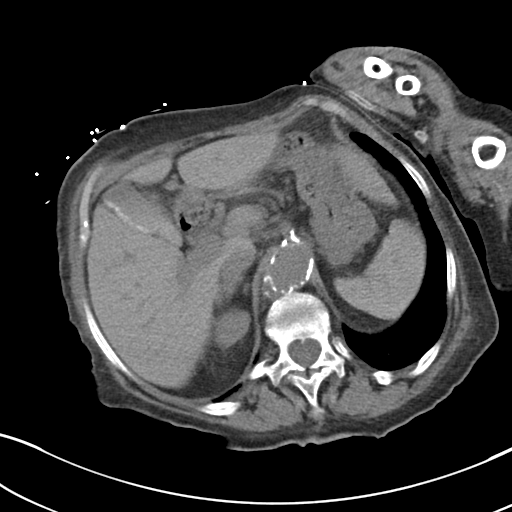
[im 75/87  soft-tissue]
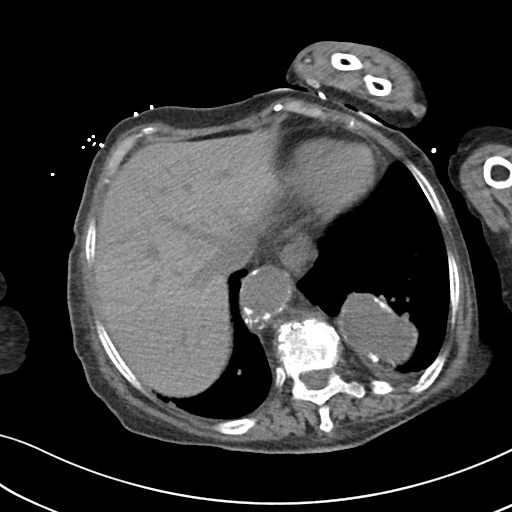
[im 83/87  soft-tissue]
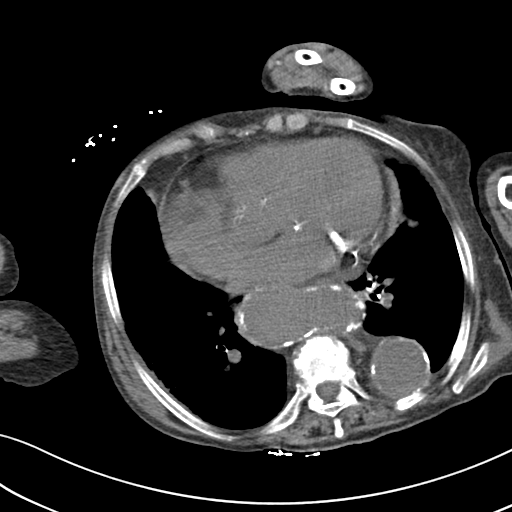

[Series 5: coronals · coronal · 0.65mm/px · 3 of 104 slices shown]
[im 35/104  soft-tissue]
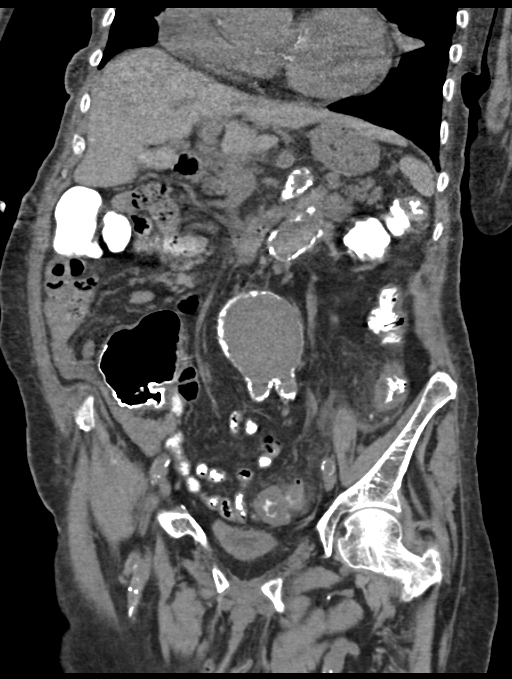
[im 46/104  soft-tissue]
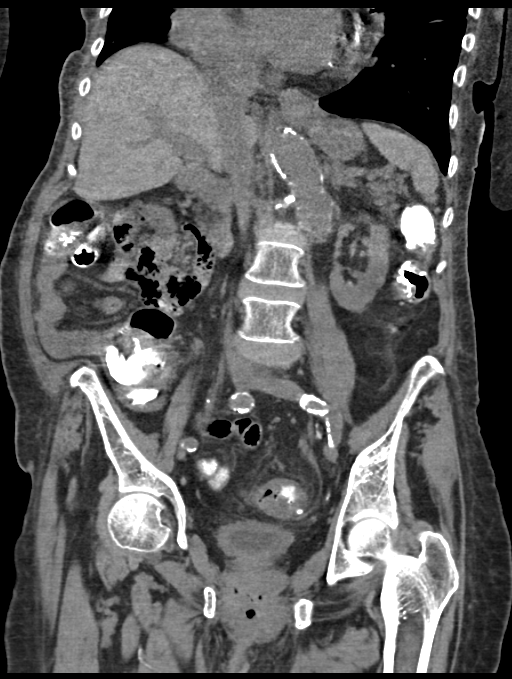
[im 58/104  soft-tissue]
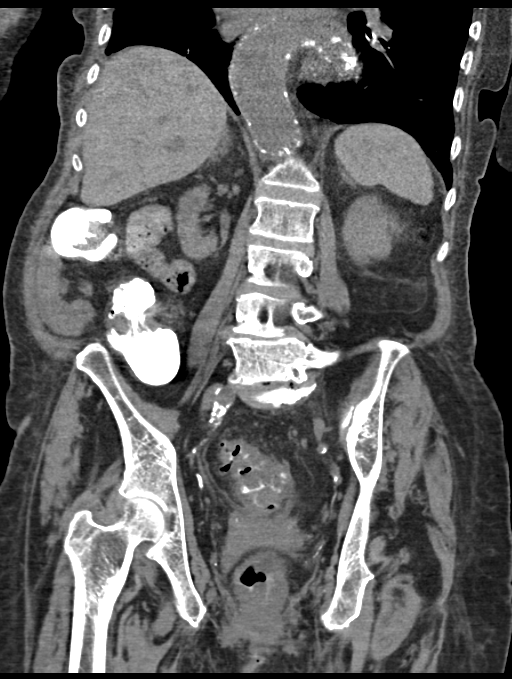

[15 of 46 positions shown; findings below may reference images not displayed]

FINDINGS: Chronic change at the lung bases. The heart is enlarged. There is a
significant tortuosity of the distal thoracic aorta that appears
similar to prior exam. No pleural effusion.

There is colonic wall thickening extending from the mid descending
colon through the rectosigmoid colon. Associated pericolonic
inflammatory change. Findings are consistent with colitis. No
pneumatosis or perforation. Multiple descending and sigmoid colonic
diverticula are again seen without peri-diverticular inflammatory
change to suggest diverticulitis. There is no small bowel
dilatation. The stomach is physiologically distended.

Layering gallstones within physiologically distended gallbladder.
The unenhanced liver is unremarkable. The unenhanced spleen,
pancreas, and adrenal glands are normal. There is renal atrophy
without hydronephrosis. The previous right renal cyst is not well
seen without contrast.

Large infrarenal abdominal aortic aneurysm appear similar to prior
exam. This measures 7.2 x 6.2 cm in biaxial dimension, however
differences in size are likely related to differences in technique.
There is no periaortic soft tissue stranding to suggest rupture.
Dense atherosclerosis throughout the abdominal aorta and its
branches again seen. No free air, free fluid, or intra-abdominal
fluid collection.

Within the pelvis the urinary bladder is decompressed not well
evaluated. The uterus not well seen, atrophic versus surgically
absent. There is no adnexal mass.

Anterolisthesis of L4 on L5 and L5 on S1 with associated
degenerative change, stable. Stable degenerative change of the pubic
symphysis. No acute osseous abnormalities.
IMPRESSION: 1. Colitis involving the descending and sigmoid colon. Findings
suggest an infectious or inflammatory etiology. There is
diverticulosis throughout the colon without peri-diverticular
inflammatory change to suggest diverticulitis.
2. Stable chronic findings include significant infrarenal abdominal
aortic aneurysm. Current maximal dimension of 7.2 cm is minimally
increased compared to prior where it measured 6.9 cm, however this
is likely related to differences in technique.
3. Cholelithiasis.
